# Patient Record
Sex: Female | Born: 1940
Health system: Southern US, Community
[De-identification: ages and names within clinical notes are randomized; demographics above are authoritative.]

## PROBLEM LIST (undated history)

## (undated) DIAGNOSIS — E079 Disorder of thyroid, unspecified: Secondary | ICD-10-CM

## (undated) DIAGNOSIS — E785 Hyperlipidemia, unspecified: Secondary | ICD-10-CM

## (undated) DIAGNOSIS — J439 Emphysema, unspecified: Secondary | ICD-10-CM

## (undated) DIAGNOSIS — M1990 Unspecified osteoarthritis, unspecified site: Secondary | ICD-10-CM

## (undated) DIAGNOSIS — J45909 Unspecified asthma, uncomplicated: Secondary | ICD-10-CM

## (undated) DIAGNOSIS — F419 Anxiety disorder, unspecified: Secondary | ICD-10-CM

## (undated) DIAGNOSIS — J449 Chronic obstructive pulmonary disease, unspecified: Secondary | ICD-10-CM

## (undated) DIAGNOSIS — T7840XA Allergy, unspecified, initial encounter: Secondary | ICD-10-CM

## (undated) HISTORY — DX: Disorder of thyroid, unspecified: E07.9

## (undated) HISTORY — DX: Unspecified asthma, uncomplicated: J45.909

## (undated) HISTORY — DX: Chronic obstructive pulmonary disease, unspecified: J44.9

## (undated) HISTORY — DX: Emphysema, unspecified: J43.9

## (undated) HISTORY — DX: Allergy, unspecified, initial encounter: T78.40XA

## (undated) HISTORY — DX: Hyperlipidemia, unspecified: E78.5

## (undated) HISTORY — PX: EYE SURGERY: SHX253

## (undated) HISTORY — DX: Anxiety disorder, unspecified: F41.9

## (undated) HISTORY — DX: Unspecified osteoarthritis, unspecified site: M19.90

---

## 2013-09-16 DIAGNOSIS — H02839 Dermatochalasis of unspecified eye, unspecified eyelid: Secondary | ICD-10-CM | POA: Insufficient documentation

## 2013-09-16 DIAGNOSIS — H0230 Blepharochalasis unspecified eye, unspecified eyelid: Secondary | ICD-10-CM | POA: Insufficient documentation

## 2019-07-23 DIAGNOSIS — J449 Chronic obstructive pulmonary disease, unspecified: Secondary | ICD-10-CM | POA: Insufficient documentation

## 2019-07-23 DIAGNOSIS — J432 Centrilobular emphysema: Secondary | ICD-10-CM | POA: Insufficient documentation

## 2019-07-23 DIAGNOSIS — K5790 Diverticulosis of intestine, part unspecified, without perforation or abscess without bleeding: Secondary | ICD-10-CM | POA: Insufficient documentation

## 2019-07-23 DIAGNOSIS — E7800 Pure hypercholesterolemia, unspecified: Secondary | ICD-10-CM | POA: Insufficient documentation

## 2019-07-23 DIAGNOSIS — H903 Sensorineural hearing loss, bilateral: Secondary | ICD-10-CM | POA: Insufficient documentation

## 2019-07-23 DIAGNOSIS — K635 Polyp of colon: Secondary | ICD-10-CM | POA: Insufficient documentation

## 2019-07-23 DIAGNOSIS — E559 Vitamin D deficiency, unspecified: Secondary | ICD-10-CM | POA: Insufficient documentation

## 2019-07-23 DIAGNOSIS — F419 Anxiety disorder, unspecified: Secondary | ICD-10-CM | POA: Insufficient documentation

## 2019-07-23 DIAGNOSIS — M1990 Unspecified osteoarthritis, unspecified site: Secondary | ICD-10-CM | POA: Insufficient documentation

## 2019-07-23 DIAGNOSIS — L853 Xerosis cutis: Secondary | ICD-10-CM | POA: Insufficient documentation

## 2019-07-23 DIAGNOSIS — Q438 Other specified congenital malformations of intestine: Secondary | ICD-10-CM | POA: Insufficient documentation

## 2019-07-23 DIAGNOSIS — Z87891 Personal history of nicotine dependence: Secondary | ICD-10-CM | POA: Insufficient documentation

## 2019-07-23 DIAGNOSIS — R7309 Other abnormal glucose: Secondary | ICD-10-CM | POA: Insufficient documentation

## 2019-07-23 DIAGNOSIS — J302 Other seasonal allergic rhinitis: Secondary | ICD-10-CM | POA: Insufficient documentation

## 2020-08-23 DIAGNOSIS — M25551 Pain in right hip: Secondary | ICD-10-CM | POA: Insufficient documentation

## 2021-03-08 ENCOUNTER — Other Ambulatory Visit: Payer: Self-pay

## 2021-03-08 ENCOUNTER — Ambulatory Visit (INDEPENDENT_AMBULATORY_CARE_PROVIDER_SITE_OTHER): Payer: PPO | Admitting: Medical-Surgical

## 2021-03-08 ENCOUNTER — Encounter: Payer: Self-pay | Admitting: Medical-Surgical

## 2021-03-08 VITALS — BP 146/73 | HR 76 | Temp 98.0°F | Resp 22 | Ht 66.0 in | Wt 154.0 lb

## 2021-03-08 DIAGNOSIS — J432 Centrilobular emphysema: Secondary | ICD-10-CM | POA: Diagnosis not present

## 2021-03-08 DIAGNOSIS — J4540 Moderate persistent asthma, uncomplicated: Secondary | ICD-10-CM | POA: Diagnosis not present

## 2021-03-08 DIAGNOSIS — F419 Anxiety disorder, unspecified: Secondary | ICD-10-CM

## 2021-03-08 DIAGNOSIS — Z7689 Persons encountering health services in other specified circumstances: Secondary | ICD-10-CM | POA: Diagnosis not present

## 2021-03-08 DIAGNOSIS — E890 Postprocedural hypothyroidism: Secondary | ICD-10-CM | POA: Diagnosis not present

## 2021-03-08 DIAGNOSIS — Z13820 Encounter for screening for osteoporosis: Secondary | ICD-10-CM | POA: Insufficient documentation

## 2021-03-08 MED ORDER — BUDESONIDE-FORMOTEROL FUMARATE 160-4.5 MCG/ACT IN AERO: 2.0000 | INHALATION_SPRAY | Freq: Two times a day (BID) | RESPIRATORY_TRACT | 12 refills | Status: DC

## 2021-03-08 NOTE — Progress Notes (Signed)
New Patient Office Visit  Subjective:  Patient ID: Christine Barry, female    DOB: 05-16-1941  Age: 80 y.o. MRN: [id number]  CC:  Chief Complaint  Patient presents with  . Establish Care    HPI Christine Barry presents to establish care.   History of partial thyroidectomy in her teens due to goiter. Was started on levothyroxine in 1987 and has been on it since. Currently taking 200mg  daily except for the 2nd and 4th Sunday of each month. Last check of her TSH in March of this year.   Emphysema/asthma- well controlled on Dulera and as needed Albuterol. Was followed by Pulmonology before her relocation and would like a new referral now that she lives here. Notes that her insurance here will not cover Dulera and would like to be switched back to Symbicort. She has used this before and it was effective.   Anxiety- takes 0.125mg  of Xanax sparingly for severe anxiety. Has some family issues that cause significant stress. Reports her son has mental health struggles and has attempted suicide several times. He is here with her and she is helping him with his affairs.   History reviewed. No pertinent past medical history.  History reviewed. No pertinent surgical history.  History reviewed. No pertinent family history.  Social History   Socioeconomic History  . Marital status: Widowed    Spouse name: Not on file  . Number of children: Not on file  . Years of education: Not on file  . Highest education level: Not on file  Occupational History  . Not on file  Tobacco Use  . Smoking status: Former Smoker    Types: Cigarettes  . Smokeless tobacco: Never Used  Substance and Sexual Activity  . Alcohol use: Not on file  . Drug use: Not on file  . Sexual activity: Not on file  Other Topics Concern  . Not on file  Social History Narrative  . Not on file   Social Determinants of Health   Financial Resource Strain: Not on file  Food Insecurity: Not on file  Transportation  Needs: Not on file  Physical Activity: Not on file  Stress: Not on file  Social Connections: Not on file  Intimate Partner Violence: Not on file    ROS Review of Systems  Constitutional: Negative for chills, fatigue, fever and unexpected weight change.  Respiratory: Positive for cough, chest tightness and shortness of breath. Negative for wheezing.   Cardiovascular: Negative for chest pain, palpitations and leg swelling.  Neurological: Negative for dizziness, light-headedness and headaches.  Psychiatric/Behavioral: Negative for dysphoric mood, self-injury, sleep disturbance and suicidal ideas. The patient is nervous/anxious.     Objective:   Today's Vitals: BP (!) 146/73 (BP Location: Left Arm, Patient Position: Sitting, Cuff Size: Normal)   Pulse 76   Temp 98 F (36.7 C) (Oral)   Resp (!) 22   Ht 5\' 6"  (1.676 m)   Wt 154 lb (69.9 kg)   SpO2 90%   BMI 24.86 kg/m   Physical Exam Vitals reviewed.  Constitutional:      General: She is not in acute distress.    Appearance: Normal appearance.  HENT:     Head: Normocephalic and atraumatic.  Cardiovascular:     Rate and Rhythm: Normal rate and regular rhythm.     Pulses: Normal pulses.     Heart sounds: Normal heart sounds. No murmur heard. No friction rub. No gallop.   Pulmonary:     Effort: Pulmonary effort  is normal. No respiratory distress.     Breath sounds: Normal breath sounds. No wheezing.  Skin:    General: Skin is warm and dry.  Neurological:     Mental Status: She is alert and oriented to person, place, and time.  Psychiatric:        Mood and Affect: Mood normal.        Behavior: Behavior normal.        Thought Content: Thought content normal.        Judgment: Judgment normal.     Assessment & Plan:   1. Encounter to establish care Reviewed available information and discussed care concerns with patient. She is up to date on preventative care and will be due for follow up in about 3 months.  2.  Centrilobular emphysema (HCC) 3. Moderate persistent asthma without complication Referring to pulmonology. New prescription sent in for Symbicort to replace [facility]. - Ambulatory referral to Pulmonology  4. Postoperative hypothyroidism Continue current levothyroxine dosing. Will be due for recheck in September.   5. Anxiety Continue sparing use of Xanax for severe anxiety. Discussed possibly referring to a counselor. She doesn't feel like she needs to do that at this point but will let me know once things settle down a bit.   Outpatient Encounter Medications as of 03/08/2021  Medication Sig  . albuterol (VENTOLIN HFA) 108 (90 Base) MCG/ACT inhaler Inhale 180 each into the lungs 4 (four) times daily as needed.  . ALPRAZolam (XANAX) 0.25 MG tablet Take 0.25 mg by mouth daily as needed.  . budesonide-formoterol (SYMBICORT) 160-4.5 MCG/ACT inhaler Inhale 2 puffs into the lungs in the morning and at bedtime.  . cetirizine (ZYRTEC) 10 MG tablet Take 10 mg by mouth daily as needed.  . Cholecalciferol 50 MCG (2000 UT) TABS Take 2,000 Units by mouth daily.  . cyanocobalamin 1000 MCG tablet Take 1,000 mcg by mouth 3 (three) times a week.  . EUTHYROX 200 MCG tablet Take 200 mcg by mouth in the morning and at bedtime. Take one tablet by mouth once daily in the morning before breakfast except for 2nd and 4th Sundays of the month.  . Ibuprofen 200 MG CAPS Take 200 mg by mouth daily as needed.  . simvastatin (ZOCOR) 20 MG tablet Take 20 mg by mouth at bedtime.  . [DISCONTINUED] DULERA 200-5 MCG/ACT AERO Inhale 2 puffs into the lungs in the morning and at bedtime.   No facility-administered encounter medications on file as of 03/08/2021.    Follow-up: Return in about 3 months (around 06/08/2021) for chronic disease follow up.   Thayer Ohm, DNP, APRN, FNP-BC [facility]

## 2021-04-11 ENCOUNTER — Ambulatory Visit (INDEPENDENT_AMBULATORY_CARE_PROVIDER_SITE_OTHER): Payer: PPO | Admitting: Medical-Surgical

## 2021-04-11 ENCOUNTER — Other Ambulatory Visit: Payer: Self-pay

## 2021-04-11 ENCOUNTER — Telehealth: Payer: Self-pay

## 2021-04-11 ENCOUNTER — Encounter: Payer: Self-pay | Admitting: Medical-Surgical

## 2021-04-11 VITALS — BP 134/64 | HR 89 | Temp 98.8°F | Ht 66.0 in | Wt 154.3 lb

## 2021-04-11 DIAGNOSIS — R0989 Other specified symptoms and signs involving the circulatory and respiratory systems: Secondary | ICD-10-CM | POA: Diagnosis not present

## 2021-04-11 NOTE — Progress Notes (Signed)
Subjective:    CC: Respiratory symptoms  HPI: Pleasant 80 year old female presenting today with complaints of upper respiratory symptoms that started about 5-6 days ago.  Notes that on Thursday, she woke up with a sore throat that persisted for 2 days.  After that she developed sinus congestion, cough, mild chest congestion, wheezing, achiness and fatigue.  She has had a low-grade temperature but no chills.  Also has had a runny nose.  She did test twice with a home COVID test with negative results.  She has a prescription for prednisone as well as azithromycin that she filled that was previously prescribed by her pulmonologist.  She did not want to start them until she had seen a provider.  Has a pulse ox at home and has been monitoring her oxygen levels with most of her readings greater than 90%.  Unfortunately, this morning she awoke with reading of 87%.  It did rebound into the 90s quickly and has been greater than 90 all day.  She has chronic mild shortness of breath and has not noticed any worsening.  Denies chest pain and GI symptoms.  I reviewed the past medical history, family history, social history, surgical history, and allergies today and no changes were needed.  Please see the problem list section below in epic for further details.  Past Medical History: Past Medical History:  Diagnosis Date   Asthma    COPD (chronic obstructive pulmonary disease) (HCC)    Hyperlipidemia    Thyroid disease    Past Surgical History: Past Surgical History:  Procedure Laterality Date   CESAREAN SECTION     Social History: Social History   Socioeconomic History   Marital status: Widowed    Spouse name: Not on file   Number of children: Not on file   Years of education: Not on file   Highest education level: Not on file  Occupational History   Occupation: Retired  Tobacco Use   Smoking status: Former    Packs/day: 2.00    Years: 38.00    Pack years: 76.00    Types: Cigarettes    Quit  date: 07/07/2000    Years since quitting: 20.7   Smokeless tobacco: Never  Vaping Use   Vaping Use: Never used  Substance and Sexual Activity   Alcohol use: Not on file   Drug use: Never   Sexual activity: Not Currently    Birth control/protection: Abstinence  Other Topics Concern   Not on file  Social History Narrative   Not on file   Social Determinants of Health   Financial Resource Strain: Not on file  Food Insecurity: Not on file  Transportation Needs: Not on file  Physical Activity: Not on file  Stress: Not on file  Social Connections: Not on file   Family History: History reviewed. No pertinent family history. Allergies: Allergies  Allergen Reactions   Influenza Vaccines Diarrhea and Other (See Comments)    Chills, redness at site, vomiting, diarrhea (occurred with 2013 flu shot)   Penicillins Other (See Comments)    Not sure (occurred in the 1970's), but thinks she "passed out"   Sulfa Antibiotics    Medications: See med rec.  Review of Systems: See HPI for pertinent positives and negatives.   Objective:    General: Well Developed, well nourished, and in no acute distress.  Neuro: Alert and oriented x3.  HEENT: Normocephalic, atraumatic.  Skin: Warm and dry. Cardiac: Regular rate and rhythm, no murmurs rubs or gallops, no lower  extremity edema.  Respiratory: Diffuse expiratory wheezes throughout all posterior lobes. Not using accessory muscles, speaking in full sentences.  Impression and Recommendations:    1. Respiratory symptoms Despite 2 home test, we will do a PCR COVID test.  Sending that off to the lab but results will not be available for a couple of days.  She is outside the 5-day symptom window so we will hold off on oral antivirals at this point.  She has the prescription for azithromycin as well as prednisone so encouraged her to go ahead and get that started.  Incentive spirometer provided and advised her to do deep breathing exercises regularly  throughout the day to help open her lungs.  Okay to use Tylenol/ibuprofen and over-the-counter medications to help with symptom management. - Novel Coronavirus, NAA ([facility])  Return in about 1 week (around 04/18/2021) for respiratory check in.  Okay to follow-up with pulmonology as scheduled but if unable to make that appointment, please come see me. ___________________________________________ Thayer Ohm, DNP, APRN, FNP-BC Primary Care and Sports Medicine [facility] [place]

## 2021-04-11 NOTE — Telephone Encounter (Signed)
Pt called and states she has a runny nose, lots of congestion, a cough, and has a hx of asthma. She is requesting an appt for evaluation and treatment. I called and LVM advising that she just needs to give our office a call back to schedule an appt with someone. Requested she call me back with any further questions or concerns.

## 2021-04-12 ENCOUNTER — Encounter: Payer: Self-pay | Admitting: Medical-Surgical

## 2021-04-12 LAB — NOVEL CORONAVIRUS, NAA: SARS-CoV-2, NAA: NOT DETECTED

## 2021-04-12 LAB — SARS-COV-2, NAA 2 DAY TAT

## 2021-04-12 LAB — SPECIMEN STATUS REPORT

## 2021-04-15 ENCOUNTER — Encounter: Payer: Self-pay | Admitting: Medical-Surgical

## 2021-04-17 ENCOUNTER — Other Ambulatory Visit: Payer: Self-pay

## 2021-04-17 ENCOUNTER — Encounter: Payer: Self-pay | Admitting: Medical-Surgical

## 2021-04-17 ENCOUNTER — Other Ambulatory Visit: Payer: Self-pay | Admitting: Medical-Surgical

## 2021-04-17 DIAGNOSIS — E890 Postprocedural hypothyroidism: Secondary | ICD-10-CM

## 2021-04-17 DIAGNOSIS — R0989 Other specified symptoms and signs involving the circulatory and respiratory systems: Secondary | ICD-10-CM

## 2021-04-17 MED ORDER — EUTHYROX 200 MCG PO TABS: ORAL_TABLET | ORAL | 0 refills | Status: DC

## 2021-04-17 MED ORDER — EUTHYROX 200 MCG PO TABS: 200.0000 ug | ORAL_TABLET | Freq: Two times a day (BID) | ORAL | 0 refills | Status: DC

## 2021-04-18 ENCOUNTER — Other Ambulatory Visit: Payer: Self-pay

## 2021-04-18 ENCOUNTER — Ambulatory Visit (INDEPENDENT_AMBULATORY_CARE_PROVIDER_SITE_OTHER): Payer: PPO

## 2021-04-18 DIAGNOSIS — R0989 Other specified symptoms and signs involving the circulatory and respiratory systems: Secondary | ICD-10-CM

## 2021-04-18 DIAGNOSIS — R059 Cough, unspecified: Secondary | ICD-10-CM

## 2021-04-19 ENCOUNTER — Ambulatory Visit: Payer: PPO | Admitting: Pulmonary Disease

## 2021-04-19 ENCOUNTER — Encounter: Payer: Self-pay | Admitting: Pulmonary Disease

## 2021-04-19 VITALS — BP 150/70 | HR 84 | Temp 97.7°F | Ht 66.0 in | Wt 154.0 lb

## 2021-04-19 DIAGNOSIS — J441 Chronic obstructive pulmonary disease with (acute) exacerbation: Secondary | ICD-10-CM | POA: Diagnosis not present

## 2021-04-19 MED ORDER — PREDNISONE 10 MG PO TABS: ORAL_TABLET | ORAL | 0 refills | Status: AC

## 2021-04-19 MED ORDER — IPRATROPIUM-ALBUTEROL 0.5-2.5 (3) MG/3ML IN SOLN: 3.0000 mL | RESPIRATORY_TRACT | 6 refills | Status: DC | PRN

## 2021-04-19 MED ORDER — DOXYCYCLINE HYCLATE 100 MG PO TABS: 100.0000 mg | ORAL_TABLET | Freq: Two times a day (BID) | ORAL | 0 refills | Status: AC

## 2021-04-19 NOTE — Patient Instructions (Addendum)
Severe COPD/asthma overlap --INCREASE Symbicort TWO puffs THREE times a day until we can obtain your nebulizer --When nebulizer arrives, resume Symbicort TWO puffs TWICE a day --START DUONEBs nebulizer 2-4 times day for shortness of breath or wheezing  --START second of prednisone --START doxycycline x 5 days --Continue Mucinex twice a day x 5 days. Samples provided  Please go to the ED if your oxygen level <88% for >5 minutes despite treatment.  Follow-up with me next week

## 2021-04-19 NOTE — Progress Notes (Signed)
Subjective:   PATIENT ID: Christine Barry GENDER: female DOB: July 05, 1941, MRN: [id number]   HPI  Chief Complaint  Patient presents with   Consult    Pt is being referred by PCP. Pt states that she currently has complaints of either bronchitis/asthma coughing up clear phlegm with some yellow tinge to it, wheezing, and SOB with exertion.    Reason for Visit: New consult for COPD  Christine Barry is a 80 year old female former smoker  She was diagnosed with COPD in 2016 and followed by a Pulmonogist, Dr. Wilma Flavin in [place]. She moved to be near her daughter in [place], [place]. At baseline, she is active and enjoys going to the farmer's market weekly. Denies coughing shortness of breath and wheezing. She is compliant with her bronchodilators with Dulera and rarely used her albuterol. In 2021 she had a prolonged exacerbation requiring 2-3 months of steroids.  Two weeks she developed sore throat followed by nasal congestion and chest congestion. She was seen by her PCP one week after her symptoms. She has tested negative for COVID-19 x 3 including PCR test in-clinic. She was treated with prednisone and azithromycin which was started on the 7/5 and despite treatment she continues to have cough, wheezing and shortness of breath. Cough is occasionally productive with yellowish brown sputum. Difficulty sleeping. Currently on Symbicort twice a day. Uses albuterol 3-4 times a day. Denies fevers, hemoptysis. Has been taking mucinex as needed.  Social History: Former smoker. Quit in 2001. 76 pack-years  I have personally reviewed patient's past medical/family/social history, allergies, current medications.  Past Medical History:  Diagnosis Date   Asthma    COPD (chronic obstructive pulmonary disease) (HCC)    Hyperlipidemia    Thyroid disease      Family History  Problem Relation Age of Onset   Lung disease Father      Social History   Occupational History   Occupation: Retired   Tobacco Use   Smoking status: Former    Packs/day: 2.00    Years: 38.00    Pack years: 76.00    Types: Cigarettes    Quit date: 07/07/2000    Years since quitting: 20.7   Smokeless tobacco: Never  Vaping Use   Vaping Use: Never used  Substance and Sexual Activity   Alcohol use: Not on file   Drug use: Never   Sexual activity: Not Currently    Birth control/protection: Abstinence    Allergies  Allergen Reactions   Influenza Vaccines Diarrhea and Other (See Comments)    Chills, redness at site, vomiting, diarrhea (occurred with 2013 flu shot)   Penicillins Other (See Comments)    Not sure (occurred in the 1970's), but thinks she "passed out"   Sulfa Antibiotics      Outpatient Medications Prior to Visit  Medication Sig Dispense Refill   albuterol (VENTOLIN HFA) 108 (90 Base) MCG/ACT inhaler Inhale 180 each into the lungs 4 (four) times daily as needed.     budesonide-formoterol (SYMBICORT) 160-4.5 MCG/ACT inhaler Inhale 2 puffs into the lungs in the morning and at bedtime. 1 each 12   cetirizine (ZYRTEC) 10 MG tablet Take 10 mg by mouth daily as needed.     Cholecalciferol 50 MCG (2000 UT) TABS Take 2,000 Units by mouth daily.     cyanocobalamin 1000 MCG tablet Take 1,000 mcg by mouth 3 (three) times a week.     EUTHYROX 200 MCG tablet Take one tablet by mouth once daily in  the morning before breakfast except for 2nd and 4th Sundays of the month. 180 tablet 0   Ibuprofen 200 MG CAPS Take 200 mg by mouth daily as needed.     simvastatin (ZOCOR) 20 MG tablet Take 20 mg by mouth at bedtime.     No facility-administered medications prior to visit.    Review of Systems  Constitutional:  Negative for chills, diaphoresis, fever, malaise/fatigue and weight loss.  HENT:  Positive for congestion. Negative for ear pain and sore throat.   Respiratory:  Positive for cough, sputum production, shortness of breath and wheezing. Negative for hemoptysis.   Cardiovascular:  Negative for  chest pain, palpitations and leg swelling.  Gastrointestinal:  Negative for abdominal pain, heartburn and nausea.  Genitourinary:  Negative for frequency.  Musculoskeletal:  Negative for joint pain and myalgias.  Skin:  Negative for itching and rash.  Neurological:  Negative for dizziness, weakness and headaches.  Endo/Heme/Allergies:  Does not bruise/bleed easily.  Psychiatric/Behavioral:  Negative for depression. The patient is not nervous/anxious.     Objective:   Vitals:   04/19/21 1110  BP: (!) 150/70  Pulse: 84  Temp: 97.7 F (36.5 C)  TempSrc: Oral  SpO2: 93%  Weight: 154 lb (69.9 kg)  Height: 5\' 6"  (1.676 m)      Physical Exam: General: Well-appearing, no acute distress HENT: [place], A Eyes: EOMI, no scleral icterus Respiratory: Clear to auscultation bilaterally.  No crackles, wheezing or rales Cardiovascular: RRR, -M/R/G, no JVD GI: BS+, soft, nontender Extremities:-Edema,-tenderness Neuro: AAO x4, CNII-XII grossly intact Skin: Intact, no rashes or bruising Psych: Normal mood, normal affect  Data Reviewed:  Imaging: CXR 04/18/21 - No acute infiltrate, effusion or edema  PFT: OSH 2019 FEV1 0.8. TGV 174% DLCO 49%. Positive bronchodilator response. Interpretation: Severe obstructive defect with hyperinflation and reduced gas exchange consistent with emphysema.  03/18/2020 spirometry: FEV1 0.54, 24%, FVC 1.3, ratio 0.41.   12/26/2020: FEV1 0.6, 25%, FVC 1.3, ratio 0.44.  Interpretation: Very severe obstructive defect   Labs: OSH 2016 alpha-1 MS phenotype with a level of 135  OSH 01/06/20 Absolute eos 200  Assessment & Plan:   Discussion:  Severe COPD/asthma overlap --INCREASE Symbicort TWO puffs THREE times a day until we can obtain your nebulizer --When nebulizer arrives, resume Symbicort TWO puffs TWICE a day --START DUONEBs nebulizer 2-4 times day for shortness of breath or wheezing  --START second of prednisone --START doxycycline x 5 days --Continue  Mucinex twice a day x 5 days  Please go to the ED if your oxygen level <88% for >5 minutes despite treatment.  Health Maintenance Immunization History  Administered Date(s) Administered   Influenza Split 08/02/2010, 08/09/2016, 07/15/2017, 07/18/2018   Influenza, High Dose Seasonal PF 07/07/2015, 08/10/2016, 07/23/2019   Influenza, Quadrivalent, Recombinant, Inj, Pf 07/18/2018   Influenza-Unspecified 06/26/2011, 07/21/2020   PFIZER(Purple Top)SARS-COV-2 Vaccination 11/02/2019, 11/23/2019, 07/05/2020   Pneumococcal Conjugate-13 10/08/2005, 06/21/2014, 07/18/2018, 07/22/2018   Pneumococcal Polysaccharide-23 06/04/2019   Pneumococcal-Unspecified 10/08/2005   Tdap 03/01/2018   Zoster Recombinat (Shingrix) 12/16/2017, 05/05/2018   Zoster, Live 08/02/2011   CT Lung Screen - discuss at next visit  Orders Placed This Encounter  Procedures   AMB REFERRAL FOR DME    Referral Priority:   Routine    Referral Type:   Durable Medical Equipment Purchase    Number of Visits Requested:   1   Meds ordered this encounter  Medications   predniSONE (DELTASONE) 10 MG tablet    Sig: Take 4 tablets (  40 mg total) by mouth daily with breakfast for 2 days, THEN 3 tablets (30 mg total) daily with breakfast for 2 days, THEN 2 tablets (20 mg total) daily with breakfast for 2 days, THEN 1 tablet (10 mg total) daily with breakfast for 2 days.    Dispense:  20 tablet    Refill:  0   doxycycline (VIBRA-TABS) 100 MG tablet    Sig: Take 1 tablet (100 mg total) by mouth 2 (two) times daily for 5 days.    Dispense:  10 tablet    Refill:  0   ipratropium-albuterol (DUONEB) 0.5-2.5 (3) MG/3ML SOLN    Sig: Inhale 3 mLs into the lungs every 4 (four) hours as needed.    Dispense:  360 mL    Refill:  6    J44.1    Return in about 3 months (around 07/20/2021).  I have spent a total time of 45-minutes on the day of the appointment reviewing prior documentation, coordinating care and discussing medical diagnosis and  plan with the patient/family. Imaging, labs and tests included in this note have been reviewed and interpreted independently by me.  Sallie Staron Mechele Collin, MD [facility] 04/19/2021 10:37 AM  Office Number [phone]

## 2021-04-20 ENCOUNTER — Telehealth: Payer: Self-pay | Admitting: *Deleted

## 2021-04-20 DIAGNOSIS — J441 Chronic obstructive pulmonary disease with (acute) exacerbation: Secondary | ICD-10-CM | POA: Diagnosis not present

## 2021-04-20 NOTE — Telephone Encounter (Addendum)
04/21/2021: Unable to locate PA started yesterday with Key [id number]  Attempted to start a new PA and received this message:  Brookelynne Howeth Key: [id number] - PA Case ID: [id number]  Additional Information Required There is an existing case within the [facility] environment that has the same patient, prescriber, and drug. This case must be finalized before proceeding with similar requests. Drug Ipratropium-Albuterol 0.5-2.5 (3)MG/3ML solution Form [employer] 4-Part Electronic PA Form [phone] NCPDP)  Unsure what is going on with CMM right now.  04/20/2021: PA request was received from (pharmacy): [facility] [phone] Fax: [phone] Medication name and strength: Ipratropium-Albuterol 0.5-2.5 (3)mg/28ml solution Ordering Provider: Everardo All  Was PA started with [facility]?: yes If yes, please enter KEY: [id number] Medication tried and failed: none Covered Alternatives: unknown  PA sent to plan, time frame for approval / denial: unknown Routing to Triage for follow-up

## 2021-04-21 NOTE — Telephone Encounter (Signed)
ATC, left VM for callback. 

## 2021-04-21 NOTE — Telephone Encounter (Signed)
Unable to check status of PA via CMM. No matching record with below key.  Heather, do you have the key?

## 2021-04-24 NOTE — Telephone Encounter (Signed)
Called insurance company to inquire why PA was denied.  I was transferred 3 times and never spoke with anyone to get an answer.  Will attempt another time.

## 2021-04-24 NOTE — Telephone Encounter (Signed)
Chatted with covermymeds and got it figured out, the key is [id number]. The PA was denied. Deniedon July 15 PA Case: [id number], Status: Denied. Notification: Completed.  No reason for denial, will have to contact insurance company for reason for denial.

## 2021-04-25 ENCOUNTER — Ambulatory Visit (INDEPENDENT_AMBULATORY_CARE_PROVIDER_SITE_OTHER): Payer: PPO | Admitting: Pulmonary Disease

## 2021-04-25 ENCOUNTER — Encounter: Payer: Self-pay | Admitting: Pulmonary Disease

## 2021-04-25 ENCOUNTER — Other Ambulatory Visit: Payer: Self-pay

## 2021-04-25 VITALS — BP 138/68 | HR 86 | Ht 66.0 in | Wt 153.4 lb

## 2021-04-25 DIAGNOSIS — J441 Chronic obstructive pulmonary disease with (acute) exacerbation: Secondary | ICD-10-CM | POA: Diagnosis not present

## 2021-04-25 MED ORDER — PREDNISONE 10 MG PO TABS: ORAL_TABLET | ORAL | 0 refills | Status: AC

## 2021-04-25 NOTE — Progress Notes (Signed)
Subjective:   PATIENT ID: Christine Barry GENDER: female DOB: 01-30-1941, MRN: [id number]   HPI  Chief Complaint  Patient presents with   COPD    Still having cough doesn't seem to think medication working good feel like she may need another dose of prednisone. Shortness of breath.    Reason for Visit: Follow-up COPD  [name] is a 80 year old female former smoker  Synopsis: She was diagnosed with COPD in 2016 and followed by a Pulmonogist, Dr. Wilma Barry in [place]. She moved to be near her daughter in [place], [place]. At baseline, she is active and enjoys going to the farmer's market weekly. Denies coughing shortness of breath and wheezing. She is compliant with her bronchodilators with Dulera and rarely used her albuterol. In 2021 she had a prolonged exacerbation requiring 2-3 months of steroids.  04/25/21 She established care at [facility] in 04/2021 and started treatment for COPD exacerbation. COVID-19 neg x 3 prior to her appointment. On one week follow-up today she reports one to two good days but has persistent shortness of breath, wheezing cough. During the prednisone taper her symptom seem to have recurred. Symptoms are worse in the mornings. After nebulizer treatments she will have a productive cough. Her oxygen levels have dropped to 89% at home with activity. Does not have supplemental O2. She has been compliant with Duonebs QID and symbicort twice daily.   Social History: Former smoker. Quit in 2001. 76 pack-years  Past Medical History:  Diagnosis Date   Asthma    COPD (chronic obstructive pulmonary disease) (HCC)    Hyperlipidemia    Thyroid disease      Social History   Occupational History   Occupation: Retired  Tobacco Use   Smoking status: Former    Packs/day: 2.00    Years: 38.00    Pack years: 76.00    Types: Cigarettes    Start date: 10/08/1961    Quit date: 07/07/2000    Years since quitting: 20.8   Smokeless tobacco: Never   Vaping Use   Vaping Use: Never used  Substance and Sexual Activity   Alcohol use: Not on file   Drug use: Never   Sexual activity: Not Currently    Birth control/protection: Abstinence    Allergies  Allergen Reactions   Influenza Vaccines Diarrhea and Other (See Comments)    Chills, redness at site, vomiting, diarrhea (occurred with 2013 flu shot)   Penicillins Other (See Comments)    Not sure (occurred in the 1970's), but thinks she "passed out"   Sulfa Antibiotics      Outpatient Medications Prior to Visit  Medication Sig Dispense Refill   albuterol (VENTOLIN HFA) 108 (90 Base) MCG/ACT inhaler Inhale 180 each into the lungs 4 (four) times daily as needed.     ALPRAZolam (XANAX) 0.25 MG tablet Take 0.25 mg by mouth at bedtime as needed for anxiety.     budesonide-formoterol (SYMBICORT) 160-4.5 MCG/ACT inhaler Inhale 2 puffs into the lungs in the morning and at bedtime. 1 each 12   cetirizine (ZYRTEC) 10 MG tablet Take 10 mg by mouth daily as needed.     Cholecalciferol 50 MCG (2000 UT) TABS Take 2,000 Units by mouth daily.     cyanocobalamin 1000 MCG tablet Take 1,000 mcg by mouth 3 (three) times a week.     EUTHYROX 200 MCG tablet Take one tablet by mouth once daily in the morning before breakfast except for 2nd and 4th Sundays of  the month. 180 tablet 0   Ibuprofen 200 MG CAPS Take 200 mg by mouth daily as needed.     ipratropium-albuterol (DUONEB) 0.5-2.5 (3) MG/3ML SOLN Inhale 3 mLs into the lungs every 4 (four) hours as needed. 360 mL 6   predniSONE (DELTASONE) 10 MG tablet Take 4 tablets (40 mg total) by mouth daily with breakfast for 2 days, THEN 3 tablets (30 mg total) daily with breakfast for 2 days, THEN 2 tablets (20 mg total) daily with breakfast for 2 days, THEN 1 tablet (10 mg total) daily with breakfast for 2 days. 20 tablet 0   predniSONE (STERAPRED UNI-PAK 21 TAB) 10 MG (21) TBPK tablet Take by mouth daily. Take 4tabs once daily x5 days, then 3tabs x5 days, then  2tabs x5 days, then 1tab x5 days, then stop     simvastatin (ZOCOR) 20 MG tablet Take 20 mg by mouth at bedtime.     No facility-administered medications prior to visit.    Review of Systems  Constitutional:  Negative for chills, diaphoresis, fever, malaise/fatigue and weight loss.  HENT:  Negative for congestion.   Respiratory:  Positive for cough, shortness of breath and wheezing. Negative for hemoptysis and sputum production.   Cardiovascular:  Negative for chest pain, palpitations and leg swelling.    Objective:   Vitals:   04/25/21 1219  BP: 138/68  Pulse: 86  SpO2: 91%  Weight: 153 lb 6.4 oz (69.6 kg)  Height: 5\' 6"  (1.676 m)   SpO2: 91 %  Physical Exam: General: Frail-appearing, no acute distress HENT: [place], AT Eyes: EOMI, no scleral icterus Respiratory: Diminished breath sounds bilaterally.  No crackles, wheezing or rales. Pursed lip breathing Cardiovascular: RRR, -M/R/G, no JVD Extremities:-Edema,-tenderness Neuro: AAO x4, CNII-XII grossly intact Psych: Normal mood, normal affect  Data Reviewed:  Imaging: CXR 04/18/21 - No acute infiltrate, effusion or edema  PFT: OSH 2019 FEV1 0.8. TGV 174% DLCO 49%. Positive bronchodilator response. Interpretation: Severe obstructive defect with hyperinflation and reduced gas exchange consistent with emphysema.  03/18/2020 spirometry: FEV1 0.54, 24%, FVC 1.3, ratio 0.41.   12/26/2020: FEV1 0.6, 25%, FVC 1.3, ratio 0.44.  Interpretation: Very severe obstructive defect   Labs: OSH 2016 alpha-1 MS phenotype with a level of 135  OSH 01/06/20 Absolute eos 200  Assessment & Plan:   Discussion: 79 year old female with very severe COPD (FEV1 25%) who presents with COPD exacerbation. Initially improved with bronchodilator changes and prednisone but now worsening. In-clinic ambulatory O2 with desaturations to 87% on room air. Discussed with patient ED visit however she is concerned about leaving her adult son (recent  anxiety/nervous breakdown). Will arrange for urgent oxygen delivery however I discussed the risks of delaying care. She agrees to present to ED for evaluation for worsening respiratory symptoms or oxygenation. Her son is driving her home today  Severe COPD/asthma overlap with exacerbation ---START Duonebs every 4 hours while awake  (Ex. 7:30 AM, 11:30AM, 3:30 PM, 7:30 PM, before bed) --START Flutter valve after nebulizer treatments --RESTART 3rd course of prednisone --CONTINUE Symbicort TWO puffs twice a day --CONTINUE mucinex  Go to the ED for evaluation for worsening respiratory symptoms or oxygenation.  Health Maintenance Immunization History  Administered Date(s) Administered   Influenza Split 08/02/2010, 08/09/2016, 07/15/2017, 07/18/2018   Influenza, High Dose Seasonal PF 07/07/2015, 08/10/2016, 07/23/2019   Influenza, Quadrivalent, Recombinant, Inj, Pf 07/18/2018   Influenza-Unspecified 06/26/2011, 07/21/2020   PFIZER(Purple Top)SARS-COV-2 Vaccination 11/02/2019, 11/23/2019, 07/05/2020   Pneumococcal Conjugate-13 10/08/2005, 06/21/2014, 07/18/2018, 07/22/2018  Pneumococcal Polysaccharide-23 06/04/2019   Pneumococcal-Unspecified 10/08/2005   Tdap 03/01/2018   Zoster Recombinat (Shingrix) 12/16/2017, 05/05/2018   Zoster, Live 08/02/2011   CT Lung Screen - discuss at next visit  Orders Placed This Encounter  Procedures   AMB REFERRAL FOR DME    Referral Priority:   Urgent    Referral Type:   Durable Medical Equipment Purchase    Number of Visits Requested:   1   Flutter valve    Standing Status:   Future    Standing Expiration Date:   04/25/2022   Meds ordered this encounter  Medications   predniSONE (DELTASONE) 10 MG tablet    Sig: Take 4 tablets (40 mg total) by mouth daily with breakfast for 2 days, THEN 3 tablets (30 mg total) daily with breakfast for 2 days, THEN 2 tablets (20 mg total) daily with breakfast for 2 days, THEN 1 tablet (10 mg total) daily with  breakfast for 2 days.    Dispense:  20 tablet    Refill:  0    Return in about 1 week (around 05/02/2021).  Nichola Cieslinski Mechele Collin, MD [facility] 04/25/2021 1:15 PM  Office Number [phone]

## 2021-04-25 NOTE — Telephone Encounter (Signed)
Per denial letter Iprat-albut 0.5-3(2.5) MG/3ML was denied under Medicare Part D.  However, coverage has been approved under Medicare Part A/B.  Pharmacy informed and asked to process.

## 2021-04-25 NOTE — Telephone Encounter (Signed)
Called [facility] to ask about Duoned RX and spoke with [place]. Told her we did PA and it was Denied under Medicare part D but approved under Medicare part A/B and asked if they can run it under A/B. Was informed that prescription was picked up already and patient only had to pay $5. Expressed understanding. Nothing further needed at this time.

## 2021-04-25 NOTE — Patient Instructions (Addendum)
--  START Duonebs every 4 hours while awake  (Ex. 7:30 AM, 11:30AM, 3:30 PM, 7:30 PM, before bed) --START Flutter valve after nebulizer treatments --RESTART 3rd course of prednisone --CONTINUE Symbicort TWO puffs twice a day --CONTINUE mucinex  Go to the ED for evaluation for worsening respiratory symptoms or oxygenation.  Follow-up with NP in one week

## 2021-05-02 ENCOUNTER — Telehealth: Payer: Self-pay

## 2021-05-02 NOTE — Telephone Encounter (Signed)
Dr. Maple Hudson is willing to see pt for 1 week f/u r/t last appointment with Dr. Everardo All. We did not have any NP opening in office for the rest of the week. Pt agreed to see Dr. Maple Hudson at 10am on 05/03/21.  Routing to Dr. Everardo All as Lorain Childes. Nothing further needed at this time

## 2021-05-02 NOTE — Progress Notes (Signed)
05/03/21- 80yoF former smoker (76 pk yrs)followed by Dr Everardo All for COPD last seen 04/25/21 and now seen for exacerbation in Dr George Hugh absence. Medical problem list includes Allergic Rhinitis, Moderate Persistent Asthma, COPD, Colon Polyp, Postoperative Hypothyroid,  Meds include- Ventolin hfa, Symbicort 160, Neb Duoneb, Prednisone taper from 40 mg started 7/19 O2 2L sleep/ [employer]                   Flutter valve 5x/ day Covid vax- 3 Phizer Onset URI/ Bronchitis symptoms early July, repeated neg Covid test. Rxd then Zith/ pred. ----pt.-sob better, no cough. Feeling 90% better since last wk. Her previous pulmonologist in ?[place] had suggested Trilogy. Feeling much better from recent acute exacerbation. Continues O2 2L for sleep- is comfortable and seems to help sleep better.  CXR 04/18/21-  IMPRESSION: Mild scarring or atelectasis at the lung bases  ROS-see HPI   + = positive Constitutional:    weight loss, night sweats, fevers, chills, fatigue, lassitude. HEENT:    headaches, difficulty swallowing, tooth/dental problems, sore throat,       sneezing, itching, ear ache, nasal congestion, post nasal drip, snoring CV:    chest pain, orthopnea, PND, swelling in lower extremities, anasarca,               dizziness, palpitations Resp:   +shortness of breath with exertion or at rest.                productive cough,   non-productive cough, coughing up of blood.              change in color of mucus.  +wheezing.   Skin:    rash or lesions. GI:  No-   heartburn, indigestion, abdominal pain, nausea, vomiting, diarrhea,                 change in bowel habits, loss of appetite GU: dysuria, change in color of urine, no urgency or frequency.   flank pain. MS:   joint pain, stiffness, decreased range of motion, back pain. Neuro-     nothing unusual Psych:  change in mood or affect.  depression or anxiety.   memory loss.  OBJ- Physical Exam General- Alert, Oriented, Affect-appropriate, Distress- none  acute Skin- rash-none, lesions- none, excoriation- none Lymphadenopathy- none Head- atraumatic            Eyes- Gross vision intact, PERRLA, conjunctivae and secretions clear            Ears- Hearing, canals-normal            Nose- Clear, no-Septal dev, mucus, polyps, erosion, perforation             Throat- Mallampati II , mucosa clear , drainage- none, tonsils- atrophic Neck- flexible , trachea midline, no stridor , +thyroid scar, carotid no bruit Chest - symmetrical excursion , unlabored           Heart/CV- RRR , no murmur , no gallop  , no rub, nl s1 s2                           - JVD- none , edema- none, stasis changes- none, varices- none           Lung- wheeze+expiratory, cough- none , dullness-none, rub- none                            +mildly breathless  while talking. Room air sat here 94%.           Chest wall-  Abd-  Br/ Gen/ Rectal- Not done, not indicated Extrem- cyanosis- none, clubbing, none, atrophy- none, strength- nl Neuro- grossly intact to observation

## 2021-05-02 NOTE — Telephone Encounter (Signed)
Notified pt that she was not scheduled for appointment today and instructed pt to call office to schedule appointment for next week. Nothing further needed at this time.

## 2021-05-03 ENCOUNTER — Other Ambulatory Visit: Payer: Self-pay

## 2021-05-03 ENCOUNTER — Encounter: Payer: Self-pay | Admitting: Internal Medicine

## 2021-05-03 ENCOUNTER — Ambulatory Visit: Payer: PPO | Admitting: Internal Medicine

## 2021-05-03 DIAGNOSIS — Z87891 Personal history of nicotine dependence: Secondary | ICD-10-CM

## 2021-05-03 DIAGNOSIS — J441 Chronic obstructive pulmonary disease with (acute) exacerbation: Secondary | ICD-10-CM | POA: Diagnosis not present

## 2021-05-03 MED ORDER — TRELEGY ELLIPTA 100-62.5-25 MCG/INH IN AEPB: 1.0000 | INHALATION_SPRAY | Freq: Every day | RESPIRATORY_TRACT | 0 refills | Status: DC

## 2021-05-03 NOTE — Patient Instructions (Addendum)
Order- sample x 2 Trelegy 100 inhaler   inhale 1 puff, then rinse mouth, once daily Try this instead of the Symbicort. When you run out, go back to Symbicort or call us for prescription for Trelegy if you like it better.   You will follow up with Dr Everardo All as planned in a couple of months  Please feel free to contact us if we can help

## 2021-05-10 ENCOUNTER — Encounter: Payer: Self-pay | Admitting: Internal Medicine

## 2021-05-10 NOTE — Assessment & Plan Note (Signed)
Recovering well from recent exacerbation Plan- finish prednisone taper, sample Trelegy 100 to compare with Symbicort

## 2021-05-10 NOTE — Assessment & Plan Note (Signed)
Remains abstinent 

## 2021-05-10 NOTE — Assessment & Plan Note (Signed)
>>  ASSESSMENT AND PLAN FOR CHRONIC OBSTRUCTIVE PULMONARY DISEASE (HCC) WRITTEN ON 05/10/2021  9:43 AM BY YOUNG, CLINTON D, MD  Recovering well from recent exacerbation Plan- finish prednisone  taper, sample Trelegy 100 to compare with Symbicort

## 2021-05-26 ENCOUNTER — Telehealth: Payer: Self-pay | Admitting: Pulmonary Disease

## 2021-05-26 DIAGNOSIS — J441 Chronic obstructive pulmonary disease with (acute) exacerbation: Secondary | ICD-10-CM | POA: Diagnosis not present

## 2021-05-26 MED ORDER — TRELEGY ELLIPTA 100-62.5-25 MCG/INH IN AEPB: 1.0000 | INHALATION_SPRAY | Freq: Every day | RESPIRATORY_TRACT | 6 refills | Status: DC

## 2021-05-26 NOTE — Telephone Encounter (Signed)
Called and spoke with patient to let her know that RX has been sent to preferred pharmacy. She expressed understanding. Nothing further needed at this time.  

## 2021-05-26 NOTE — Telephone Encounter (Signed)
ATC patient, LVM to return call.  Trelegy 100 script sent to [facility] pharmacy on [address] as requested.

## 2021-06-08 ENCOUNTER — Other Ambulatory Visit: Payer: Self-pay

## 2021-06-08 ENCOUNTER — Encounter: Payer: Self-pay | Admitting: Medical-Surgical

## 2021-06-08 ENCOUNTER — Ambulatory Visit (INDEPENDENT_AMBULATORY_CARE_PROVIDER_SITE_OTHER): Payer: PPO | Admitting: Medical-Surgical

## 2021-06-08 VITALS — BP 136/75 | HR 74 | Resp 20 | Ht 66.0 in | Wt 158.0 lb

## 2021-06-08 DIAGNOSIS — E890 Postprocedural hypothyroidism: Secondary | ICD-10-CM

## 2021-06-08 DIAGNOSIS — J4540 Moderate persistent asthma, uncomplicated: Secondary | ICD-10-CM | POA: Diagnosis not present

## 2021-06-08 DIAGNOSIS — F419 Anxiety disorder, unspecified: Secondary | ICD-10-CM | POA: Diagnosis not present

## 2021-06-08 DIAGNOSIS — E559 Vitamin D deficiency, unspecified: Secondary | ICD-10-CM

## 2021-06-08 DIAGNOSIS — Z0000 Encounter for general adult medical examination without abnormal findings: Secondary | ICD-10-CM | POA: Diagnosis not present

## 2021-06-08 DIAGNOSIS — J432 Centrilobular emphysema: Secondary | ICD-10-CM | POA: Diagnosis not present

## 2021-06-08 DIAGNOSIS — E7800 Pure hypercholesterolemia, unspecified: Secondary | ICD-10-CM | POA: Diagnosis not present

## 2021-06-08 MED ORDER — SIMVASTATIN 20 MG PO TABS: 20.0000 mg | ORAL_TABLET | Freq: Every day | ORAL | 1 refills | Status: DC

## 2021-06-08 NOTE — Progress Notes (Signed)
  HPI with pertinent ROS:   CC: Chronic disease follow-up  HPI: Pleasant 80 year old female presenting today for the following:  Emphysema/COPD/asthma-follow-up pulmonology. She is doing fairly well after her month-long respiratory illness and has gotten back to her regular activities.  She is using Trelegy now per pulmonary recommendations and feels that it is doing as much as the Symbicort and albuterol did.  She notes that she does not feel greatly improved however it is as effective as the others and she will go ahead and continue because of its convenience.  Postsurgical hypothyroidism- taking Euthyrox 200 mcg daily as prescribed, tolerating well without side effects.  Due for recheck of TSH today.  Anxiety-notes that her son is gotten a lot better and that she is doing fairly well on this.  She is managing her anxiety well overall although there are times that she has difficulty sleeping at night due to racing thoughts and worry.  She uses Xanax 0.25 mg tablets, half tablet at bedtime as needed approximately twice monthly.  HLD-taking simvastatin 20 mg daily, tolerating well without side effects.  I reviewed the past medical history, family history, social history, surgical history, and allergies today and no changes were needed.  Please see the problem list section below in epic for further details.   Physical exam:   General: Well Developed, well nourished, and in no acute distress.  Neuro: Alert and oriented x3, extra-ocular muscles intact, sensation grossly intact.  HEENT: Normocephalic, atraumatic, pupils equal round reactive to light, neck supple, no masses, no lymphadenopathy, thyroid nonpalpable.  Skin: Warm and dry, no rashes. Cardiac: Regular rate and rhythm, no murmurs rubs or gallops, no lower extremity edema.  Respiratory: Clear to auscultation bilaterally. Not using accessory muscles, speaking in full sentences.  Impression and Recommendations:    1. Moderate  persistent asthma without complication 2. Centrilobular emphysema (HCC) Followed by pulmonology.  She is doing fairly well after her month-long respiratory illness and has gotten back to her regular activities.  She is using Trelegy now per pulmonary recommendations and feels that it is doing as much as the Symbicort and albuterol did.  She notes that she does not feel greatly improved however it is as effective as the others and she will go ahead and continue because of its convenience.  3. Postoperative hypothyroidism Checking TSH today.  Continue Euthyrox 200 mcg daily but may change depending on TSH results. - TSH  4. Anxiety Continue Xanax 0.25 mg nightly as needed.  Reviewed recommendation for very sparing use due to risk of physical and psychological dependence.  5. Elevated cholesterol Checking lipid panel today.  Continue simvastatin 20 mg daily.  Refill sent to pharmacy. - Lipid panel  6. Preventative health care Checking CBC with differential and CMP today. - CBC with Differential/Platelet - COMPLETE METABOLIC PANEL WITH GFR  Return in about 6 months (around 12/06/2021) for chronic disease follow up. ___________________________________________ Thayer Ohm, DNP, APRN, FNP-BC Primary Care and Sports Medicine [facility]

## 2021-06-09 ENCOUNTER — Other Ambulatory Visit: Payer: Self-pay | Admitting: Medical-Surgical

## 2021-06-09 LAB — COMPLETE METABOLIC PANEL WITH GFR
AG Ratio: 1.9 (calc) (ref 1.0–2.5)
ALT: 14 U/L (ref 6–29)
AST: 18 U/L (ref 10–35)
Albumin: 4.1 g/dL (ref 3.6–5.1)
Alkaline phosphatase (APISO): 51 U/L (ref 37–153)
BUN: 23 mg/dL (ref 7–25)
CO2: 31 mmol/L (ref 20–32)
Calcium: 9.2 mg/dL (ref 8.6–10.4)
Chloride: 102 mmol/L (ref 98–110)
Creat: 0.67 mg/dL (ref 0.60–1.00)
Globulin: 2.2 g/dL (calc) (ref 1.9–3.7)
Glucose, Bld: 82 mg/dL (ref 65–99)
Potassium: 4.8 mmol/L (ref 3.5–5.3)
Sodium: 140 mmol/L (ref 135–146)
Total Bilirubin: 0.4 mg/dL (ref 0.2–1.2)
Total Protein: 6.3 g/dL (ref 6.1–8.1)
eGFR: 89 mL/min/{1.73_m2} (ref >=60)

## 2021-06-09 LAB — CBC WITH DIFFERENTIAL/PLATELET
Absolute Monocytes: 735 cells/uL (ref 200–950)
Basophils Absolute: 63 cells/uL (ref 0–200)
Basophils Relative: 0.9 %
Eosinophils Absolute: 560 cells/uL — ABNORMAL HIGH (ref 15–500)
Eosinophils Relative: 8 %
HCT: 43 % (ref 35.0–45.0)
Hemoglobin: 13.5 g/dL (ref 11.7–15.5)
Lymphs Abs: 1106 cells/uL (ref 850–3900)
MCH: 29.1 pg (ref 27.0–33.0)
MCHC: 31.4 g/dL — ABNORMAL LOW (ref 32.0–36.0)
MCV: 92.7 fL (ref 80.0–100.0)
MPV: 14.5 fL — ABNORMAL HIGH (ref 7.5–12.5)
Monocytes Relative: 10.5 %
Neutro Abs: 4536 cells/uL (ref 1500–7800)
Neutrophils Relative %: 64.8 %
Platelets: 154 10*3/uL (ref 140–400)
RBC: 4.64 10*6/uL (ref 3.80–5.10)
RDW: 13.1 % (ref 11.0–15.0)
Total Lymphocyte: 15.8 %
WBC: 7 10*3/uL (ref 3.8–10.8)

## 2021-06-09 LAB — LIPID PANEL
Cholesterol: 206 mg/dL — ABNORMAL HIGH (ref <200)
HDL: 72 mg/dL (ref >=50)
LDL Cholesterol (Calc): 106 mg/dL (calc) — ABNORMAL HIGH
Non-HDL Cholesterol (Calc): 134 mg/dL (calc) — ABNORMAL HIGH (ref <130)
Total CHOL/HDL Ratio: 2.9 (calc) (ref <5.0)
Triglycerides: 168 mg/dL — ABNORMAL HIGH (ref <150)

## 2021-06-09 LAB — TSH: TSH: 0.9 mIU/L (ref 0.40–4.50)

## 2021-06-19 ENCOUNTER — Encounter: Payer: Self-pay | Admitting: Medical-Surgical

## 2021-06-19 NOTE — Telephone Encounter (Signed)
Dr. Everardo All please advise on patient mychart message. Thank you    I've had my covid shot and two boosters, last one on Sept 28, 2021. Should I get the regular booster now or wait for the omicron variant booster?

## 2021-06-26 DIAGNOSIS — J441 Chronic obstructive pulmonary disease with (acute) exacerbation: Secondary | ICD-10-CM | POA: Diagnosis not present

## 2021-07-05 ENCOUNTER — Ambulatory Visit (INDEPENDENT_AMBULATORY_CARE_PROVIDER_SITE_OTHER): Payer: PPO

## 2021-07-05 ENCOUNTER — Other Ambulatory Visit: Payer: Self-pay

## 2021-07-05 DIAGNOSIS — Z1211 Encounter for screening for malignant neoplasm of colon: Secondary | ICD-10-CM

## 2021-07-05 DIAGNOSIS — Z1231 Encounter for screening mammogram for malignant neoplasm of breast: Secondary | ICD-10-CM | POA: Diagnosis not present

## 2021-07-05 DIAGNOSIS — Z0000 Encounter for general adult medical examination without abnormal findings: Secondary | ICD-10-CM

## 2021-07-05 NOTE — Patient Instructions (Signed)
Christine Barry , Thank you for taking time to come for your Medicare Wellness Visit. I appreciate your ongoing commitment to your health goals. Please review the following plan we discussed and let me know if I can assist you in the future.   Screening recommendations/referrals: Colonoscopy: pt stated completed in 2020 will need a follow up Mammogram: order placed 07/05/21 Bone Density: Done 12/09/18 repeat every 2 years  Recommended yearly ophthalmology/optometry visit for glaucoma screening and checkup Recommended yearly dental visit for hygiene and checkup  Vaccinations: Influenza vaccine: Due and discussed Pneumococcal vaccine: Completed  Tdap vaccine: Done 03/01/18 repeat every 10 years  Shingles vaccine: Complete 3/11 & 05/05/18 Covid-19:Completed 1/28, 2/15, 07/05/20 & 07/01/21  Advanced directives: Please bring a copy of your health care power of attorney and living will to the office at your convenience.  Conditions/risks identified: Notes Live and be mobile until the day I die  Next appointment: Follow up in one year for your annual wellness visit    Preventive Care 65 Years and Older, Female Preventive care refers to lifestyle choices and visits with your health care provider that can promote health and wellness. What does preventive care include? A yearly physical exam. This is also called an annual well check. Dental exams once or twice a year. Routine eye exams. Ask your health care provider how often you should have your eyes checked. Personal lifestyle choices, including: Daily care of your teeth and gums. Regular physical activity. Eating a healthy diet. Avoiding tobacco and drug use. Limiting alcohol use. Practicing safe sex. Taking low-dose aspirin every day. Taking vitamin and mineral supplements as recommended by your health care provider. What happens during an annual well check? The services and screenings done by your health care provider during your annual well  check will depend on your age, overall health, lifestyle risk factors, and family history of disease. Counseling  Your health care provider may ask you questions about your: Alcohol use. Tobacco use. Drug use. Emotional well-being. Home and relationship well-being. Sexual activity. Eating habits. History of falls. Memory and ability to understand (cognition). Work and work Astronomer. Reproductive health. Screening  You may have the following tests or measurements: Height, weight, and BMI. Blood pressure. Lipid and cholesterol levels. These may be checked every 5 years, or more frequently if you are over 27 years old. Skin check. Lung cancer screening. You may have this screening every year starting at age 36 if you have a 30-pack-year history of smoking and currently smoke or have quit within the past 15 years. Fecal occult blood test (FOBT) of the stool. You may have this test every year starting at age 31. Flexible sigmoidoscopy or colonoscopy. You may have a sigmoidoscopy every 5 years or a colonoscopy every 10 years starting at age 57. Hepatitis C blood test. Hepatitis B blood test. Sexually transmitted disease (STD) testing. Diabetes screening. This is done by checking your blood sugar (glucose) after you have not eaten for a while (fasting). You may have this done every 1-3 years. Bone density scan. This is done to screen for osteoporosis. You may have this done starting at age 61. Mammogram. This may be done every 1-2 years. Talk to your health care provider about how often you should have regular mammograms. Talk with your health care provider about your test results, treatment options, and if necessary, the need for more tests. Vaccines  Your health care provider may recommend certain vaccines, such as: Influenza vaccine. This is recommended every year. Tetanus,  diphtheria, and acellular pertussis (Tdap, Td) vaccine. You may need a Td booster every 10 years. Zoster  vaccine. You may need this after age 62. Pneumococcal 13-valent conjugate (PCV13) vaccine. One dose is recommended after age 79. Pneumococcal polysaccharide (PPSV23) vaccine. One dose is recommended after age 78. Talk to your health care provider about which screenings and vaccines you need and how often you need them. This information is not intended to replace advice given to you by your health care provider. Make sure you discuss any questions you have with your health care provider. Document Released: 10/21/2015 Document Revised: 06/13/2016 Document Reviewed: 07/26/2015 Elsevier Interactive Patient Education  2017 [place] Prevention in the Home Falls can cause injuries. They can happen to people of all ages. There are many things you can do to make your home safe and to help prevent falls. What can I do on the outside of my home? Regularly fix the edges of walkways and driveways and fix any cracks. Remove anything that might make you trip as you walk through a door, such as a raised step or threshold. Trim any bushes or trees on the path to your home. Use bright outdoor lighting. Clear any walking paths of anything that might make someone trip, such as rocks or tools. Regularly check to see if handrails are loose or broken. Make sure that both sides of any steps have handrails. Any raised decks and porches should have guardrails on the edges. Have any leaves, snow, or ice cleared regularly. Use sand or salt on walking paths during winter. Clean up any spills in your garage right away. This includes oil or grease spills. What can I do in the bathroom? Use night lights. Install grab bars by the toilet and in the tub and shower. Do not use towel bars as grab bars. Use non-skid mats or decals in the tub or shower. If you need to sit down in the shower, use a plastic, non-slip stool. Keep the floor dry. Clean up any water that spills on the floor as soon as it happens. Remove  soap buildup in the tub or shower regularly. Attach bath mats securely with double-sided non-slip rug tape. Do not have throw rugs and other things on the floor that can make you trip. What can I do in the bedroom? Use night lights. Make sure that you have a light by your bed that is easy to reach. Do not use any sheets or blankets that are too big for your bed. They should not hang down onto the floor. Have a firm chair that has side arms. You can use this for support while you get dressed. Do not have throw rugs and other things on the floor that can make you trip. What can I do in the kitchen? Clean up any spills right away. Avoid walking on wet floors. Keep items that you use a lot in easy-to-reach places. If you need to reach something above you, use a strong step stool that has a grab bar. Keep electrical cords out of the way. Do not use floor polish or wax that makes floors slippery. If you must use wax, use non-skid floor wax. Do not have throw rugs and other things on the floor that can make you trip. What can I do with my stairs? Do not leave any items on the stairs. Make sure that there are handrails on both sides of the stairs and use them. Fix handrails that are broken or loose. Make  sure that handrails are as long as the stairways. Check any carpeting to make sure that it is firmly attached to the stairs. Fix any carpet that is loose or worn. Avoid having throw rugs at the top or bottom of the stairs. If you do have throw rugs, attach them to the floor with carpet tape. Make sure that you have a light switch at the top of the stairs and the bottom of the stairs. If you do not have them, ask someone to add them for you. What else can I do to help prevent falls? Wear shoes that: Do not have high heels. Have rubber bottoms. Are comfortable and fit you well. Are closed at the toe. Do not wear sandals. If you use a stepladder: Make sure that it is fully opened. Do not climb a  closed stepladder. Make sure that both sides of the stepladder are locked into place. Ask someone to hold it for you, if possible. Clearly mark and make sure that you can see: Any grab bars or handrails. First and last steps. Where the edge of each step is. Use tools that help you move around (mobility aids) if they are needed. These include: Canes. Walkers. Scooters. Crutches. Turn on the lights when you go into a dark area. Replace any light bulbs as soon as they burn out. Set up your furniture so you have a clear path. Avoid moving your furniture around. If any of your floors are uneven, fix them. If there are any pets around you, be aware of where they are. Review your medicines with your doctor. Some medicines can make you feel dizzy. This can increase your chance of falling. Ask your doctor what other things that you can do to help prevent falls. This information is not intended to replace advice given to you by your health care provider. Make sure you discuss any questions you have with your health care provider. Document Released: 07/21/2009 Document Revised: 03/01/2016 Document Reviewed: 10/29/2014 Elsevier Interactive Patient Education  2017 [employer].

## 2021-07-05 NOTE — Progress Notes (Addendum)
Virtual Visit via Telephone Note  I connected with  Christine Barry on 07/05/21 at  5:00 PM EDT by telephone and verified that I am speaking with the correct person using two identifiers.  Medicare Annual Wellness visit completed telephonically due to Covid-19 pandemic.   Persons participating in this call: This Health Coach and this patient.   Location: Patient: Home Provider: Office    I discussed the limitations, risks, security and privacy concerns of performing an evaluation and management service by telephone and the availability of in person appointments. The patient expressed understanding and agreed to proceed.  Unable to perform video visit due to video visit attempted and failed and/or patient does not have video capability.   Some vital signs may be absent or patient reported.   Marzella Schlein, LPN   Subjective:   Christine Barry is a 80 y.o. female who presents for an Initial Medicare Annual Wellness Visit.  Review of Systems     Cardiac Risk Factors include: advanced age (>64men, >70 women)     Objective:    Today's Vitals   07/05/21 1655  PainSc: 5    There is no height or weight on file to calculate BMI.  Advanced Directives 07/05/2021 03/08/2021  Does Patient Have a Medical Advance Directive? Yes Yes  Type of Advance Directive Healthcare Power of Attorney Living will  Does patient want to make changes to medical advance directive? - No - Patient declined  Copy of Healthcare Power of Attorney in Chart? No - copy requested -    Current Medications (verified) Outpatient Encounter Medications as of 07/05/2021  Medication Sig   albuterol (VENTOLIN HFA) 108 (90 Base) MCG/ACT inhaler Inhale 180 each into the lungs 4 (four) times daily as needed.   ALPRAZolam (XANAX) 0.25 MG tablet Take 0.25 mg by mouth at bedtime as needed for anxiety.   cetirizine (ZYRTEC) 10 MG tablet Take 10 mg by mouth daily as needed.   Cholecalciferol 50 MCG (2000 UT) TABS  Take 2,000 Units by mouth daily.   cyanocobalamin 1000 MCG tablet Take 1,000 mcg by mouth 3 (three) times a week.   EUTHYROX 200 MCG tablet Take one tablet by mouth once daily in the morning before breakfast except for 2nd and 4th Sundays of the month.   Ferrous Sulfate (IRON PO) Take by mouth. Nature made   Fluticasone-Umeclidin-Vilant (TRELEGY ELLIPTA) 100-62.5-25 MCG/INH AEPB Inhale 1 puff into the lungs daily.   Ibuprofen 200 MG CAPS Take 200 mg by mouth daily as needed.   simvastatin (ZOCOR) 20 MG tablet Take 1 tablet (20 mg total) by mouth at bedtime.   [DISCONTINUED] ipratropium-albuterol (DUONEB) 0.5-2.5 (3) MG/3ML SOLN Inhale 3 mLs into the lungs every 4 (four) hours as needed. (Patient not taking: Reported on 07/05/2021)   No facility-administered encounter medications on file as of 07/05/2021.    Allergies (verified) Penicillins and Sulfa antibiotics   History: Past Medical History:  Diagnosis Date   Asthma    COPD (chronic obstructive pulmonary disease) (HCC)    Hyperlipidemia    Thyroid disease    Past Surgical History:  Procedure Laterality Date   CESAREAN SECTION     Family History  Problem Relation Age of Onset   Lung disease Father    Social History   Socioeconomic History   Marital status: Widowed    Spouse name: Not on file   Number of children: Not on file   Years of education: Not on file   Highest education  level: Not on file  Occupational History   Occupation: Retired  Tobacco Use   Smoking status: Former    Packs/day: 2.00    Years: 38.00    Pack years: 76.00    Types: Cigarettes    Start date: 10/08/1961    Quit date: 07/07/2000    Years since quitting: 21.0   Smokeless tobacco: Never  Vaping Use   Vaping Use: Never used  Substance and Sexual Activity   Alcohol use: Not on file   Drug use: Never   Sexual activity: Not Currently    Birth control/protection: Abstinence  Other Topics Concern   Not on file  Social History Narrative   Not on  file   Social Determinants of Health   Financial Resource Strain: Low Risk    Difficulty of Paying Living Expenses: Not hard at all  Food Insecurity: No Food Insecurity   Worried About Programme researcher, broadcasting/film/video in the Last Year: Never true   Ran Out of Food in the Last Year: Never true  Transportation Needs: No Transportation Needs   Lack of Transportation (Medical): No   Lack of Transportation (Non-Medical): No  Physical Activity: Sufficiently Active   Days of Exercise per Week: 3 days   Minutes of Exercise per Session: 60 min  Stress: Stress Concern Present   Feeling of Stress : To some extent  Social Connections: Moderately Isolated   Frequency of Communication with Friends and Family: Three times a week   Frequency of Social Gatherings with Friends and Family: More than three times a week   Attends Religious Services: More than 4 times per year   Active Member of Clubs or Organizations: No   Attends Banker Meetings: Never   Marital Status: Widowed    Tobacco Counseling Counseling given: Not Answered   Clinical Intake:  Pre-visit preparation completed: Yes  Pain : 0-10 Pain Score: 5  Pain Type: Chronic pain Pain Location: Hip Pain Orientation: Right Pain Descriptors / Indicators: Aching Pain Onset: More than a month ago Pain Frequency: Intermittent     Nutritional Risks: None Diabetes: No  How often do you need to have someone help you when you read instructions, pamphlets, or other written materials from your doctor or pharmacy?: 1 - Never  Diabetic?No  Interpreter Needed?: No  Information entered by :: Lanier Ensign, LPN   Activities of Daily Living In your present state of health, do you have any difficulty performing the following activities: 07/05/2021 03/08/2021  Hearing? Y N  Comment slight loss -  Vision? N N  Difficulty concentrating or making decisions? N N  Walking or climbing stairs? Y N  Comment a little hitch in my gid dee up -   Dressing or bathing? N N  Doing errands, shopping? N N  Preparing Food and eating ? N -  Using the Toilet? N -  In the past six months, have you accidently leaked urine? N -  Do you have problems with loss of bowel control? N -  Managing your Medications? N -  Managing your Finances? N -  Housekeeping or managing your Housekeeping? N -    Patient Care Team: Christen Butter, NP as PCP - General (Nurse Practitioner)  Indicate any recent Medical Services you may have received from other than [facility] providers in the past year (date may be approximate).     Assessment:   This is a routine wellness examination for Christine Barry.  Hearing/Vision screen Hearing Screening - Comments:: Pt states  a slight hearing loss Vision Screening - Comments:: Pt will follow up with a provider for annual eye exams in this area   Dietary issues and exercise activities discussed: Current Exercise Habits: Home exercise routine, Type of exercise: Other - see comments, Time (Minutes): 30, Frequency (Times/Week): 3, Weekly Exercise (Minutes/Week): 90   Goals Addressed             This Visit's Progress    Patient Stated       Live and be mobile until the day I die       Depression Screen PHQ 2/9 Scores 07/05/2021 03/08/2021  PHQ - 2 Score 0 0    Fall Risk Fall Risk  07/05/2021 03/08/2021  Falls in the past year? 0 0  Number falls in past yr: 0 0  Injury with Fall? 0 0  Risk for fall due to : Impaired vision -  Follow up Falls prevention discussed Falls evaluation completed    FALL RISK PREVENTION PERTAINING TO THE HOME:  Any stairs in or around the home? No  If so, are there any without handrails? No  Home free of loose throw rugs in walkways, pet beds, electrical cords, etc? Yes  Adequate lighting in your home to reduce risk of falls? Yes   ASSISTIVE DEVICES UTILIZED TO PREVENT FALLS:  Life alert? No  Use of a cane, walker or w/c? No  Grab bars in the bathroom? Yes  Shower chair or bench in shower?  Yes  Elevated toilet seat or a handicapped toilet? No   TIMED UP AND GO:  Was the test performed? No .   Cognitive Function:     6CIT Screen 07/05/2021  What Year? 0 points  What month? 0 points  What time? 0 points  Count back from 20 0 points  Months in reverse 0 points  Repeat phrase 0 points  Total Score 0    Immunizations Immunization History  Administered Date(s) Administered   Influenza Split 08/02/2010, 08/09/2016, 07/15/2017, 07/18/2018   Influenza, High Dose Seasonal PF 07/07/2015, 08/10/2016, 07/23/2019   Influenza, Quadrivalent, Recombinant, Inj, Pf 07/18/2018   Influenza-Unspecified 06/26/2011, 07/21/2020   PFIZER(Purple Top)SARS-COV-2 Vaccination 11/02/2019, 11/23/2019, 07/05/2020, 07/01/2021   Pneumococcal Conjugate-13 10/08/2005, 06/21/2014, 07/18/2018, 07/22/2018   Pneumococcal Polysaccharide-23 06/04/2019   Pneumococcal-Unspecified 10/08/2005   Tdap 03/01/2018   Zoster Recombinat (Shingrix) 12/16/2017, 05/05/2018   Zoster, Live 08/02/2011    TDAP status: Up to date  Flu Vaccine status: Due, Education has been provided regarding the importance of this vaccine. Advised may receive this vaccine at local pharmacy or Health Dept. Aware to provide a copy of the vaccination record if obtained from local pharmacy or Health Dept. Verbalized acceptance and understanding.  Pneumococcal vaccine status: Up to date  Covid-19 vaccine status: Completed vaccines  Qualifies for Shingles Vaccine? Yes   Zostavax completed Yes   Shingrix Completed?: Yes  Screening Tests Health Maintenance  Topic Date Due   INFLUENZA VACCINE  01/05/2022 (Originally 05/08/2021)   Hepatitis C Screening  03/08/2022 (Originally 01/05/1959)   TETANUS/TDAP  03/01/2028   DEXA SCAN  Completed   COVID-19 Vaccine  Completed   Zoster Vaccines- Shingrix  Completed   HPV VACCINES  Aged Out    Health Maintenance  There are no preventive care reminders to display for this  patient.   Colonoscopy pt needs to address if needed related to history of polyps  Mammograms: order placed 07/05/21    Bone Density status: Completed 12/09/18. Results reflect: Bone density results: NORMAL.  Repeat every 2 years.   Additional Screening:  Hepatitis C Screening: does qualify;  Vision Screening: Recommended annual ophthalmology exams for early detection of glaucoma and other disorders of the eye. Is the patient up to date with their annual eye exam?  Yes  Who is the provider or what is the name of the office in which the patient attends annual eye exams? Pt follow up with a new provider  If pt is not established with a provider, would they like to be referred to a provider to establish care? No .   Dental Screening: Recommended annual dental exams for proper oral hygiene  Community Resource Referral / Chronic Care Management: CRR required this visit?  No   CCM required this visit?  No      Plan:     I have personally reviewed and noted the following in the patient's chart:   Medical and social history Use of alcohol, tobacco or illicit drugs  Current medications and supplements including opioid prescriptions. Patient is not currently taking opioid prescriptions. Functional ability and status Nutritional status Physical activity Advanced directives List of other physicians Hospitalizations, surgeries, and ER visits in previous 12 months Vitals Screenings to include cognitive, depression, and falls Referrals and appointments  In addition, I have reviewed and discussed with patient certain preventive protocols, quality metrics, and best practice recommendations. A written personalized care plan for preventive services as well as general preventive health recommendations were provided to patient.     Marzella Schlein, LPN   01/07/271   Nurse Notes: pt stated that she is in need of colonoscopy related to history of polyps. Can this be ordered for her if  needed.

## 2021-07-06 ENCOUNTER — Encounter: Payer: Self-pay | Admitting: Pulmonary Disease

## 2021-07-06 ENCOUNTER — Ambulatory Visit: Payer: PPO | Admitting: Pulmonary Disease

## 2021-07-06 VITALS — BP 130/70 | HR 68 | Temp 97.8°F | Ht 66.0 in | Wt 160.8 lb

## 2021-07-06 DIAGNOSIS — J9601 Acute respiratory failure with hypoxia: Secondary | ICD-10-CM | POA: Diagnosis not present

## 2021-07-06 DIAGNOSIS — J449 Chronic obstructive pulmonary disease, unspecified: Secondary | ICD-10-CM | POA: Diagnosis not present

## 2021-07-06 DIAGNOSIS — J3089 Other allergic rhinitis: Secondary | ICD-10-CM | POA: Diagnosis not present

## 2021-07-06 NOTE — Patient Instructions (Addendum)
Severe COPD/asthma overlaop --CONTINUE Trelegy ONE puff ONCE a day --CONTINUE Albuterol AS NEEDED for shortness of breath or wheezing --CONTINUE Duonebs AS NEEDED  for shortness of breath or wheezing --Use flutter valve AS NEEDED for mucous clearance  Acute hypoxemic respiratory failure secondary to prior to COPD exacerbation --Ambulatory O2 with no desaturations --No oxygen needed  Allergic Rhinitis --CONTINUE Flonase 1 spray twice a day as needed  Follow-up with me in 3 months

## 2021-07-06 NOTE — Addendum Note (Signed)
Addended by: Hyman Hopes B on: 07/06/2021 12:47 PM   Modules accepted: Orders

## 2021-07-06 NOTE — Progress Notes (Signed)
Subjective:   PATIENT ID: Christine Barry GENDER: female DOB: 1941/08/13, MRN: [id number]   HPI  Chief Complaint  Patient presents with   Follow-up    COPD   Reason for Visit: follow-up  [name] is a 80 year old female former smoker with COPD who presents for follow-up.  Synopsis: She was diagnosed with COPD in 2016 and followed by a Pulmonogist, Dr. Wilma Flavin in [place]. She moved to be near her daughter in [place], [place]. At baseline, she is active and enjoys going to the farmer's market weekly. Denies coughing shortness of breath and wheezing. She is compliant with her bronchodilators with Dulera and rarely used her albuterol. In 2021 she had a prolonged exacerbation requiring 2-3 months of steroids.  04/25/21 She established care at [facility] in 04/2021 and started treatment for COPD exacerbation. COVID-19 neg x 3 prior to her appointment. On one week follow-up today she reports one to two good days but has persistent shortness of breath, wheezing cough. During the prednisone taper her symptom seem to have recurred. Symptoms are worse in the mornings. After nebulizer treatments she will have a productive cough. Her oxygen levels have dropped to 89% at home with activity. Does not have supplemental O2. She has been compliant with Duonebs QID and symbicort twice daily.   07/06/21 In July she was treated for COPD exacerbation and then started on Trelegy 100 on follow-up visit with Dr. Maple Hudson. She rarely uses albuterol except before exercise. She recently broke her hip has limited her activity. She is doing yoga, light cardio and resistance exercises at [employer]. No longer wearing oxygen as she feels she is doing better.   Social History: Former smoker. Quit in 2001. 76 pack-years  Past Medical History:  Diagnosis Date   Asthma    COPD (chronic obstructive pulmonary disease) (HCC)    Hyperlipidemia    Thyroid disease     Allergies  Allergen Reactions    Penicillins Other (See Comments)    Not sure (occurred in the 1970's), but thinks she "passed out"   Sulfa Antibiotics      Outpatient Medications Prior to Visit  Medication Sig Dispense Refill   albuterol (VENTOLIN HFA) 108 (90 Base) MCG/ACT inhaler Inhale 180 each into the lungs 4 (four) times daily as needed.     ALPRAZolam (XANAX) 0.25 MG tablet Take 0.25 mg by mouth at bedtime as needed for anxiety.     cetirizine (ZYRTEC) 10 MG tablet Take 10 mg by mouth daily as needed.     Cholecalciferol 50 MCG (2000 UT) TABS Take 2,000 Units by mouth daily.     cyanocobalamin 1000 MCG tablet Take 1,000 mcg by mouth 3 (three) times a week.     EUTHYROX 200 MCG tablet Take one tablet by mouth once daily in the morning before breakfast except for 2nd and 4th Sundays of the month. 180 tablet 0   Ferrous Sulfate (IRON PO) Take by mouth. Nature made     Fluticasone-Umeclidin-Vilant (TRELEGY ELLIPTA) 100-62.5-25 MCG/INH AEPB Inhale 1 puff into the lungs daily. 1 each 6   Ibuprofen 200 MG CAPS Take 200 mg by mouth daily as needed.     simvastatin (ZOCOR) 20 MG tablet Take 1 tablet (20 mg total) by mouth at bedtime. 90 tablet 1   No facility-administered medications prior to visit.    Review of Systems  Constitutional:  Negative for chills, diaphoresis, fever, malaise/fatigue and weight loss.  HENT:  Negative for congestion.  Respiratory:  Positive for shortness of breath. Negative for cough, hemoptysis, sputum production and wheezing.   Cardiovascular:  Negative for chest pain, palpitations and leg swelling.    Objective:   Vitals:   07/06/21 1453  BP: 130/70  Pulse: 68  Temp: 97.8 F (36.6 C)  TempSrc: Oral  SpO2: 98%  Weight: 160 lb 12.8 oz (72.9 kg)  Height: 5\' 6"  (1.676 m)      Physical Exam: General: Well-appearing, no acute distress HENT: [place], AT Eyes: EOMI, no scleral icterus Respiratory: Clear to auscultation bilaterally.  No crackles, wheezing or rales Cardiovascular:  RRR, -M/R/G, no JVD Extremities:-Edema,-tenderness Neuro: AAO x4, CNII-XII grossly intact Psych: Normal mood, normal affect  Data Reviewed:  Imaging: CXR 04/18/21 - No acute infiltrate, effusion or edema  PFT: OSH 2019 FEV1 0.8. TGV 174% DLCO 49%. Positive bronchodilator response. Interpretation: Severe obstructive defect with hyperinflation and reduced gas exchange consistent with emphysema.  03/18/2020 spirometry: FEV1 0.54, 24%, FVC 1.3, ratio 0.41.   12/26/2020: FEV1 0.6, 25%, FVC 1.3, ratio 0.44.  Interpretation: Very severe obstructive defect   Labs: OSH 2016 alpha-1 MS phenotype with a level of 135  OSH 01/06/20 Absolute eos 200  Ambulatory O2 07/06/21 No desaturations on ambulation  Assessment & Plan:   Discussion: 80 year old female with very severe COPD (FEV1 25%) who presents for follow-up. Last COPD exacerbation 04/2021 which required prolonged steroid taper. Currently on triple therapy.   Ambulatory O2 demonstrated no desaturations.  Severe COPD/asthma overlaop --CONTINUE Trelegy ONE puff ONCE a day --CONTINUE Albuterol AS NEEDED for shortness of breath or wheezing --CONTINUE Duonebs AS NEEDED  for shortness of breath or wheezing --Use flutter valve AS NEEDED for mucous clearance  Acute hypoxemic respiratory failure secondary to prior to COPD exacerbation --Ambulatory O2 with no desaturations --No oxygen needed  Allergic Rhinitis --CONTINUE Flonase 1 spray twice a day as needed  Health Maintenance Immunization History  Administered Date(s) Administered   Influenza Split 08/02/2010, 08/09/2016, 07/15/2017, 07/18/2018   Influenza, High Dose Seasonal PF 07/07/2015, 08/10/2016, 07/23/2019   Influenza, Quadrivalent, Recombinant, Inj, Pf 07/18/2018   Influenza-Unspecified 06/26/2011, 07/21/2020   PFIZER(Purple Top)SARS-COV-2 Vaccination 11/02/2019, 11/23/2019, 07/05/2020, 07/01/2021   Pneumococcal Conjugate-13 10/08/2005, 06/21/2014, 07/18/2018, 07/22/2018    Pneumococcal Polysaccharide-23 06/04/2019   Pneumococcal-Unspecified 10/08/2005   Tdap 03/01/2018   Zoster Recombinat (Shingrix) 12/16/2017, 05/05/2018   Zoster, Live 08/02/2011   CT Lung Screen - discuss at next visit  No orders of the defined types were placed in this encounter.  No orders of the defined types were placed in this encounter.   Return in about 3 months (around 10/05/2021).  I have spent a total time of 33-minutes on the day of the appointment reviewing prior documentation, coordinating care and discussing medical diagnosis and plan with the patient/family. Past medical history, allergies, medications were reviewed. Pertinent imaging, labs and tests included in this note have been reviewed and interpreted independently by me.  Sriram Febles 10/07/2021, MD [facility] 07/06/2021 11:47 AM  Office Number [phone]

## 2021-07-07 ENCOUNTER — Other Ambulatory Visit: Payer: Self-pay | Admitting: Pulmonary Disease

## 2021-07-07 DIAGNOSIS — J4540 Moderate persistent asthma, uncomplicated: Secondary | ICD-10-CM

## 2021-07-07 DIAGNOSIS — J449 Chronic obstructive pulmonary disease, unspecified: Secondary | ICD-10-CM

## 2021-07-10 NOTE — Telephone Encounter (Signed)
PCC's   please advise if this order can be faxed to the listed number below.  Thanks  order is already in the referral for DME: [facility].  Thanks    Please fax a discontinuation order to the following fax number.   Fax# [phone] or [phone]   I received a letter from them today re: same. Thank you.

## 2021-07-12 ENCOUNTER — Encounter: Payer: Self-pay | Admitting: Medical-Surgical

## 2021-07-13 ENCOUNTER — Other Ambulatory Visit: Payer: Self-pay

## 2021-07-13 ENCOUNTER — Ambulatory Visit (INDEPENDENT_AMBULATORY_CARE_PROVIDER_SITE_OTHER): Payer: PPO

## 2021-07-13 DIAGNOSIS — Z1231 Encounter for screening mammogram for malignant neoplasm of breast: Secondary | ICD-10-CM | POA: Diagnosis not present

## 2021-07-13 NOTE — Progress Notes (Signed)
Pt stated that her last colonoscopy was 06/03/18 by Dr Burton Apley , please note in her records.   Thank You Lanier Ensign, LPN/NHA

## 2021-08-15 ENCOUNTER — Telehealth: Payer: Self-pay | Admitting: Pulmonary Disease

## 2021-08-15 NOTE — Telephone Encounter (Signed)
Fax received from Dr. Buel Ream to perform a extraction of tooth #29 with direct placement of implant on patient.  Patient needs surgery clearance. Patient was seen on 07/06/21. Office protocol is a risk assessment can be sent to surgeon if patient has been seen in 60 days or less.   Sending to Dr. Everardo All for risk assessment or recommendations if patient needs to be seen in office prior to surgical procedure.  If she is cleared please addend ov note or send message back to procedure pool.

## 2021-08-15 NOTE — Telephone Encounter (Signed)
I have reviewed patient's chart and recent note on 07/06/21. She is low risk on 2.73% for peri-op pulmonary complications Chales Abrahams).  Peri-operative Assessment of Pulmonary Risk for Non-Thoracic Surgery:  For Christine Barry, risk of perioperative pulmonary complications is increased by: Age greater than 65 years, COPD.    Respiratory complications generally occur in 1% of ASA Class I patients, 5% of ASA Class II and 10% of ASA Class III-IV patients These complications rarely result in mortality and include postoperative pneumonia, atelectasis, pulmonary embolism, ARDS and increased time requiring postoperative mechanical ventilation.  Overall, I recommend proceeding with the surgery if the risk for respiratory complications are outweighed by the potential benefits. This will need to be discussed between the patient and surgeon.  To reduce risks of respiratory complications, I recommend: --Pre- and post-operative incentive spirometry performed frequently while awake --Avoiding use of pancuronium during anesthesia.

## 2021-08-15 NOTE — Telephone Encounter (Signed)
I have printed this note and will fax everything back

## 2021-08-16 NOTE — Telephone Encounter (Signed)
Dr. Everardo All has done the risk assessment on patient. I have printed that off and faxed back form and clearance. Nothing further needed at this time.

## 2021-10-21 DIAGNOSIS — J441 Chronic obstructive pulmonary disease with (acute) exacerbation: Secondary | ICD-10-CM | POA: Diagnosis not present

## 2021-11-10 ENCOUNTER — Telehealth: Payer: Self-pay | Admitting: Medical-Surgical

## 2021-11-10 DIAGNOSIS — E890 Postprocedural hypothyroidism: Secondary | ICD-10-CM

## 2021-11-10 DIAGNOSIS — L659 Nonscarring hair loss, unspecified: Secondary | ICD-10-CM

## 2021-11-10 NOTE — Telephone Encounter (Signed)
Pt called and said she is experiencing severe hair loss. She wants to know if she can get a referral to [facility]. She has a visit scheduled for 3/1 with Joy. I was not able to find her an earlier appt then that unless we use a same day/acute visit.

## 2021-11-13 NOTE — Telephone Encounter (Signed)
Patient advised.

## 2021-11-13 NOTE — Telephone Encounter (Signed)
There are multiple reasons for hair loss outside of thyroid issues. We can complete a preliminary workup here but if she would prefer endocrinology weighs in, that is fine. A referral has been entered. Please advise patient that the endocrinology folks with reach out to her. Depending on findings, she may be more appropriately referred to dermatology but unable to determine this without seeing her.   ___________________________________________ Thayer Ohm, DNP, APRN, FNP-BC Primary Care and Sports Medicine [facility]

## 2021-11-21 DIAGNOSIS — J441 Chronic obstructive pulmonary disease with (acute) exacerbation: Secondary | ICD-10-CM | POA: Diagnosis not present

## 2021-12-04 ENCOUNTER — Encounter: Payer: Self-pay | Admitting: Medical-Surgical

## 2021-12-06 ENCOUNTER — Other Ambulatory Visit: Payer: Self-pay

## 2021-12-06 ENCOUNTER — Encounter: Payer: Self-pay | Admitting: Medical-Surgical

## 2021-12-06 ENCOUNTER — Ambulatory Visit (INDEPENDENT_AMBULATORY_CARE_PROVIDER_SITE_OTHER): Payer: PPO | Admitting: Medical-Surgical

## 2021-12-06 VITALS — BP 124/72 | HR 75 | Resp 20 | Ht 66.0 in | Wt 162.4 lb

## 2021-12-06 DIAGNOSIS — L659 Nonscarring hair loss, unspecified: Secondary | ICD-10-CM

## 2021-12-06 DIAGNOSIS — L84 Corns and callosities: Secondary | ICD-10-CM

## 2021-12-06 DIAGNOSIS — R21 Rash and other nonspecific skin eruption: Secondary | ICD-10-CM

## 2021-12-06 DIAGNOSIS — E890 Postprocedural hypothyroidism: Secondary | ICD-10-CM | POA: Diagnosis not present

## 2021-12-06 DIAGNOSIS — M25551 Pain in right hip: Secondary | ICD-10-CM | POA: Diagnosis not present

## 2021-12-06 MED ORDER — FLUTICASONE-UMECLIDIN-VILANT 100-62.5-25 MCG/ACT IN AEPB: 1.0000 | INHALATION_SPRAY | Freq: Every day | RESPIRATORY_TRACT | 6 refills | Status: DC

## 2021-12-06 MED ORDER — MELOXICAM 15 MG PO TABS: 15.0000 mg | ORAL_TABLET | Freq: Every day | ORAL | 0 refills | Status: DC

## 2021-12-06 MED ORDER — SIMVASTATIN 20 MG PO TABS: 20.0000 mg | ORAL_TABLET | Freq: Every day | ORAL | 1 refills | Status: DC

## 2021-12-06 NOTE — Progress Notes (Signed)
?HPI with pertinent ROS:  ? ?CC: Chronic disease follow-up ? ?HPI: ?Very pleasant 81 year old female presenting today for general follow-up.  She has the following complaints: ? ?Right hip pain-in 2021, she had an episode of similar right hip pain that affects the lateral right hip from the edge of the buttock down the thigh into the lateral knee and calf.  She did 12 sessions of physical therapy which was very helpful and continue to do her exercises at home.  Unfortunately she has fallen off the wagon on doing her home exercises and the pain has returned.  She is not currently doing physical therapy but is interested in restarting.  She is using ibuprofen twice a day but this does not seem to be helping very much. ? ?Right toe-has a lump that is come up on her right toe that is somewhat uncomfortable and would like to have it evaluated. ? ?Hair loss-has had difficulty with hair loss before.  She does have known thyroid issues and is currently taking Euthyrox as prescribed.  She did have her haircut recently and now notes that she has far more bald spots than she thought she did.  She has also had periods of feeling "low" which is not her typical personality.  She notes dry skin that is chronic for her and she uses Aquaphor to treat this.  Interested in getting her thyroid level checked. ? ?Face bumps-has small areas on the left of the nose and left cheek that are flesh toned, slightly raised.  She was seeing dermatology a few years ago and getting yearly checks however her relocation left her without a dermatologist.  She would like to have a referral to dermatology today. ? ?Mood-she has an adult son who has mental health concerns living with her.  This does affect her mood but she is working on developing self protection measures so that his mood issues do not become hers. ? ? ?I reviewed the past medical history, family history, social history, surgical history, and allergies today and no changes were needed.   Please see the problem list section below in epic for further details. ? ? ?Physical exam:  ? ?General: Well Developed, well nourished, and in no acute distress.  ?Neuro: Alert and oriented x3.  ?HEENT: Normocephalic, atraumatic.  ?Skin: Warm and dry. 49mm round yellow colored, raised, rough lesion to the medial aspect of the distal right second toe. Diffuse hair thinning.  ?Cardiac: Regular rate and rhythm, no murmurs rubs or gallops, no lower extremity edema.  ?Respiratory: Clear to auscultation bilaterally. Not using accessory muscles, speaking in full sentences. ? ?Impression and Recommendations:   ? ?1. Postoperative hypothyroidism ?Checking TSH. Continue Euthyrox , titrate per lab results.  ?- TSH ? ?2. Hair loss ?Checking TSH. Consider female pattern hair loss, nutritional deficiency, thyroid imbalance, or medication side effect.  ? ?3. Right hip pain ?Symptoms consistent with IT band syndrome. Printed information regarding IT band issues and recommended home exercises provided with AVS. Referring to PT.  ?- Ambulatory referral to Physical Therapy ? ?4. Corn of toe ?Referring to podiatry for general foot care and follow up. Cryotherapy performed to corn with 3 passes completed. Patient tolerated well. Reviewed expectations of healing and recommended monitoring.  ?- Ambulatory referral to Podiatry ? ?5. Facial rash ?Referring to dermatology per patient request.  ?- Ambulatory referral to Dermatology ? ?Return in about 6 months (around 06/08/2022) for chronic disease follow up. ?___________________________________________ ?Thayer Ohm, DNP, APRN, FNP-BC ?Primary Care and  Sports Medicine ?[facility] ?

## 2021-12-07 ENCOUNTER — Other Ambulatory Visit: Payer: Self-pay | Admitting: Medical-Surgical

## 2021-12-07 DIAGNOSIS — E890 Postprocedural hypothyroidism: Secondary | ICD-10-CM

## 2021-12-07 LAB — TSH: TSH: 0.3 mIU/L — ABNORMAL LOW (ref 0.40–4.50)

## 2021-12-07 MED ORDER — EUTHYROX 200 MCG PO TABS: ORAL_TABLET | ORAL | 0 refills | Status: DC

## 2021-12-13 DIAGNOSIS — L821 Other seborrheic keratosis: Secondary | ICD-10-CM | POA: Diagnosis not present

## 2021-12-13 DIAGNOSIS — L720 Epidermal cyst: Secondary | ICD-10-CM | POA: Diagnosis not present

## 2021-12-13 DIAGNOSIS — L649 Androgenic alopecia, unspecified: Secondary | ICD-10-CM | POA: Diagnosis not present

## 2021-12-14 ENCOUNTER — Ambulatory Visit: Payer: PPO | Admitting: Podiatry

## 2021-12-14 ENCOUNTER — Other Ambulatory Visit: Payer: Self-pay

## 2021-12-14 ENCOUNTER — Encounter: Payer: Self-pay | Admitting: Podiatry

## 2021-12-14 DIAGNOSIS — L84 Corns and callosities: Secondary | ICD-10-CM

## 2021-12-14 DIAGNOSIS — M21611 Bunion of right foot: Secondary | ICD-10-CM | POA: Diagnosis not present

## 2021-12-14 DIAGNOSIS — M2041 Other hammer toe(s) (acquired), right foot: Secondary | ICD-10-CM | POA: Diagnosis not present

## 2021-12-14 NOTE — Progress Notes (Signed)
?  Subjective:  ?Patient ID: Christine Barry, female    DOB: [birth year],   MRN: [id number] ? ?Chief Complaint  ?Patient presents with  ? Callouses  ?  Second toe corn , patient states corn is not painful , she would also like routine foot care states she can no longer reach her toenails  ? ? ?81 y.o. female presents for concern of right second toe corn that has been present for months. Relates as a child she had flat feet and has noticed recently some changes in her toes and this area popped up. States it is painful with pressure.  Denies any other pedal complaints. Denies n/v/f/c.  ? ?Past Medical History:  ?Diagnosis Date  ? Asthma   ? COPD (chronic obstructive pulmonary disease) (HCC)   ? Hyperlipidemia   ? Thyroid disease   ? ? ?Objective:  ?Physical Exam: ?Vascular: DP/PT pulses 2/4 bilateral. CFT <3 seconds. Normal hair growth on digits. No edema.  ?Skin. No lacerations or abrasions bilateral feet.  ?Musculoskeletal: MMT 5/5 bilateral lower extremities in DF, PF, Inversion and Eversion. Deceased ROM in DF of ankle joint. Right second digit hammertoe and right mild bunion deformity.  ?Neurological: Sensation intact to light touch.  ? ?Assessment:  ? ?1. Corns and callosities   ?2. Hammertoe of right foot   ?3. Bunion, right foot   ? ? ? ?Plan:  ?Patient was evaluated and treated and all questions answered. ?-Discussed corns and calluses hammertoes and bunions with patient and treatment options. Discussed etiology due to toe deformities and possible options short of surgery.  ?-Hyperkeratotic tissue was debrided with chisel without incident.  ?- Nails 1-5 b/l trimmed as courtesy for patient.  ?-Encouraged daily moisturizing ?-Discussed use of pumice stone ?-Discussed padding and toe caps.  ?-Advised good supportive shoes and inserts. Gave recommendations.  ?-Patient to return to office as needed or sooner if condition worsens. ? ? ?Louann Sjogren, DPM  ? ? ?

## 2021-12-19 DIAGNOSIS — J441 Chronic obstructive pulmonary disease with (acute) exacerbation: Secondary | ICD-10-CM | POA: Diagnosis not present

## 2021-12-20 ENCOUNTER — Ambulatory Visit: Payer: PPO | Attending: Medical-Surgical | Admitting: Physical Therapy

## 2021-12-20 ENCOUNTER — Other Ambulatory Visit: Payer: Self-pay

## 2021-12-20 ENCOUNTER — Encounter: Payer: Self-pay | Admitting: Physical Therapy

## 2021-12-20 DIAGNOSIS — M6281 Muscle weakness (generalized): Secondary | ICD-10-CM | POA: Diagnosis not present

## 2021-12-20 DIAGNOSIS — M25551 Pain in right hip: Secondary | ICD-10-CM | POA: Insufficient documentation

## 2021-12-20 DIAGNOSIS — R29898 Other symptoms and signs involving the musculoskeletal system: Secondary | ICD-10-CM | POA: Diagnosis not present

## 2021-12-20 NOTE — Patient Instructions (Signed)
Access Code: [email or web address] ?URL: [email or web address] ?Date: 12/20/2021 ?Prepared by: [facility] ? ?Exercises ?Seated Hamstring Stretch - 1 x daily - 7 x weekly - 1 sets - 3 reps - 20-30 seconds hold ?Seated Hip Flexor Stretch - 1 x daily - 7 x weekly - 1 sets - 3 reps - 20-30 sec hold ?Hip Abduction with Resistance Loop - 1 x daily - 7 x weekly - 2 sets - 10 reps ?Hip Extension with Resistance Loop - 1 x daily - 7 x weekly - 2 sets - 10 reps ?Roller Massage Elongated IT Band Release - 1 x daily - 7 x weekly - 1 sets - 3 reps - 20-30 sec hold ? ?

## 2021-12-20 NOTE — Therapy (Signed)
[place] [facility] ?[address] [place] [address] ?[place], [place], [address] ?Phone: [phone]   Fax:  [phone] ? ?Physical Therapy Evaluation ? ?Patient Details  ?Name: Christine Barry ?MRN: [id number] ?Date of Birth: 1940/12/13 ?Referring Provider (PT): Christen Butter ? ? ?Encounter Date: 12/20/2021 ? ? PT End of Session - 12/20/21 1146   ? ? Visit Number 1   ? Number of Visits 12   ? Date for PT Re-Evaluation 01/31/22   ? Authorization Type healthteam advantage   ? Authorization - Visit Number 1   ? PT Start Time 1110   ? PT Stop Time 1145   ? PT Time Calculation (min) 35 min   ? Activity Tolerance Patient tolerated treatment well   ? Behavior During Therapy [facility] for tasks assessed/performed   ? ?  ?  ? ?  ? ? ?Past Medical History:  ?Diagnosis Date  ? Asthma   ? COPD (chronic obstructive pulmonary disease) (HCC)   ? Hyperlipidemia   ? Thyroid disease   ? ? ?Past Surgical History:  ?Procedure Laterality Date  ? CESAREAN SECTION    ? ? ?There were no vitals filed for this visit. ? ? ? Subjective Assessment - 12/20/21 1109   ? ? Subjective Pt states she has pain on the Rt side of her hip which travels down to her ankle. Pt has had this issue before, she states it "comes and goes". New onset has been "about 2 months".  Pain increases with laying on Rt side, otherwise insiduous onset. Pain decreases with meds and yoga (silver sneakers).   ? Pertinent History COPD, hammer toe Rt foot   ? Patient Stated Goals decrease pain   ? Currently in Pain? Yes   ? Pain Score 2    ? Pain Location Hip   ? Pain Orientation Right   ? Pain Descriptors / Indicators Sore   ? Pain Type Chronic pain   ? Pain Onset More than a month ago   ? Pain Frequency Intermittent   ? Aggravating Factors  laying on Rt side   ? Pain Relieving Factors stretch, meds   ? ?  ?  ? ?  ? ? ? ? ? [facility] PT Assessment - 12/20/21 0001   ? ?  ? Assessment  ? Medical Diagnosis Rt hip pain   ? Referring Provider (PT) Christen Butter    ? Onset Date/Surgical Date 10/18/21   ? Next MD Visit PRN   ?  ? Precautions  ? Precautions None   ?  ? Restrictions  ? Weight Bearing Restrictions No   ?  ? Balance Screen  ? Has the patient fallen in the past 6 months No   ?  ? Prior Function  ? Level of Independence Independent   ? Vocation Retired   ?  ? Observation/Other Assessments  ? Focus on Therapeutic Outcomes (FOTO)  63   ?  ? ROM / Strength  ? AROM / PROM / Strength Strength;AROM   ?  ? AROM  ? Overall AROM Comments Rt hip IR/ER and flexion WFL   ?  ? Strength  ? Strength Assessment Site Hip   ? Right/Left Hip Right;Left   ? Right Hip Flexion 3+/5   ? Right Hip Extension 3/5   ? Right Hip External Rotation  3/5   ? Right Hip ABduction 3/5   ? Left Hip Flexion 4-/5   ? Left Hip Extension 3+/5   ?  ? Flexibility  ?  Soft Tissue Assessment /Muscle Length yes   ? Hamstrings decreased bilat   ? Quadriceps decreased Rt > Lt   ? ITB decreased Rt > Lt   ?  ? Palpation  ? Palpation comment TTP Rt ITB   ?  ? Special Tests  ? Other special tests Ober (-) bilat   ? ?  ?  ? ?  ? ? ? ? ? ? ? ? ? ? ? ? ? ?Objective measurements completed on examination: See above findings.  ? ? ? ? ? [facility] Adult PT Treatment/Exercise - 12/20/21 0001   ? ?  ? Self-Care  ? Self-Care Other Self-Care Comments   ? Other Self-Care Comments  self ITB massage with rolling pin   ?  ? Exercises  ? Exercises Knee/Hip   ?  ? Knee/Hip Exercises: Stretches  ? Passive Hamstring Stretch 2 reps;20 seconds   ? Passive Hamstring Stretch Limitations seated   ? Hip Flexor Stretch 2 reps;20 seconds   ? Hip Flexor Stretch Limitations seated   ?  ? Knee/Hip Exercises: Standing  ? Hip Abduction 10 reps   ? Abduction Limitations red TB   ? Hip Extension 10 reps   ? Extension Limitations red TB   ? ?  ?  ? ?  ? ? ? ? ? ? ? ? ? ? PT Education - 12/20/21 1138   ? ? Education Details PT POC and goals, HEP   ? Person(s) Educated Patient   ? Methods Explanation;Demonstration;Handout   ? Comprehension Returned  demonstration;Verbalized understanding   ? ?  ?  ? ?  ? ? ? ? ? ? PT Long Term Goals - 12/20/21 1149   ? ?  ? PT LONG TERM GOAL #1  ? Title Pt will be independent in HEP   ? Time 6   ? Period Weeks   ? Status New   ? Target Date 01/31/22   ?  ? PT LONG TERM GOAL #2  ? Title Pt will improve FOTO to >=69 to demo improved functional mobility   ? Time 6   ? Period Weeks   ? Status New   ? Target Date 01/31/22   ?  ? PT LONG TERM GOAL #3  ? Title Pt will improve bilat hip strength to 4/5 to improve activity tolerance   ? Time 6   ? Period Weeks   ? Status New   ? Target Date 01/31/22   ?  ? PT LONG TERM GOAL #4  ? Title Pt will tolerate sleeping on Rt side with pain <= 1/10   ? Time 6   ? Period Weeks   ? Status New   ? Target Date 01/31/22   ? ?  ?  ? ?  ? ? ? ? ? ? ? ? ? Plan - 12/20/21 1147   ? ? Clinical Impression Statement Pt is a 81 y/o female referred for Rt hip pain. Pt presents with decreased hamstring and ITB length, TTP Rt ITB, decreased hip strength. She will benefit from skilled PT to address deficits and improve functional strength and mobility.   ? Personal Factors and Comorbidities Comorbidity 2;Time since onset of injury/illness/exacerbation;Fitness   ? Examination-Activity Limitations Sleep   ? Examination-Participation Restrictions Psychologist, forensicCommunity Activity;Cleaning;Pincus [employer]adder[employer]ard Work   ? Stability/Clinical Decision Making Stable/Uncomplicated   ? Clinical Decision Making Low   ? Rehab Potential Good   ? PT Frequency 2x / week   ? PT Duration 6 weeks   ?  PT Treatment/Interventions Aquatic Therapy;Cryotherapy;Moist Heat;Iontophoresis 4mg /ml Dexamethasone;Electrical Stimulation;Balance training;Neuromuscular re-education;Therapeutic exercise;Therapeutic activities;Patient/family education;Manual techniques;Passive range of motion;Dry needling;Taping   ? PT Next Visit Plan assess HEP, progress hip strength and flexibility   ? PT Home Exercise Plan   ? Consulted and Agree with Plan of Care Patient   ? ?  ?  ? ?   ? ? ?Patient will benefit from skilled therapeutic intervention in order to improve the following deficits and impairments:  Pain, Impaired flexibility, Decreased strength, Decreased activity tolerance, Increased muscle spasms, Difficulty walking ? ?Visit Diagnosis: ?Right hip pain - Plan: PT plan of care cert/re-cert ? ?Muscle weakness (generalized) - Plan: PT plan of care cert/re-cert ? ?Other symptoms and signs involving the musculoskeletal system - Plan: PT plan of care cert/re-cert ? ? ? ? ?Problem List ?Patient Active Problem List  ? Diagnosis Date Noted  ? Postoperative hypothyroidism 03/08/2021  ? Centrilobular emphysema (HCC) 03/08/2021  ? Moderate persistent asthma without complication 03/08/2021  ? Osteoporosis screening 03/08/2021  ? Right hip pain 08/23/2020  ? Anxiety 07/23/2019  ? Diverticulosis 07/23/2019  ? Elevated cholesterol 07/23/2019  ? History of nicotine dependence 07/23/2019  ? Hyperplastic colon polyp 07/23/2019  ? Increased glucose level 07/23/2019  ? Osteoarthritis 07/23/2019  ? Redundant colon 07/23/2019  ? Seasonal allergic rhinitis 07/23/2019  ? Sensorineural hearing loss (SNHL) of both ears 07/23/2019  ? Vitamin D deficiency 07/23/2019  ? Xerosis of skin 07/23/2019  ? Chronic obstructive pulmonary disease (HCC) 07/23/2019  ? Pseudoptosis 09/16/2013  ? Dermatochalasis 09/16/2013  ? ? ?Christine Barry, PT ?12/20/2021, 11:52 AM ? ?[place] ?[facility] ?[address] ?[place], [place], [place] ?Phone: [phone]   Fax:  [phone] ? ?Name: Christine Barry ?MRN: [id number] Hummingbird ?Date of Birth: [birth year] ? ? ?

## 2021-12-25 ENCOUNTER — Ambulatory Visit: Payer: PPO | Admitting: Physical Therapy

## 2021-12-25 ENCOUNTER — Encounter: Payer: Self-pay | Admitting: Physical Therapy

## 2021-12-25 ENCOUNTER — Other Ambulatory Visit: Payer: Self-pay

## 2021-12-25 DIAGNOSIS — M6281 Muscle weakness (generalized): Secondary | ICD-10-CM

## 2021-12-25 DIAGNOSIS — M25551 Pain in right hip: Secondary | ICD-10-CM | POA: Diagnosis not present

## 2021-12-25 DIAGNOSIS — R29898 Other symptoms and signs involving the musculoskeletal system: Secondary | ICD-10-CM

## 2021-12-25 NOTE — Therapy (Addendum)
[place] ?[facility] ?[place] ?[place], [place], [address] ?Phone: [phone]   Fax:  [phone] ? ?Physical Therapy Treatment and Discharge ? ?Patient Details  ?Name: Christine Barry ?MRN: [id number] ?Date of Birth: 1941/05/19 ?Referring Provider (PT): Samuel Bouche ? ? ?Encounter Date: 12/25/2021 ? ? PT End of Session - 12/25/21 1105   ? ? Visit Number 2   ? Number of Visits 12   ? Date for PT Re-Evaluation 01/31/22   ? Authorization Type healthteam advantage   ? Authorization - Visit Number 2   ? PT Start Time 1101   ? PT Stop Time 1144   ? PT Time Calculation (min) 43 min   ? Activity Tolerance Patient tolerated treatment well   ? Behavior During Therapy [facility] for tasks assessed/performed   ? ?  ?  ? ?  ? ? ?Past Medical History:  ?Diagnosis Date  ? Asthma   ? COPD (chronic obstructive pulmonary disease) ([place])   ? Hyperlipidemia   ? Thyroid disease   ? ? ?Past Surgical History:  ?Procedure Laterality Date  ? CESAREAN SECTION    ? ? ?There were no vitals filed for this visit. ? ? Subjective Assessment - 12/25/21 1104   ? ? Subjective Patient stating that she stopped taking Mobic because it was giving her chills. She has pain with driving in the right hip. COPD flared up today.   ? Pertinent History COPD, hammer toe Rt foot   ? Patient Stated Goals decrease pain   ? Currently in Pain? Yes   ? Pain Score 4    ? Pain Location Hip   ? Pain Orientation Right   ? Pain Descriptors / Indicators Sore   ? Pain Type Chronic pain   ? ?  ?  ? ?  ? ? ? ? ? ? ? ? ? ? ? ? ? ? ? ? ? ? ? ? [facility] Adult PT Treatment/Exercise - 12/25/21 0001   ? ?  ? Knee/Hip Exercises: Stretches  ? Passive Hamstring Stretch 2 reps;30 seconds;Right   ? Hip Flexor Stretch 2 reps;30 seconds;Right   ? Hip Flexor Stretch Limitations seated   ? ITB Stretch Limitations attempted with strap but too difficult so went to standing   ? Other Knee/Hip Stretches QL/ITB stretch in the doorway leaning left 2x 30  sec   ?  ? Knee/Hip Exercises: Aerobic  ? Nustep L5 x 5 min   ?  ? Knee/Hip Exercises: Standing  ? Hip Abduction Both;20 reps;Knee straight   ? Abduction Limitations no band; painful with band and no control on right   ? Hip Extension Both;20 reps;Knee straight   ? Extension Limitations RTB on second set   ?  ? Manual Therapy  ? Manual Therapy Myofascial release   ? Manual therapy comments in left SDLY   ? Myofascial Release TPR to right QL and glute min/med; strumming to right lateral quads/ITB   ? ?  ?  ? ?  ? ? ? ? ? ? ? ? ? ? PT Education - 12/25/21 1143   ? ? Education Details HEP; DN education   ? Person(s) Educated Patient   ? Methods Explanation;Demonstration;Handout   ? Comprehension Verbalized understanding;Returned demonstration   ? ?  ?  ? ?  ? ? ? ? ? ? PT Long Term Goals - 12/20/21 1149   ? ?  ? PT LONG TERM GOAL #1  ? Title Pt will be  independent in HEP   ? Time 6   ? Period Weeks   ? Status New   ? Target Date 01/31/22   ?  ? PT LONG TERM GOAL #2  ? Title Pt will improve FOTO to >=69 to demo improved functional mobility   ? Time 6   ? Period Weeks   ? Status New   ? Target Date 01/31/22   ?  ? PT LONG TERM GOAL #3  ? Title Pt will improve bilat hip strength to 4/5 to improve activity tolerance   ? Time 6   ? Period Weeks   ? Status New   ? Target Date 01/31/22   ?  ? PT LONG TERM GOAL #4  ? Title Pt will tolerate sleeping on Rt side with pain <= 1/10   ? Time 6   ? Period Weeks   ? Status New   ? Target Date 01/31/22   ? ?  ?  ? ?  ? ? ? ? ? ? ? ? Plan - 12/25/21 1144   ? ? Clinical Impression Statement Patient tolerated TE fair today. She had difficulty with resisted hip ABD in standing so we removed band today. She also was getting increased radicular sx with seated HS stretch with foot on floor so we changed to her foot/leg on the table which isolated the HS. Good response to MFR/TPR to right QL and gluteals as well as ITB strumming. She would benefit from DN next visit.   ? PT Frequency 2x / week    ? PT Duration 6 weeks   ? PT Treatment/Interventions Aquatic Therapy;Cryotherapy;Moist Heat;Iontophoresis 4mg /ml Dexamethasone;Electrical Stimulation;Balance training;Neuromuscular re-education;Therapeutic exercise;Therapeutic activities;Patient/family education;Manual techniques;Passive range of motion;Dry needling;Taping   ? PT Next Visit Plan DN to right QL, gluteals and lateral thigh;  progress hip strength and flexibility   ? PT Home Exercise Plan [email or web address]   ? Consulted and Agree with Plan of Care Patient   ? ?  ?  ? ?  ? ? ?Patient will benefit from skilled therapeutic intervention in order to improve the following deficits and impairments:  Pain, Impaired flexibility, Decreased strength, Decreased activity tolerance, Increased muscle spasms, Difficulty walking ? ?Visit Diagnosis: ?Right hip pain ? ?Muscle weakness (generalized) ? ?Other symptoms and signs involving the musculoskeletal system ? ? ? ? ?Problem List ?Patient Active Problem List  ? Diagnosis Date Noted  ? Postoperative hypothyroidism 03/08/2021  ? Centrilobular emphysema ([place]) 03/08/2021  ? Moderate persistent asthma without complication 37/48/2707  ? Osteoporosis screening 03/08/2021  ? Right hip pain 08/23/2020  ? Anxiety 07/23/2019  ? Diverticulosis 07/23/2019  ? Elevated cholesterol 07/23/2019  ? History of nicotine dependence 07/23/2019  ? Hyperplastic colon polyp 07/23/2019  ? Increased glucose level 07/23/2019  ? Osteoarthritis 07/23/2019  ? Redundant colon 07/23/2019  ? Seasonal allergic rhinitis 07/23/2019  ? Sensorineural hearing loss (SNHL) of both ears 07/23/2019  ? Vitamin D deficiency 07/23/2019  ? Xerosis of skin 07/23/2019  ? Chronic obstructive pulmonary disease ([place]) 07/23/2019  ? Pseudoptosis 09/16/2013  ? Dermatochalasis 09/16/2013  ?PHYSICAL THERAPY DISCHARGE SUMMARY ? ?Visits from Start of Care: 2 ? ?Current functional level related to goals / functional outcomes: ?Improved exercise tolerance ?  ?Remaining deficits: ?See  above ?  ?Education / Equipment: ?HEP  ? ?Patient agrees to discharge. Patient goals were not met. Patient is being discharged due to not returning since the last visit. ? ?Isabelle Course, PT,DPT04/14/231:38 PM ? ? ?Madelyn Flavors, PT ?12/25/2021, 1:07 PM ? ?  [place] ?[facility] ?[place] ?[place], [place], [address] ?Phone: [phone]   Fax:  [phone] ? ?Name: Christine Barry ?MRN: [id number] ?Date of Birth: 1941/08/17 ? ? ? ?

## 2021-12-25 NOTE — Patient Instructions (Addendum)
Trigger Point Dry Needling ? ?What is Trigger Point Dry Needling (DN)? ?DN is a physical therapy technique used to treat muscle pain and dysfunction. Specifically, DN helps deactivate muscle trigger points (muscle knots).  ?A thin filiform needle is used to penetrate the skin and stimulate the underlying trigger point. The goal is for a local twitch response (LTR) to occur and for the trigger point to relax. No medication of any kind is injected during the procedure.  ? ?What Does Trigger Point Dry Needling Feel Like?  ?The procedure feels different for each individual patient. Some patients report that they do not actually feel the needle enter the skin and overall the process is not painful. Very mild bleeding may occur. However, many patients feel a deep cramping in the muscle in which the needle was inserted. This is the local twitch response.  ? ?How Will I feel after the treatment? ?Soreness is normal, and the onset of soreness may not occur for a few hours. Typically this soreness does not last longer than two days.  ?Bruising is uncommon, however; ice can be used to decrease any possible bruising.  ?In rare cases feeling tired or nauseous after the treatment is normal. In addition, your symptoms may get worse before they get better, this period will typically not last longer than 24 hours.  ? ?What Can I do After My Treatment? ?Increase your hydration by drinking more water for the next 24 hours. ?You may place ice or heat on the areas treated that have become sore, however, do not use heat on inflamed or bruised areas. Heat often brings more relief post needling. ?You can continue your regular activities, but vigorous activity is not recommended initially after the treatment for 24 hours. ?DN is best combined with other physical therapy such as strengthening, stretching, and other therapies.  ? ?Access Code: [email or web address] ?URL: [email or web address] ?Date: 12/25/2021 ?Prepared by:  Raynelle Fanning ? ?Exercises ?Seated Transversus Abdominis Bracing - 1 x daily - 7 x weekly - 1 sets - 10 reps - 5 seconds hold ?Seated Hamstring Stretch - 1 x daily - 7 x weekly - 1 sets - 3 reps - 20-30 seconds hold ?Standing 'L' Stretch at Counter - 1 x daily - 7 x weekly - 1 sets - 3 reps - 20-30 seconds hold ?Supine Figure 4 Piriformis Stretch - 1 x daily - 7 x weekly - 1 sets - 3 reps - 20-30 seconds hold ?Hooklying Single Knee to Chest - 1 x daily - 7 x weekly - 1 sets - 3 reps - 20-30 seconds hold ?Hooklying Clamshell with Resistance - 1 x daily - 7 x weekly - 1-2 sets - 10 reps ?Supine Hip Internal and External Rotation - 1 x daily - 7 x weekly - 1 sets - 3 reps - 10 sec hold ?Seated Hip External Rotation AROM - 2 x daily - 7 x weekly - 1 sets - 2 reps - 30-60 sec hold ? ? ?

## 2021-12-27 ENCOUNTER — Encounter: Payer: PPO | Admitting: Physical Therapy

## 2022-01-01 ENCOUNTER — Encounter: Payer: PPO | Admitting: Rehabilitative and Restorative Service Providers"

## 2022-01-03 ENCOUNTER — Encounter: Payer: PPO | Admitting: Physical Therapy

## 2022-01-03 ENCOUNTER — Encounter: Payer: Self-pay | Admitting: Medical-Surgical

## 2022-01-03 NOTE — Telephone Encounter (Signed)
Appt scheduled for 3/30 at 11:30. Per Joy ?

## 2022-01-04 ENCOUNTER — Ambulatory Visit (INDEPENDENT_AMBULATORY_CARE_PROVIDER_SITE_OTHER): Payer: PPO | Admitting: Medical-Surgical

## 2022-01-04 ENCOUNTER — Ambulatory Visit (INDEPENDENT_AMBULATORY_CARE_PROVIDER_SITE_OTHER): Payer: PPO

## 2022-01-04 ENCOUNTER — Encounter: Payer: Self-pay | Admitting: Medical-Surgical

## 2022-01-04 VITALS — BP 115/68 | HR 92 | Resp 18 | Ht 66.0 in | Wt 162.0 lb

## 2022-01-04 DIAGNOSIS — R0989 Other specified symptoms and signs involving the circulatory and respiratory systems: Secondary | ICD-10-CM

## 2022-01-04 DIAGNOSIS — R059 Cough, unspecified: Secondary | ICD-10-CM

## 2022-01-04 DIAGNOSIS — F4321 Adjustment disorder with depressed mood: Secondary | ICD-10-CM | POA: Diagnosis not present

## 2022-01-04 DIAGNOSIS — Z634 Disappearance and death of family member: Secondary | ICD-10-CM

## 2022-01-04 DIAGNOSIS — R0602 Shortness of breath: Secondary | ICD-10-CM | POA: Diagnosis not present

## 2022-01-04 MED ORDER — PREDNISONE 50 MG PO TABS: 50.0000 mg | ORAL_TABLET | Freq: Every day | ORAL | 0 refills | Status: DC

## 2022-01-04 NOTE — Progress Notes (Signed)
?  HPI with pertinent ROS:  ? ?CC: cough/respiratory symptoms ? ?HPI: ?Pleasant 81 year old female presenting today for evaluation of cough and respiratory symptoms.  Notes that on 3/19, she noted that she was having some ill symptoms.  At that time, she started a prednisone taper per the instructions of her previous pulmonologist.  She took 40 mg x 2 days, 30 mg x 2 days, 20 mg x 2 days, and 10 mg x 2 days.  She finished this taper on Tuesday but notes that she continues to feel under the weather.  Her cough has been bothersome and she has been monitoring her oxygen levels at home with readings of 90 to 91%.  She has been using her nebulizer on an as-needed basis which she does feel is somewhat helpful.  She has azithromycin on hand that was also prescribed by her previous pulmonologist due to her complicated respiratory situation.  She wanted to check with our office before taking the antibiotics.  She has had no fevers or chills. ? ?Reports that her son committed suicide in her home of approximately 1 week ago.  She was home at the time and was the one who found him.  She is understandably grieving at the loss but handling this well so far.  She has an extensive support group including her church family as well as her daughter/grandchildren.  Family will be coming in for the services scheduled over the weekend knows that she has to be strong for them.  Denies SI/HI.  Reports that she does not want a referral to counseling or psychiatry at this point but will let me know if her coping skills are not adequate to deal with the situation. ? ?I reviewed the past medical history, family history, social history, surgical history, and allergies today and no changes were needed.  Please see the problem list section below in epic for further details. ? ? ?Physical exam:  ? ?General: Well Developed, well nourished, and in no acute distress.  ?Neuro: Alert and oriented x3.  ?HEENT: Normocephalic, atraumatic.  ?Skin: Warm and  dry. ?Cardiac: Regular rate and rhythm, no murmurs rubs or gallops, no lower extremity edema.  ?Respiratory: Diffuse expiratory wheezing throughout all lung fields.  Not using accessory muscles, speaking in full sentences. ? ?Impression and Recommendations:   ? ?1. Respiratory symptoms ?Chest x-ray today.  Adding prednisone 50 mg x 5 days.  Go ahead and start a azithromycin as prescribed.  Continue albuterol nebulizer but recommend doing this a couple of times daily for the next few days. ? ?2. Grief at loss of child ?Per our conversation, she is coping well for now.  She will let me know if she would like to have a referral to counseling or psychiatry. ? ?Return if symptoms worsen or fail to improve. ?___________________________________________ ?Thayer Ohm, DNP, APRN, FNP-BC ?Primary Care and Sports Medicine ?[facility] ?

## 2022-01-08 ENCOUNTER — Encounter: Payer: PPO | Admitting: Rehabilitative and Restorative Service Providers"

## 2022-01-10 ENCOUNTER — Encounter: Payer: PPO | Admitting: Physical Therapy

## 2022-01-17 ENCOUNTER — Telehealth: Payer: Self-pay

## 2022-01-17 DIAGNOSIS — J302 Other seasonal allergic rhinitis: Secondary | ICD-10-CM

## 2022-01-17 MED ORDER — MONTELUKAST SODIUM 10 MG PO TABS: 10.0000 mg | ORAL_TABLET | Freq: Every day | ORAL | 3 refills | Status: DC

## 2022-01-17 NOTE — Telephone Encounter (Signed)
Christine Barry called and left a message stating she is still sick. She is requesting another round of prednisone and a prescription for Singulair. Please advise.  ?

## 2022-01-17 NOTE — Telephone Encounter (Signed)
Called and advised pt of recommendations. Prescription called in for Singulair.  ?

## 2022-01-19 DIAGNOSIS — J441 Chronic obstructive pulmonary disease with (acute) exacerbation: Secondary | ICD-10-CM | POA: Diagnosis not present

## 2022-02-18 DIAGNOSIS — J441 Chronic obstructive pulmonary disease with (acute) exacerbation: Secondary | ICD-10-CM | POA: Diagnosis not present

## 2022-03-15 DIAGNOSIS — L649 Androgenic alopecia, unspecified: Secondary | ICD-10-CM | POA: Diagnosis not present

## 2022-03-26 ENCOUNTER — Other Ambulatory Visit: Payer: Self-pay | Admitting: Medical-Surgical

## 2022-04-20 ENCOUNTER — Telehealth: Payer: Self-pay

## 2022-04-20 NOTE — Telephone Encounter (Signed)
-----   Message from Chi Mechele Collin, MD sent at 04/20/2022  8:31 AM EDT ----- Regarding: Overdue follow-up

## 2022-04-20 NOTE — Telephone Encounter (Signed)
Called patient this morning and got her scheduled for follow up   May 15, 2022 11am  Dr Everardo All  Nothing further needed

## 2022-04-24 ENCOUNTER — Other Ambulatory Visit: Payer: Self-pay | Admitting: Family Medicine

## 2022-04-28 ENCOUNTER — Other Ambulatory Visit: Payer: Self-pay | Admitting: Medical-Surgical

## 2022-04-28 DIAGNOSIS — J302 Other seasonal allergic rhinitis: Secondary | ICD-10-CM

## 2022-05-03 ENCOUNTER — Other Ambulatory Visit: Payer: Self-pay | Admitting: Medical-Surgical

## 2022-05-03 DIAGNOSIS — E890 Postprocedural hypothyroidism: Secondary | ICD-10-CM

## 2022-05-08 ENCOUNTER — Encounter: Payer: Self-pay | Admitting: Medical-Surgical

## 2022-05-15 ENCOUNTER — Ambulatory Visit: Payer: PPO | Admitting: Pulmonary Disease

## 2022-05-15 ENCOUNTER — Encounter: Payer: Self-pay | Admitting: Pulmonary Disease

## 2022-05-15 VITALS — BP 122/60 | HR 75 | Temp 97.8°F | Ht 66.0 in | Wt 168.2 lb

## 2022-05-15 DIAGNOSIS — J441 Chronic obstructive pulmonary disease with (acute) exacerbation: Secondary | ICD-10-CM | POA: Diagnosis not present

## 2022-05-15 MED ORDER — ALBUTEROL SULFATE HFA 108 (90 BASE) MCG/ACT IN AERS: 2.0000 | INHALATION_SPRAY | Freq: Four times a day (QID) | RESPIRATORY_TRACT | 1 refills | Status: DC | PRN

## 2022-05-15 MED ORDER — FLUTICASONE-UMECLIDIN-VILANT 100-62.5-25 MCG/ACT IN AEPB: 1.0000 | INHALATION_SPRAY | Freq: Every day | RESPIRATORY_TRACT | 11 refills | Status: DC

## 2022-05-15 NOTE — Progress Notes (Signed)
Subjective:   PATIENT ID: Christine Barry GENDER: female DOB: 1940/12/06, MRN: [id number]   HPI  Chief Complaint  Patient presents with   Follow-up    Pt states she has been doing okay since last visit and denies any complaints.   Reason for Visit: follow-up  Christine Barry is a 81 year old female former smoker with COPD who presents for follow-up.  Synopsis: She was diagnosed with COPD in 2016 and followed by a Pulmonogist, Dr. Gust Rung in [place]. She moved to be near her daughter in [place], [place]. At baseline, she is active and enjoys going to the farmer's market weekly. Denies coughing shortness of breath and wheezing. She is compliant with her bronchodilators with Dulera and rarely used her albuterol. In 2021 she had a prolonged exacerbation requiring 2-3 months of steroids.  04/25/21 She established care at [facility] in 04/2021 and started treatment for COPD exacerbation. COVID-19 neg x 3 prior to her appointment. On one week follow-up today she reports one to two good days but has persistent shortness of breath, wheezing cough. During the prednisone taper her symptom seem to have recurred. Symptoms are worse in the mornings. After nebulizer treatments she will have a productive cough. Her oxygen levels have dropped to 89% at home with activity. Does not have supplemental O2. She has been compliant with Duonebs QID and symbicort twice daily.   07/06/21 In July she was treated for COPD exacerbation and then started on Trelegy 100 on follow-up visit with Dr. Annamaria Boots. She rarely uses albuterol except before exercise. She recently broke her hip has limited her activity. She is doing yoga, light cardio and resistance exercises at [employer]. No longer wearing oxygen as she feels she is doing better.   05/15/22 She reports her COPD is well controlled. Uses albuterol once a day prior to exercise at [employer] including cardio, weights and yoga. She was last treated  with prednisone and azithromycin in March 2023. No exacerbations since then. Denies shortness of breath, cough, wheezing. Her son committed suicide in her home March. She tries to be active in her family's lives and work on being healthy. Is not in counseling but will share her grief with anyone including friends and neighbors. She believes she is compartmentalizing her feelings and has been advised by her sister to seek counseling for her guilt and grief.  Social History: Former smoker. Quit in 2001. 76 pack-years  Past Medical History:  Diagnosis Date   Asthma    COPD (chronic obstructive pulmonary disease) (HCC)    Hyperlipidemia    Thyroid disease     Allergies  Allergen Reactions   Penicillins Other (See Comments)    Not sure (occurred in the 1970's), but thinks she "passed out"   Sulfa Antibiotics      Outpatient Medications Prior to Visit  Medication Sig Dispense Refill   ALPRAZolam (XANAX) 0.25 MG tablet Take 0.25 mg by mouth at bedtime as needed for anxiety.     cetirizine (ZYRTEC) 10 MG tablet Take 10 mg by mouth daily as needed.     Cholecalciferol 50 MCG (2000 UT) TABS Take 2,000 Units by mouth daily.     cyanocobalamin 1000 MCG tablet Take 1,000 mcg by mouth 3 (three) times a week.     EUTHYROX 200 MCG tablet TAKE 1 TABLET BY MOUTH BEFORE BREAKFAST EXCEPT  FOR  2ND  AND  4TH  SUNDAY  OF  THE  MONTH/ needs labs 30 tablet 0  Ferrous Sulfate (IRON PO) Take by mouth. Nature made     finasteride (PROSCAR) 5 MG tablet Take 5 mg by mouth daily.     meloxicam (MOBIC) 15 MG tablet Take 1 tablet by mouth once daily 30 tablet 0   montelukast (SINGULAIR) 10 MG tablet TAKE 1 TABLET BY MOUTH AT BEDTIME 90 tablet 1   simvastatin (ZOCOR) 20 MG tablet Take 1 tablet (20 mg total) by mouth at bedtime. 90 tablet 1   Fluticasone-Umeclidin-Vilant (TRELEGY ELLIPTA) 100-62.5-25 MCG/ACT AEPB Inhale 1 puff into the lungs daily. 1 each 6   predniSONE (DELTASONE) 50 MG tablet Take 1 tablet (50 mg  total) by mouth daily. 5 tablet 0   No facility-administered medications prior to visit.    Review of Systems  Constitutional:  Negative for chills, diaphoresis, fever, malaise/fatigue and weight loss.  HENT:  Negative for congestion.   Respiratory:  Negative for cough, hemoptysis, sputum production, shortness of breath and wheezing.   Cardiovascular:  Negative for chest pain, palpitations and leg swelling.     Objective:   Vitals:   05/15/22 1058  BP: 122/60  Pulse: 75  Temp: 97.8 F (36.6 C)  TempSrc: Oral  SpO2: 94%  Weight: 168 lb 3.2 oz (76.3 kg)  Height: 5\' 6"  (1.676 m)   SpO2: 94 % (RA) O2 Device: None (Room air)  Physical Exam: General: Well-appearing, no acute distress HENT: [place], AT Eyes: EOMI, no scleral icterus Respiratory: Clear to auscultation bilaterally.  No crackles, wheezing or rales Cardiovascular: RRR, -M/R/G, no JVD Extremities:-Edema,-tenderness Neuro: AAO x4, CNII-XII grossly intact Psych: Normal mood, normal affect  Data Reviewed:  Imaging: CXR 04/18/21 - No acute infiltrate, effusion or edema  PFT: OSH 2019 FEV1 0.8. TGV 174% DLCO 49%. Positive bronchodilator response. Interpretation: Severe obstructive defect with hyperinflation and reduced gas exchange consistent with emphysema.  03/18/2020 spirometry: FEV1 0.54, 24%, FVC 1.3, ratio 0.41.   12/26/2020: FEV1 0.6, 25%, FVC 1.3, ratio 0.44.  Interpretation: Very severe obstructive defect   Labs: OSH 2016 alpha-1 MS phenotype with a level of 135   CBC    Component Value Date/Time   WBC 7.0 06/08/2021 0000   RBC 4.64 06/08/2021 0000   HGB 13.5 06/08/2021 0000   HCT 43.0 06/08/2021 0000   PLT 154 06/08/2021 0000   MCV 92.7 06/08/2021 0000   MCH 29.1 06/08/2021 0000   MCHC 31.4 (L) 06/08/2021 0000   RDW 13.1 06/08/2021 0000   LYMPHSABS 1,106 06/08/2021 0000   EOSABS 560 (H) 06/08/2021 0000   BASOSABS 63 06/08/2021 0000  Absolute 06/08/21 - 560  Assessment & Plan:    Discussion: 81 year old female former smoker with very severe COPD (FEV1 25%) who presents for follow-up. Last COPD exacerbation in 01/2022. Controlled on low dose Trelegy. Benefits from ICS in setting peripheral eosinophilia. Discussed clinical course and management of COPD/asthma including bronchodilator regimen and action plan for exacerbation.  Severe COPD/asthma overlaop --CONTINUE Trelegy 100 ONE puff ONCE a day --CONTINUE montelukast 10 mg --CONTINUE Albuterol AS NEEDED for shortness of breath or wheezing --CONTINUE Duonebs AS NEEDED  for shortness of breath or wheezing --Use flutter valve AS NEEDED for mucous clearance  Allergic Rhinitis --CONTINUE Flonase 1 spray twice a day as needed  Grief --Discussed counseling as an outlet and reviewed online resources with patient --Declined referrals at this time  Health Maintenance Immunization History  Administered Date(s) Administered   Influenza Split 08/02/2010, 08/09/2016, 07/15/2017, 07/18/2018   Influenza, High Dose Seasonal PF 07/07/2015, 08/10/2016,  07/23/2019   Influenza, Quadrivalent, Recombinant, Inj, Pf 07/18/2018   Influenza-Unspecified 06/26/2011, 07/21/2020   PFIZER(Purple Top)SARS-COV-2 Vaccination 11/02/2019, 11/23/2019, 07/05/2020, 07/01/2021, 05/08/2022   Pneumococcal Conjugate-13 10/08/2005, 06/21/2014, 07/18/2018, 07/22/2018   Pneumococcal Polysaccharide-23 06/04/2019   Pneumococcal-Unspecified 10/08/2005   Tdap 03/01/2018   Zoster Recombinat (Shingrix) 12/16/2017, 05/05/2018   Zoster, Live 08/02/2011   CT Lung Screen - not appropriate age  No orders of the defined types were placed in this encounter.  Meds ordered this encounter  Medications   albuterol (VENTOLIN HFA) 108 (90 Base) MCG/ACT inhaler    Sig: Inhale 2 puffs into the lungs every 6 (six) hours as needed for wheezing or shortness of breath.    Dispense:  8 g    Refill:  1   Fluticasone-Umeclidin-Vilant (TRELEGY ELLIPTA) 100-62.5-25 MCG/ACT  AEPB    Sig: Inhale 1 puff into the lungs daily.    Dispense:  1 each    Refill:  11    Return in about 1 year (around 05/16/2023).  I have spent a total time of 35-minutes on the day of the appointment including chart review, data review, collecting history, coordinating care and discussing medical diagnosis and plan with the patient/family. Past medical history, allergies, medications were reviewed. Pertinent imaging, labs and tests included in this note have been reviewed and interpreted independently by me.  Random Dobrowski Mechele Collin, MD [facility] 05/15/2022 11:30 AM  Office Number [phone]

## 2022-05-15 NOTE — Patient Instructions (Signed)
Severe COPD/asthma overlaop --CONTINUE Trelegy 100 ONE puff ONCE a day --CONTINUE montelukast 10 mg --CONTINUE Albuterol AS NEEDED for shortness of breath or wheezing --CONTINUE Duonebs AS NEEDED  for shortness of breath or wheezing --Use flutter valve AS NEEDED for mucous clearance  Allergic Rhinitis --CONTINUE Flonase 1 spray twice a day as needed  Follow-up with me in 1 year

## 2022-05-25 ENCOUNTER — Other Ambulatory Visit: Payer: Self-pay | Admitting: Medical-Surgical

## 2022-05-26 ENCOUNTER — Other Ambulatory Visit: Payer: Self-pay | Admitting: Medical-Surgical

## 2022-05-29 ENCOUNTER — Other Ambulatory Visit: Payer: Self-pay | Admitting: Medical-Surgical

## 2022-05-29 DIAGNOSIS — E890 Postprocedural hypothyroidism: Secondary | ICD-10-CM

## 2022-05-31 IMAGING — MG MM DIGITAL SCREENING BILAT W/ TOMO AND CAD
8 series · 8 of 24 positions shown · non-contrast
Comparison: None.

CLINICAL DATA: Screening.

EXAM:
DIGITAL SCREENING BILATERAL MAMMOGRAM WITH TOMOSYNTHESIS AND CAD
TECHNIQUE: Bilateral screening digital craniocaudal and mediolateral oblique
mammograms were obtained. Bilateral screening digital breast
tomosynthesis was performed. The images were evaluated with
computer-aided detection.

[L MLO synth-2D]
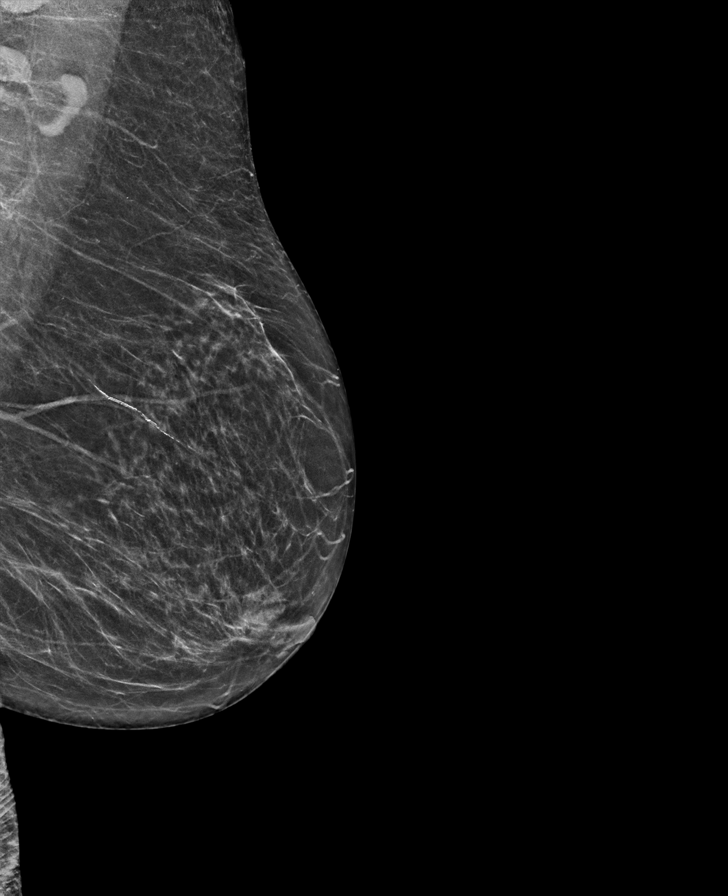

[R MLO synth-2D]
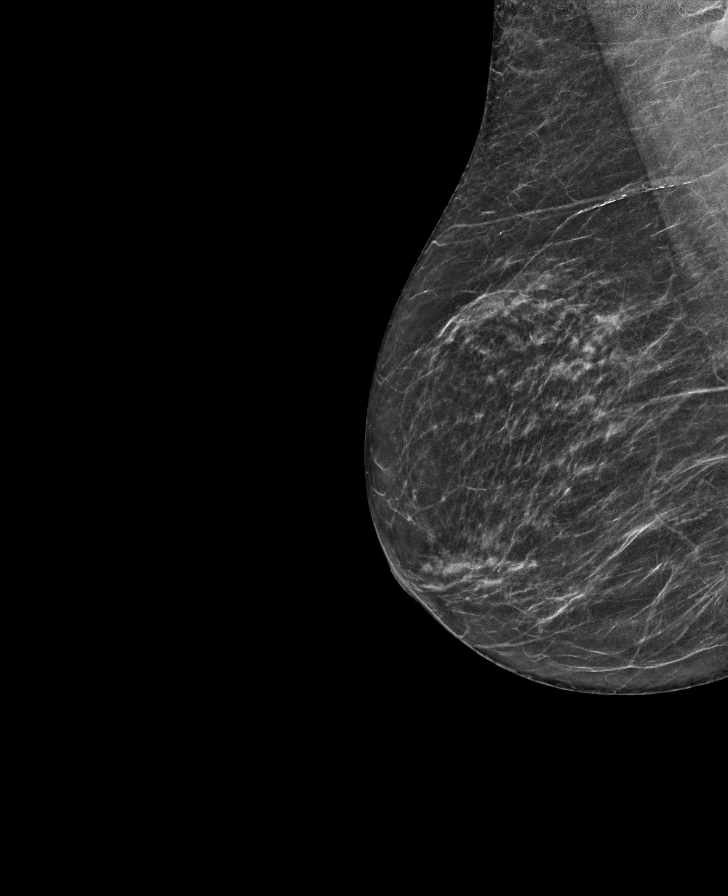

[R CC synth-2D]
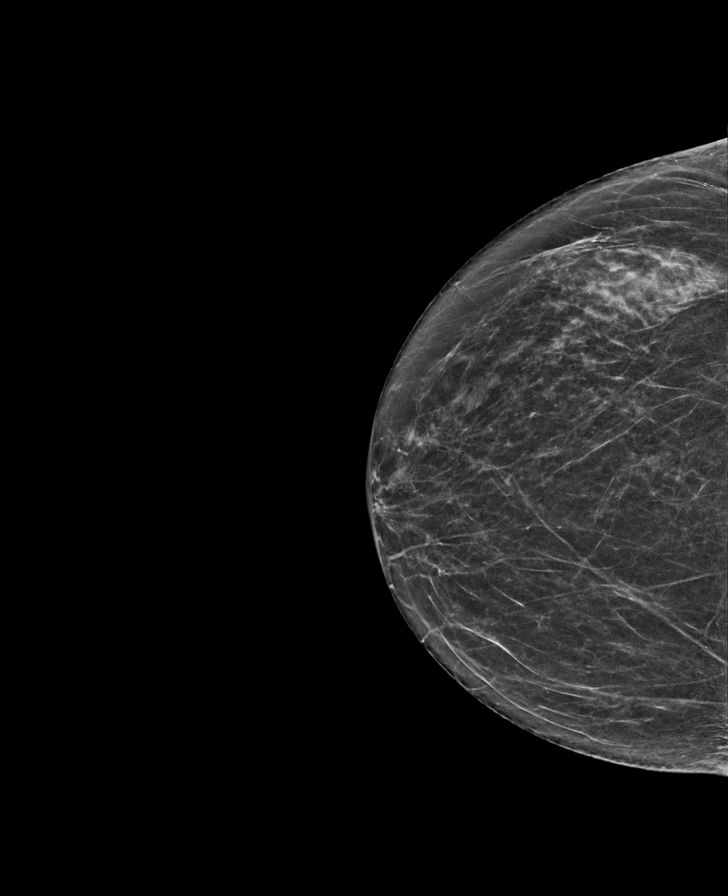

[L CC synth-2D]
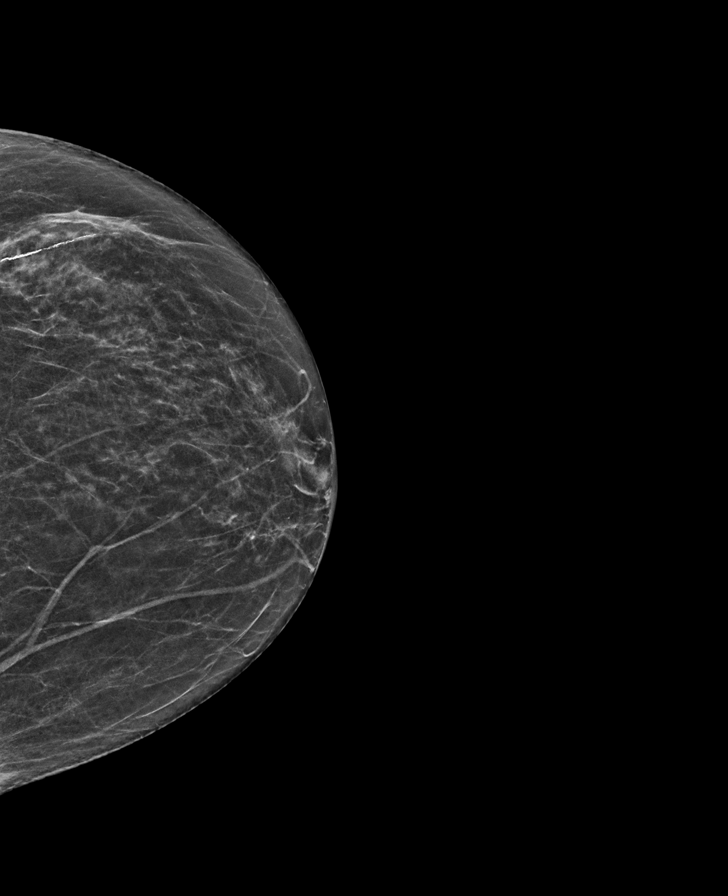

[R CC tomo · tomo slice 29/56.0]
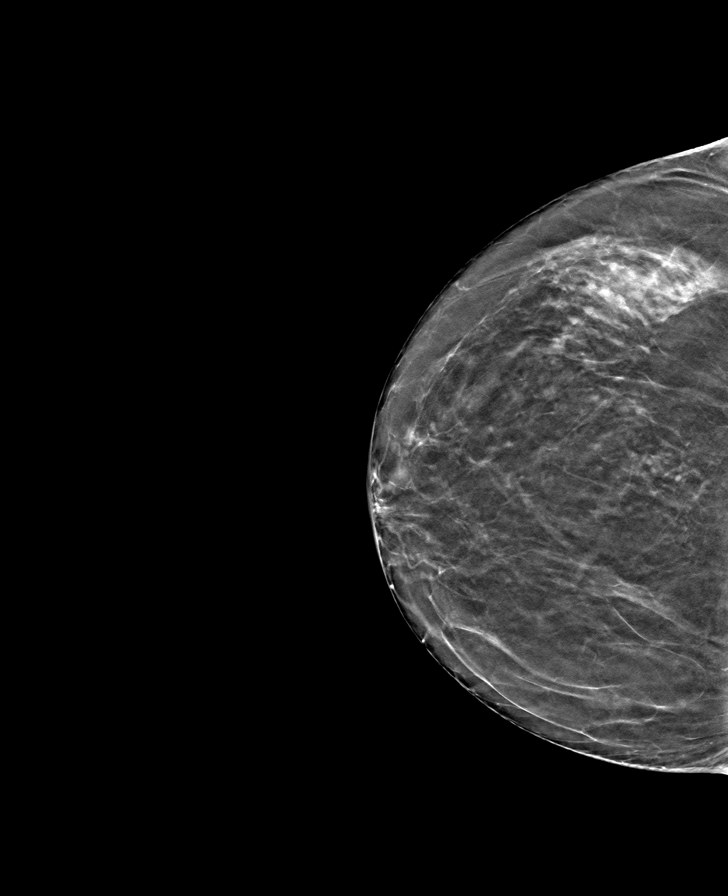

[L CC tomo · tomo slice 27/52.0]
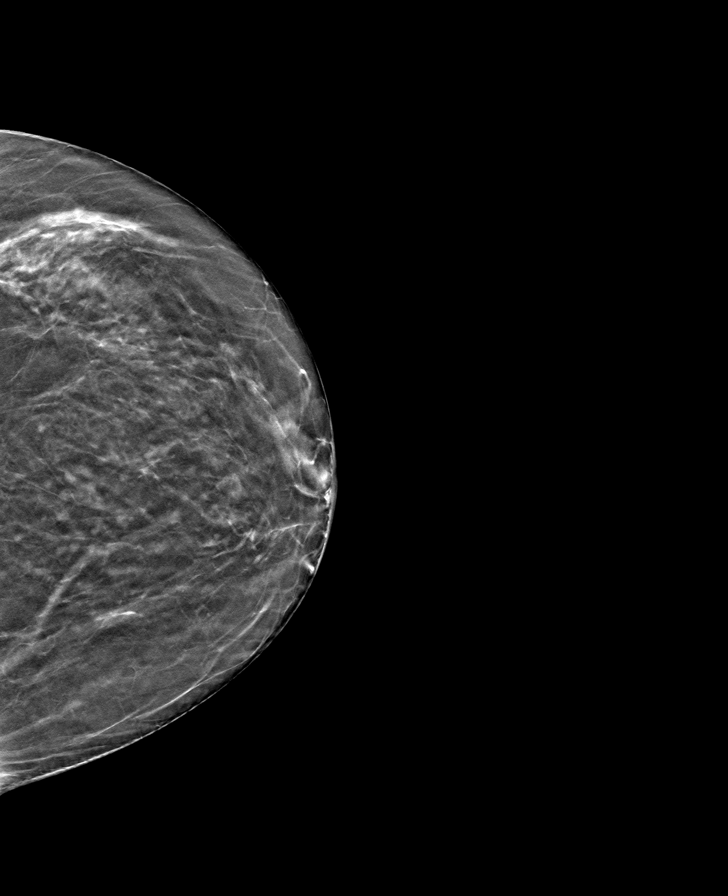

[R MLO tomo · tomo slice 31/60.0]
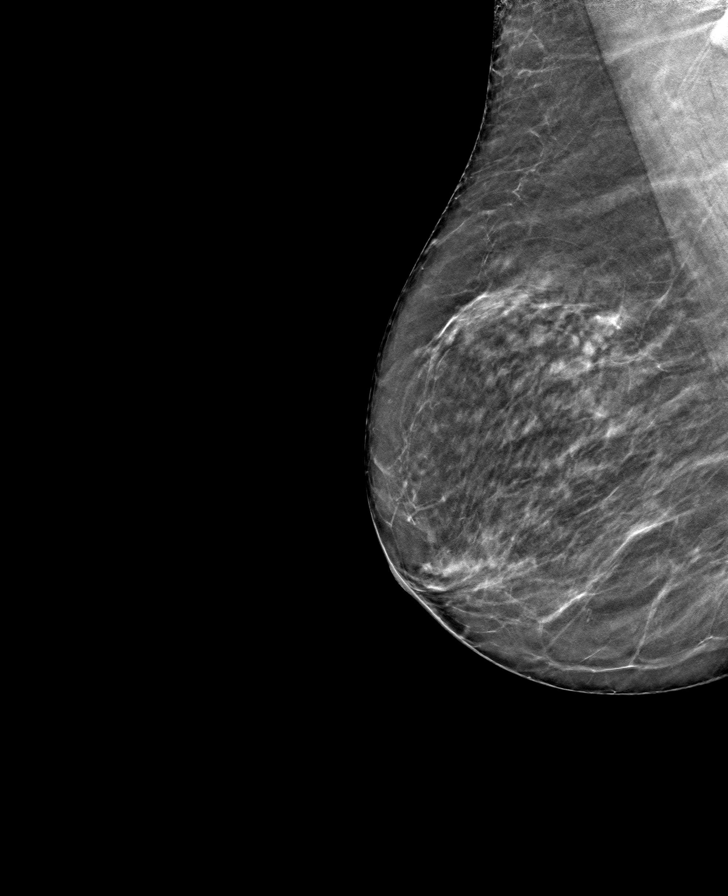

[L MLO tomo · tomo slice 31/62.0]
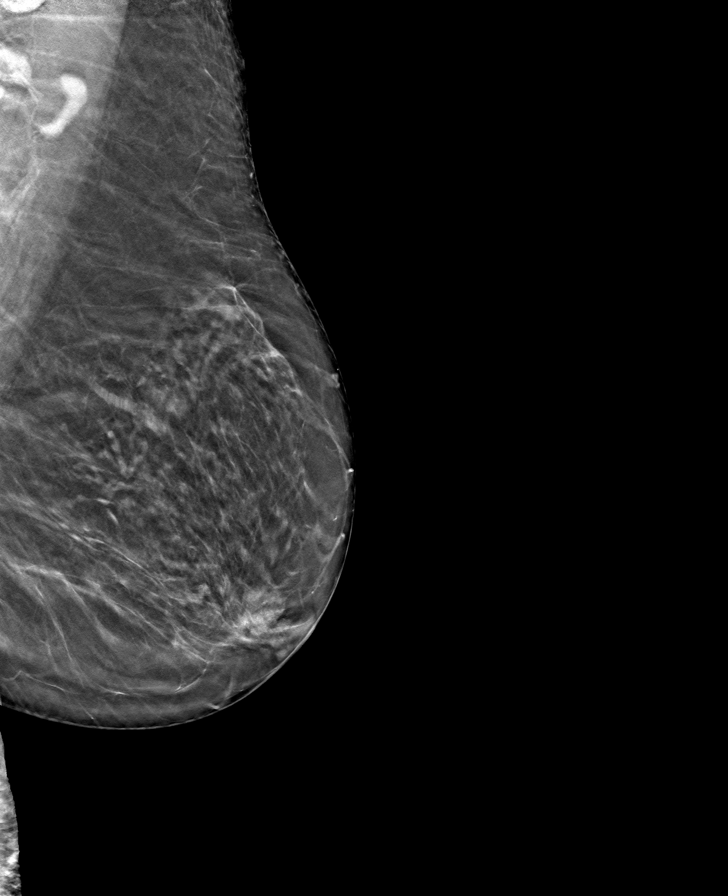

[8 of 24 positions shown; findings below may reference images not displayed]

ACR Breast Density Category b: There are scattered areas of
fibroglandular density.
FINDINGS: There are no findings suspicious for malignancy.
IMPRESSION: No mammographic evidence of malignancy. A result letter of this
screening mammogram will be mailed directly to the patient.

RECOMMENDATION:
Screening mammogram in one year. (Code:[id number]-X-X7B)

BI-RADS CATEGORY  1: Negative.

## 2022-06-08 ENCOUNTER — Encounter: Payer: Self-pay | Admitting: Family Medicine

## 2022-06-08 ENCOUNTER — Ambulatory Visit (INDEPENDENT_AMBULATORY_CARE_PROVIDER_SITE_OTHER): Payer: PPO | Admitting: Family Medicine

## 2022-06-08 VITALS — BP 120/70 | HR 81 | Ht 66.0 in | Wt 170.0 lb

## 2022-06-08 DIAGNOSIS — Z634 Disappearance and death of family member: Secondary | ICD-10-CM | POA: Diagnosis not present

## 2022-06-08 DIAGNOSIS — R635 Abnormal weight gain: Secondary | ICD-10-CM

## 2022-06-08 DIAGNOSIS — F4321 Adjustment disorder with depressed mood: Secondary | ICD-10-CM | POA: Insufficient documentation

## 2022-06-08 DIAGNOSIS — J3089 Other allergic rhinitis: Secondary | ICD-10-CM | POA: Diagnosis not present

## 2022-06-08 NOTE — Assessment & Plan Note (Signed)
-   have sent referral for [place] [employer] for grief counseling - pt doing as well as she can with keeping busy  - continuing to try and meet friends and exercise

## 2022-06-08 NOTE — Assessment & Plan Note (Signed)
-   recommended OTC xyzal and nasal saline wash

## 2022-06-08 NOTE — Patient Instructions (Signed)
Arm and Hammer Saline Nasal wash--daily nasal rinse to help with congestion   Xyzal

## 2022-06-08 NOTE — Progress Notes (Signed)
Established patient visit   Patient: Christine Barry   DOB: Aug 25, 1941   81 y.o. Female  MRN: [id number] Visit Date: 06/08/2022  Today's healthcare provider: Charlton Amor, DO   Chief Complaint  Patient presents with   grief    SUBJECTIVE    Chief Complaint  Patient presents with   grief   HPI  She has a history of COPD with asthma. She is doing well with her medications and has been compliant. She recently saw pulmonology and is doing well.   She is still dealing with grief of her son. She is interested in counseling today. She has been trying to encouraged herself to do things and has joined the [employer] in [place] and goes to yoga weekly.   Pt has concerns of weight gain today as she has noticed she has gained weight recently. From epic review she has gained about 8lbs in the last few months.   Review of Systems  Constitutional:  Negative for activity change, fatigue and fever.  Respiratory:  Negative for cough and shortness of breath.   Cardiovascular:  Negative for chest pain.  Gastrointestinal:  Negative for abdominal pain.  Genitourinary:  Negative for difficulty urinating.      Current Meds  Medication Sig   albuterol (VENTOLIN HFA) 108 (90 Base) MCG/ACT inhaler Inhale 2 puffs into the lungs every 6 (six) hours as needed for wheezing or shortness of breath.   ALPRAZolam (XANAX) 0.25 MG tablet Take 0.25 mg by mouth at bedtime as needed for anxiety.   cetirizine (ZYRTEC) 10 MG tablet Take 10 mg by mouth daily as needed.   Cholecalciferol 50 MCG (2000 UT) TABS Take 2,000 Units by mouth daily.   cyanocobalamin 1000 MCG tablet Take 1,000 mcg by mouth 3 (three) times a week.   EUTHYROX 200 MCG tablet TAKE 1 TABLET BY MOUTH ONCE DAILY BEFORE BREAKFAST EXCEPT  THE  2ND  AND  4TH  SUNDAY  OF  THE  MONTH   Ferrous Sulfate (IRON PO) Take by mouth. Nature made   finasteride (PROSCAR) 5 MG tablet Take 5 mg by mouth daily.   Fluticasone-Umeclidin-Vilant (TRELEGY  ELLIPTA) 100-62.5-25 MCG/ACT AEPB Inhale 1 puff into the lungs daily.   meloxicam (MOBIC) 15 MG tablet Take 1 tablet by mouth once daily   montelukast (SINGULAIR) 10 MG tablet TAKE 1 TABLET BY MOUTH AT BEDTIME   simvastatin (ZOCOR) 20 MG tablet Take 1 tablet (20 mg total) by mouth at bedtime.    OBJECTIVE    BP 120/70   Pulse 81   Ht 5\' 6"  (1.676 m)   Wt 170 lb (77.1 kg)   SpO2 93%   BMI 27.44 kg/m   Physical Exam Vitals and nursing note reviewed.  Constitutional:      General: She is not in acute distress.    Appearance: Normal appearance.  HENT:     Head: Normocephalic and atraumatic.     Right Ear: External ear normal.     Left Ear: External ear normal.     Nose: Nose normal.     Mouth/Throat:     Comments: Post nasal drip Eyes:     Conjunctiva/sclera: Conjunctivae normal.  Cardiovascular:     Rate and Rhythm: Normal rate and regular rhythm.  Pulmonary:     Effort: Pulmonary effort is normal.     Breath sounds: Normal breath sounds. No wheezing.  Neurological:     General: No focal deficit present.  Mental Status: She is alert and oriented to person, place, and time.  Psychiatric:        Mood and Affect: Mood normal.        Behavior: Behavior normal.        Thought Content: Thought content normal.        Judgment: Judgment normal.        ASSESSMENT & PLAN    Problem List Items Addressed This Visit       Respiratory   Seasonal allergic rhinitis    - recommended OTC xyzal and nasal saline wash         Other   Weight gain    - ordered TSH - could be due grief  - will continue to monitor food intake, continue exercise      Relevant Orders   TSH + free T4   Grief at loss of child - Primary    - have sent referral for [place] [employer] for grief counseling - pt doing as well as she can with keeping busy  - continuing to try and meet friends and exercise      Relevant Orders   Ambulatory referral to Psychology    Return in  about 3 months (around 09/07/2022).      No orders of the defined types were placed in this encounter.   Orders Placed This Encounter  Procedures   TSH + free T4   Ambulatory referral to Psychology    Referral Priority:   Routine    Referral Type:   Psychiatric    Referral Reason:   Specialty Services Required    Requested Specialty:   Psychology    Number of Visits Requested:   1     Charlton Amor, DO  [facility] [phone] (phone) [phone] (fax)  [facility]

## 2022-06-08 NOTE — Assessment & Plan Note (Addendum)
-   ordered TSH - could be due grief  - will continue to monitor food intake, continue exercise

## 2022-06-09 LAB — TSH+FREE T4: TSH W/REFLEX TO FT4: 0.33 mIU/L — ABNORMAL LOW (ref 0.40–4.50)

## 2022-06-09 LAB — T4, FREE: Free T4: 1.7 ng/dL (ref 0.8–1.8)

## 2022-06-12 ENCOUNTER — Other Ambulatory Visit: Payer: Self-pay | Admitting: Medical-Surgical

## 2022-06-12 DIAGNOSIS — E890 Postprocedural hypothyroidism: Secondary | ICD-10-CM

## 2022-07-03 ENCOUNTER — Other Ambulatory Visit: Payer: Self-pay | Admitting: Medical-Surgical

## 2022-07-04 NOTE — Telephone Encounter (Signed)
Last office visit 01/04/2022 Christine Barry  Last office visit 06/08/2022 with [name] for grief of loss of child  Last filled 05/29/2022  Is it okay to refill?

## 2022-07-06 NOTE — Telephone Encounter (Signed)
TVT changed provider and notified patient of change, MAJ called and scheduled her sooner so she can be seen before her last medication refill runs out (moved from 12/1 to 11/2). Buel Ream

## 2022-07-08 ENCOUNTER — Other Ambulatory Visit: Payer: Self-pay | Admitting: Medical-Surgical

## 2022-07-08 DIAGNOSIS — E890 Postprocedural hypothyroidism: Secondary | ICD-10-CM

## 2022-07-09 NOTE — Telephone Encounter (Signed)
Last office visit 01/04/2022 Christine Barry  Last office visit 06/08/2022  Future appointment 08/09/2022  Last filled 05/31/2022  Okay to refill?

## 2022-07-19 ENCOUNTER — Encounter: Payer: Self-pay | Admitting: Medical-Surgical

## 2022-07-20 ENCOUNTER — Ambulatory Visit (INDEPENDENT_AMBULATORY_CARE_PROVIDER_SITE_OTHER): Payer: PPO | Admitting: Medical-Surgical

## 2022-07-20 ENCOUNTER — Encounter: Payer: Self-pay | Admitting: Medical-Surgical

## 2022-07-20 VITALS — BP 122/67 | HR 75 | Temp 98.0°F | Resp 20 | Ht 66.0 in | Wt 170.3 lb

## 2022-07-20 DIAGNOSIS — E890 Postprocedural hypothyroidism: Secondary | ICD-10-CM | POA: Diagnosis not present

## 2022-07-20 DIAGNOSIS — H00015 Hordeolum externum left lower eyelid: Secondary | ICD-10-CM

## 2022-07-20 DIAGNOSIS — Z79899 Other long term (current) drug therapy: Secondary | ICD-10-CM

## 2022-07-20 DIAGNOSIS — E7800 Pure hypercholesterolemia, unspecified: Secondary | ICD-10-CM | POA: Diagnosis not present

## 2022-07-20 DIAGNOSIS — R0981 Nasal congestion: Secondary | ICD-10-CM | POA: Diagnosis not present

## 2022-07-20 MED ORDER — MELOXICAM 15 MG PO TABS: 15.0000 mg | ORAL_TABLET | Freq: Every day | ORAL | 1 refills | Status: DC

## 2022-07-20 MED ORDER — ERYTHROMYCIN 5 MG/GM OP OINT: 1.0000 | TOPICAL_OINTMENT | Freq: Three times a day (TID) | OPHTHALMIC | 0 refills | Status: AC

## 2022-07-20 MED ORDER — EUTHYROX 200 MCG PO TABS: ORAL_TABLET | ORAL | 1 refills | Status: DC

## 2022-07-20 MED ORDER — SIMVASTATIN 20 MG PO TABS
20.0000 mg | ORAL_TABLET | Freq: Every day | ORAL | 0 refills | Status: DC
Start: 2022-07-20 — End: 2022-07-23

## 2022-07-20 MED ORDER — IPRATROPIUM BROMIDE 0.03 % NA SOLN: 2.0000 | Freq: Two times a day (BID) | NASAL | 0 refills | Status: DC | PRN

## 2022-07-20 NOTE — Progress Notes (Signed)
Established Patient Office Visit  Subjective   Patient ID: Christine Barry, female    DOB: 03-02-1941  Age: 81 y.o. MRN: [id number]  Chief Complaint  Patient presents with   Medication Refill    HPI  Christine Barry is here today for medication refill on meloxicam 15 mg tablet. She reported that she took this last about a week ago which helped greatly with the pain on the lateral side of her right upper thigh. She reports that the medication helps with her pain greatly.   She is also complaining of pain and irritation on her left lower eyelid, stated that it feels sore and a sensation that theres something there. She denies any excessive drainage or dryness, denies any vision changes. She reported that this started around Tuesday. She complaint of nasal congestion and post nasal drip that's making her cough. Denies fever, chills and shortness of breath.   Hypothyroidism: taking Euthyrox 200mg  daily except the 2nd and 4th Sundays of the month. Recent TSH low at 0.33 but T4 normal. Was advised to continue current regimen and recheck in 3 months. No concerning symptoms today.   Taking Simvastatin 20mg  daily, tolerating well without side effects. Following a low fat diet. Stays active as tolerated.   Review of Systems  Constitutional: Negative.   HENT:  Positive for congestion and hearing loss.   Eyes:  Negative for blurred vision, double vision, photophobia, pain, discharge and redness.  Respiratory:  Positive for cough. Negative for hemoptysis, sputum production, shortness of breath and wheezing.   Cardiovascular: Negative.   Musculoskeletal:  Positive for joint pain.  Skin: Negative.   Neurological: Negative.   Psychiatric/Behavioral: Negative.      Objective:     BP 122/67   Pulse 75   Temp 98 F (36.7 C)   Resp 20   Ht 5\' 6"  (1.676 m)   Wt 77.2 kg   SpO2 92%   BMI 27.49 kg/m    Physical Exam Constitutional:      General: She is not in acute distress.     Appearance: Normal appearance. She is not ill-appearing, toxic-appearing or diaphoretic.  HENT:     Head: Normocephalic and atraumatic.     Right Ear: Tympanic membrane normal.     Left Ear: Tympanic membrane normal.     Nose: Congestion and rhinorrhea present.     Mouth/Throat:     Mouth: Mucous membranes are moist.     Pharynx: Oropharynx is clear. No oropharyngeal exudate or posterior oropharyngeal erythema.  Eyes:     Conjunctiva/sclera: Conjunctivae normal.     Pupils: Pupils are equal, round, and reactive to light.     Comments: + Redness bottom left eyelid.   Cardiovascular:     Rate and Rhythm: Normal rate and regular rhythm.     Pulses: Normal pulses.     Heart sounds: Normal heart sounds.  Pulmonary:     Effort: Pulmonary effort is normal. No respiratory distress.     Breath sounds: Normal breath sounds. No stridor. No wheezing, rhonchi or rales.  Chest:     Chest wall: No tenderness.  Skin:    General: Skin is warm and dry.     Capillary Refill: Capillary refill takes less than 2 seconds.  Neurological:     General: No focal deficit present.     Mental Status: She is alert and oriented to person, place, and time. Mental status is at baseline.  Psychiatric:  Mood and Affect: Mood normal.        Behavior: Behavior normal.        Thought Content: Thought content normal.        Judgment: Judgment normal.   No results found for any visits on 07/20/22.    The 10-year ASCVD risk score (Arnett DK, et al., 2019) is: 23.1%    Assessment & Plan:   1. Postoperative hypothyroidism Her most latest TSH are slightly low but given that her T4 is within range, we will go ahead and continue her Euthyrox 200 mcg daily and re-check thyroid function studies as scheduled.   - EUTHYROX 200 MCG tablet; TAKE 1 TABLET BY MOUTH ONCE DAILY BEFORE BREAKFAST EXCEPT  THE  2ND  AND  4TH  SUNDAY  OF  THE  MONTH  Dispense: 90 tablet; Refill: 1  2. Sinus congestion We sent some  prescription for ipratropium (Atrovent) 0.03% nasal spray 2 spray per nostrils every 12 hrs as needed. Continue Singulair and Zyrtec as prescribed.   3. Hordeolum externum of left lower eyelid We instructed Christine Barry to use hot compress for 15 mins on affected eye every 4 hours to help with the discomfort and to help drain the stye. If no improvement in 24-48 hours of conservative measures, start erythromycin ointment TID to the left eye for 5 days.   4. Elevated cholesterol Checking lipids. Continue Simvastatin 20mg  daily.  - Lipid panel  5. High risk medication use Checking labs. If kidney function ok, continue meloxicam 15mg  daily.  - CBC with Differential/Platelet - COMPLETE METABOLIC PANEL WITH GFR  Return in about 3 months (around 10/20/2022) for chronic disease follow up.    , RN

## 2022-07-20 NOTE — Progress Notes (Signed)
Medical screening examination/treatment was performed by qualified nurse practitioner student and as supervising provider I was immediately available for consultation/collaboration. I have reviewed documentation and agree with assessment and plan. ° °Finesse Fielder L. Ed Rayson, DNP, APRN, FNP-BC °[facility] °Primary Care and Sports Medicine ° °

## 2022-07-21 LAB — COMPLETE METABOLIC PANEL WITH GFR
AG Ratio: 1.6 (calc) (ref 1.0–2.5)
ALT: 20 U/L (ref 6–29)
AST: 22 U/L (ref 10–35)
Albumin: 4.2 g/dL (ref 3.6–5.1)
Alkaline phosphatase (APISO): 60 U/L (ref 37–153)
BUN: 25 mg/dL (ref 7–25)
CO2: 30 mmol/L (ref 20–32)
Calcium: 9.4 mg/dL (ref 8.6–10.4)
Chloride: 101 mmol/L (ref 98–110)
Creat: 0.79 mg/dL (ref 0.60–1.00)
Globulin: 2.7 g/dL (calc) (ref 1.9–3.7)
Glucose, Bld: 86 mg/dL (ref 65–99)
Potassium: 4.5 mmol/L (ref 3.5–5.3)
Sodium: 138 mmol/L (ref 135–146)
Total Bilirubin: 0.3 mg/dL (ref 0.2–1.2)
Total Protein: 6.9 g/dL (ref 6.1–8.1)
eGFR: 76 mL/min/{1.73_m2} (ref >=60)

## 2022-07-21 LAB — CBC WITH DIFFERENTIAL/PLATELET
Absolute Monocytes: 838 cells/uL (ref 200–950)
Basophils Absolute: 40 cells/uL (ref 0–200)
Basophils Relative: 0.6 %
Eosinophils Absolute: 342 cells/uL (ref 15–500)
Eosinophils Relative: 5.1 %
HCT: 41.9 % (ref 35.0–45.0)
Hemoglobin: 14 g/dL (ref 11.7–15.5)
Lymphs Abs: 1554 cells/uL (ref 850–3900)
MCH: 29.6 pg (ref 27.0–33.0)
MCHC: 33.4 g/dL (ref 32.0–36.0)
MCV: 88.6 fL (ref 80.0–100.0)
MPV: 14.1 fL — ABNORMAL HIGH (ref 7.5–12.5)
Monocytes Relative: 12.5 %
Neutro Abs: 3926 cells/uL (ref 1500–7800)
Neutrophils Relative %: 58.6 %
Platelets: 163 10*3/uL (ref 140–400)
RBC: 4.73 10*6/uL (ref 3.80–5.10)
RDW: 12.8 % (ref 11.0–15.0)
Total Lymphocyte: 23.2 %
WBC: 6.7 10*3/uL (ref 3.8–10.8)

## 2022-07-21 LAB — LIPID PANEL
Cholesterol: 233 mg/dL — ABNORMAL HIGH (ref <200)
HDL: 82 mg/dL (ref >=50)
LDL Cholesterol (Calc): 123 mg/dL (calc) — ABNORMAL HIGH
Non-HDL Cholesterol (Calc): 151 mg/dL (calc) — ABNORMAL HIGH (ref <130)
Total CHOL/HDL Ratio: 2.8 (calc) (ref <5.0)
Triglycerides: 160 mg/dL — ABNORMAL HIGH (ref <150)

## 2022-07-23 ENCOUNTER — Encounter: Payer: Self-pay | Admitting: Medical-Surgical

## 2022-07-23 MED ORDER — SIMVASTATIN 40 MG PO TABS: 40.0000 mg | ORAL_TABLET | Freq: Every day | ORAL | 3 refills | Status: DC

## 2022-08-03 ENCOUNTER — Encounter: Payer: Self-pay | Admitting: Medical-Surgical

## 2022-08-09 ENCOUNTER — Encounter: Payer: Self-pay | Admitting: Medical-Surgical

## 2022-08-09 ENCOUNTER — Ambulatory Visit (INDEPENDENT_AMBULATORY_CARE_PROVIDER_SITE_OTHER): Payer: PPO | Admitting: Medical-Surgical

## 2022-08-09 VITALS — BP 117/66 | HR 89 | Resp 20 | Ht 66.0 in | Wt 169.9 lb

## 2022-08-09 DIAGNOSIS — Z23 Encounter for immunization: Secondary | ICD-10-CM

## 2022-08-09 DIAGNOSIS — Z634 Disappearance and death of family member: Secondary | ICD-10-CM

## 2022-08-09 DIAGNOSIS — H00015 Hordeolum externum left lower eyelid: Secondary | ICD-10-CM | POA: Diagnosis not present

## 2022-08-09 DIAGNOSIS — M2550 Pain in unspecified joint: Secondary | ICD-10-CM | POA: Diagnosis not present

## 2022-08-09 DIAGNOSIS — F4321 Adjustment disorder with depressed mood: Secondary | ICD-10-CM

## 2022-08-09 MED ORDER — DOXYCYCLINE HYCLATE 100 MG PO TABS: 100.0000 mg | ORAL_TABLET | Freq: Two times a day (BID) | ORAL | 0 refills | Status: AC

## 2022-08-09 NOTE — Progress Notes (Signed)
Established Patient Office Visit  Subjective   Patient ID: Christine Barry, female   DOB: Jan 08, 1941 Age: 81 y.o. MRN: [id number]   Chief Complaint  Patient presents with   Medication Refill   HPI Pleasant 81 year old female presenting today for the following:  Grief: was referred to counseling by Dr. Tamera Punt but when she received a call from the location, they informed her that only 1 provider excepted Medicare and they were not excepting patients until at least the spring if not longer.  Notes that she does not feel like she needs counseling at this time and that she is handling her son's death as well as could be expected.  Works to stay upbeat but is processing her grief appropriately.  Denies SI/HI.  Arthralgias: was taking Meloxicam daily, tolerating well without side effects. It was very helpful and helped to control the aches and pains.  At her last visit, we discussed using the meloxicam only as needed and making sure her kidney function was doing well after long-term therapy.  She stopped the medication and has been doing okay without it however she notes her symptoms were much better controlled with it.  If okay, she would like to restart the medication.  Reports that the stye on her left lower eyelid is still present.  It has not been quite as swollen as it was but it does still have some redness and some tenderness.  At one point, she was able to see a white patch on the inside of her eyelid but now she has a patch of what looks to be pus on the outside of her eyelid.  She did warm compresses as instructed and used erythromycin eye ointment for 5 days without significant improvement or resolution.  No fevers, chills, vision changes, or pain with eye movements.   Objective:    Vitals:   08/09/22 1456  BP: 117/66  Pulse: 89  Resp: 20  Height: 5\' 6"  (1.676 m)  Weight: 169 lb 14.4 oz (77.1 kg)  SpO2: 94%  BMI (Calculated): 27.44    Physical Exam Vitals and nursing note  reviewed.  Constitutional:      General: She is not in acute distress.    Appearance: Normal appearance. She is not ill-appearing.  HENT:     Head: Normocephalic and atraumatic.  Cardiovascular:     Rate and Rhythm: Normal rate and regular rhythm.     Pulses: Normal pulses.     Heart sounds: Normal heart sounds.  Pulmonary:     Effort: Pulmonary effort is normal. No respiratory distress.     Breath sounds: Normal breath sounds. No wheezing, rhonchi or rales.  Skin:    General: Skin is warm and dry.     Findings: Lesion (33mmx2mm yellowish lesion to the left lower eyelid) present.  Neurological:     Mental Status: She is alert and oriented to person, place, and time.  Psychiatric:        Mood and Affect: Mood normal.        Behavior: Behavior normal.        Thought Content: Thought content normal.        Judgment: Judgment normal.   No results found for this or any previous visit (from the past 24 hour(s)).     The 10-year ASCVD risk score (Arnett DK, et al., 2019) is: 21.8%   Values used to calculate the score:     Age: 82 years     Sex: Female  Is Non-Hispanic African American: No     Diabetic: No     Tobacco smoker: No     Systolic Blood Pressure: 147 mmHg     Is BP treated: No     HDL Cholesterol: 82 mg/dL     Total Cholesterol: 233 mg/dL   Assessment & Plan:   1. Hordeolum externum of left lower eyelid No resolution of the previous stye.  It does look as if it is pus filled at the lesion on her lower eyelid so we will go ahead and treat to cover for MRSA with doxycycline 100 mg twice daily. - doxycycline (VIBRA-TABS) 100 MG tablet; Take 1 tablet (100 mg total) by mouth 2 (two) times daily for 7 days.  Dispense: 14 tablet; Refill: 0  2. Need for COVID-19 vaccine Vaccine given in office today. [employer] Fall 2023 Covid-19 Vaccine 53yrs and older  3. Grief at loss of child Currently managing well and does not feel that she needs intervention.  She is aware to let  me know should she change her mind and desire counseling.  4. Polyarthralgia Kidney function looks good so go ahead and resume meloxicam 15 mg daily.  Return in about 6 months (around 02/07/2023) for chronic disease follow up.  ___________________________________________ Clearnce Sorrel, DNP, APRN, FNP-BC [facility] and [place]

## 2022-08-13 ENCOUNTER — Other Ambulatory Visit: Payer: Self-pay | Admitting: Medical-Surgical

## 2022-08-13 ENCOUNTER — Encounter: Payer: Self-pay | Admitting: Medical-Surgical

## 2022-08-13 DIAGNOSIS — Z1231 Encounter for screening mammogram for malignant neoplasm of breast: Secondary | ICD-10-CM

## 2022-08-16 ENCOUNTER — Ambulatory Visit (INDEPENDENT_AMBULATORY_CARE_PROVIDER_SITE_OTHER): Payer: PPO

## 2022-08-16 DIAGNOSIS — Z1231 Encounter for screening mammogram for malignant neoplasm of breast: Secondary | ICD-10-CM

## 2022-09-07 ENCOUNTER — Ambulatory Visit: Payer: PPO | Admitting: Medical-Surgical

## 2022-09-17 DIAGNOSIS — L720 Epidermal cyst: Secondary | ICD-10-CM | POA: Diagnosis not present

## 2022-09-17 DIAGNOSIS — L649 Androgenic alopecia, unspecified: Secondary | ICD-10-CM | POA: Diagnosis not present

## 2022-09-25 ENCOUNTER — Encounter: Payer: Self-pay | Admitting: Medical-Surgical

## 2022-11-01 DIAGNOSIS — L720 Epidermal cyst: Secondary | ICD-10-CM | POA: Diagnosis not present

## 2022-11-01 DIAGNOSIS — L821 Other seborrheic keratosis: Secondary | ICD-10-CM | POA: Diagnosis not present

## 2022-11-01 DIAGNOSIS — L649 Androgenic alopecia, unspecified: Secondary | ICD-10-CM | POA: Diagnosis not present

## 2022-11-17 ENCOUNTER — Other Ambulatory Visit: Payer: Self-pay | Admitting: Medical-Surgical

## 2022-11-17 DIAGNOSIS — J302 Other seasonal allergic rhinitis: Secondary | ICD-10-CM

## 2022-11-18 ENCOUNTER — Encounter: Payer: Self-pay | Admitting: Medical-Surgical

## 2023-02-07 ENCOUNTER — Ambulatory Visit (INDEPENDENT_AMBULATORY_CARE_PROVIDER_SITE_OTHER): Payer: PPO | Admitting: Medical-Surgical

## 2023-02-07 ENCOUNTER — Encounter: Payer: Self-pay | Admitting: Medical-Surgical

## 2023-02-07 VITALS — BP 123/70 | HR 79 | Resp 20 | Ht 66.0 in | Wt 170.2 lb

## 2023-02-07 DIAGNOSIS — M25551 Pain in right hip: Secondary | ICD-10-CM | POA: Diagnosis not present

## 2023-02-07 DIAGNOSIS — Z8639 Personal history of other endocrine, nutritional and metabolic disease: Secondary | ICD-10-CM

## 2023-02-07 DIAGNOSIS — R7309 Other abnormal glucose: Secondary | ICD-10-CM | POA: Diagnosis not present

## 2023-02-07 DIAGNOSIS — J302 Other seasonal allergic rhinitis: Secondary | ICD-10-CM

## 2023-02-07 DIAGNOSIS — Z8349 Family history of other endocrine, nutritional and metabolic diseases: Secondary | ICD-10-CM | POA: Diagnosis not present

## 2023-02-07 DIAGNOSIS — Z1211 Encounter for screening for malignant neoplasm of colon: Secondary | ICD-10-CM | POA: Diagnosis not present

## 2023-02-07 DIAGNOSIS — E890 Postprocedural hypothyroidism: Secondary | ICD-10-CM | POA: Diagnosis not present

## 2023-02-07 DIAGNOSIS — E785 Hyperlipidemia, unspecified: Secondary | ICD-10-CM

## 2023-02-07 LAB — CBC WITH DIFFERENTIAL/PLATELET
Absolute Monocytes: 770 cells/uL (ref 200–950)
Eosinophils Absolute: 350 cells/uL (ref 15–500)
Eosinophils Relative: 5 %
MCHC: 32.5 g/dL (ref 32.0–36.0)
Monocytes Relative: 11 %
Neutrophils Relative %: 60.4 %
RDW: 12.9 % (ref 11.0–15.0)
Total Lymphocyte: 22.6 %
WBC: 7 10*3/uL (ref 3.8–10.8)

## 2023-02-07 MED ORDER — MONTELUKAST SODIUM 10 MG PO TABS
10.0000 mg | ORAL_TABLET | Freq: Every day | ORAL | 0 refills | Status: DC
Start: 2023-02-07 — End: 2023-05-15

## 2023-02-07 MED ORDER — MELOXICAM 15 MG PO TABS
15.0000 mg | ORAL_TABLET | Freq: Every day | ORAL | 3 refills | Status: DC
Start: 2023-02-07 — End: 2023-08-27

## 2023-02-07 NOTE — Progress Notes (Signed)
        Established patient visit  History, exam, impression, and plan:  1. Colon cancer screening Pleasant 82 year old female with a history of colon polyps reporting that her last colonoscopy was in 2019 with a recommended repeat in 5 years.  She is currently asymptomatic but would like to get this updated.  She is in good health within expected life span of 10+ years so feel that repeating the colonoscopy is appropriate.  Referring to GI. - Ambulatory referral to Gastroenterology  2. Family history of vitamin B12 deficiency She does have a family history of vitamin B12 deficiency and several family members.  Has been taking oral vitamin B12 supplementation on a daily basis.  Plan to check vitamin B12 levels today. - Vitamin B12  3. History of iron deficiency Personal history of iron deficiency but has not been checked in a while.  She does take an oral iron supplement every day.  Checking CBC with differential and iron panel today. - Iron, TIBC and Ferritin Panel - CBC with Differential/Platelet  4. Seasonal allergic rhinitis, unspecified trigger History of seasonal allergies that are currently well-managed with Zyrtec 10 mg daily and Singulair 10 mg nightly.  Does have a little bit of breakthrough allergy symptoms but these are manageable for now.  Continue Zyrtec and Singulair as prescribed. - montelukast (SINGULAIR) 10 MG tablet; Take 1 tablet (10 mg total) by mouth at bedtime.  Dispense: 90 tablet; Refill: 0  5. Right hip pain Previously noted right hip pain suspected to be IT band related.  Takes meloxicam 15 mg daily which is extremely beneficial.  Tolerating the medication well without GI upset or other side effects.  No concerns with kidney function.  Okay to continue meloxicam 15 mg daily as needed. - meloxicam (MOBIC) 15 MG tablet; Take 1 tablet (15 mg total) by mouth daily.  Dispense: 90 tablet; Refill: 3  6. Postoperative hypothyroidism Last TSH checked with result of 0.33.   Adjustments to her Euthyrox dosing was made to skip 2 Sundays after the month.  Due for recheck today.  Denies concerning symptoms and feels well overall.  Checking TSH.  Continue Euthyrox as prescribed, titrate depending on results. - TSH - EUTHYROX 200 MCG tablet; TAKE 1 TABLET BY MOUTH ONCE DAILY BEFORE BREAKFAST EXCEPT  THE  2ND  AND  4TH  SUNDAY  OF  THE  MONTH  Dispense: 90 tablet; Refill: 1  7. Increased glucose level History of increased glucose level.  Checking hemoglobin A1c and CMP. - COMPLETE METABOLIC PANEL WITH GFR - Hemoglobin A1c  8. Hyperlipidemia, unspecified hyperlipidemia type History of hyperlipidemia currently managed with simvastatin 40 mg daily.  Following a low-fat heart healthy diet.  Activity as tolerated.  Checking labs as below.  Continue simvastatin as prescribed. - COMPLETE METABOLIC PANEL WITH GFR - Lipid panel  Procedures performed this visit: None.    Return in about 6 months (around 08/10/2023) for chronic disease follow up.  __________________________________ Thayer Ohm, DNP, APRN, FNP-BC Primary Care and [facility] [place]

## 2023-02-08 LAB — CBC WITH DIFFERENTIAL/PLATELET
Basophils Absolute: 70 cells/uL (ref 0–200)
Basophils Relative: 1 %
HCT: 42.2 % (ref 35.0–45.0)
Hemoglobin: 13.7 g/dL (ref 11.7–15.5)
Lymphs Abs: 1582 cells/uL (ref 850–3900)
MCH: 29.9 pg (ref 27.0–33.0)
MCV: 92.1 fL (ref 80.0–100.0)
MPV: 14.1 fL — ABNORMAL HIGH (ref 7.5–12.5)
Neutro Abs: 4228 cells/uL (ref 1500–7800)
Platelets: 173 10*3/uL (ref 140–400)
RBC: 4.58 10*6/uL (ref 3.80–5.10)

## 2023-02-08 LAB — TSH: TSH: 0.59 mIU/L (ref 0.40–4.50)

## 2023-02-08 LAB — LIPID PANEL
Cholesterol: 192 mg/dL (ref <200)
HDL: 70 mg/dL (ref >=50)
LDL Cholesterol (Calc): 93 mg/dL (calc)
Non-HDL Cholesterol (Calc): 122 mg/dL (calc) (ref <130)
Total CHOL/HDL Ratio: 2.7 (calc) (ref <5.0)
Triglycerides: 202 mg/dL — ABNORMAL HIGH (ref <150)

## 2023-02-08 LAB — COMPLETE METABOLIC PANEL WITH GFR
AG Ratio: 1.6 (calc) (ref 1.0–2.5)
ALT: 22 U/L (ref 6–29)
AST: 22 U/L (ref 10–35)
Albumin: 4.1 g/dL (ref 3.6–5.1)
Alkaline phosphatase (APISO): 58 U/L (ref 37–153)
BUN: 25 mg/dL (ref 7–25)
CO2: 27 mmol/L (ref 20–32)
Calcium: 9.1 mg/dL (ref 8.6–10.4)
Chloride: 103 mmol/L (ref 98–110)
Creat: 0.93 mg/dL (ref 0.60–0.95)
Globulin: 2.6 g/dL (calc) (ref 1.9–3.7)
Glucose, Bld: 116 mg/dL — ABNORMAL HIGH (ref 65–99)
Potassium: 4.5 mmol/L (ref 3.5–5.3)
Sodium: 140 mmol/L (ref 135–146)
Total Bilirubin: 0.3 mg/dL (ref 0.2–1.2)
Total Protein: 6.7 g/dL (ref 6.1–8.1)
eGFR: 62 mL/min/{1.73_m2} (ref >=60)

## 2023-02-08 LAB — IRON,TIBC AND FERRITIN PANEL
%SAT: 19 % (calc) (ref 16–45)
Ferritin: 240 ng/mL (ref 16–288)
Iron: 56 ug/dL (ref 45–160)
TIBC: 302 mcg/dL (calc) (ref 250–450)

## 2023-02-08 LAB — VITAMIN B12: Vitamin B-12: 839 pg/mL (ref 200–1100)

## 2023-02-08 LAB — HEMOGLOBIN A1C
Hgb A1c MFr Bld: 6 % of total Hgb — ABNORMAL HIGH (ref <5.7)
Mean Plasma Glucose: 126 mg/dL
eAG (mmol/L): 7 mmol/L

## 2023-02-08 MED ORDER — EUTHYROX 200 MCG PO TABS: ORAL_TABLET | ORAL | 1 refills | Status: DC

## 2023-02-19 ENCOUNTER — Telehealth: Payer: Self-pay | Admitting: Medical-Surgical

## 2023-02-19 NOTE — Telephone Encounter (Signed)
Contacted Wind[facility] Hummingbird to schedule their annual wellness visit. Appointment made for 04/09/2023.  Moody Bruins*

## 2023-04-09 ENCOUNTER — Ambulatory Visit (INDEPENDENT_AMBULATORY_CARE_PROVIDER_SITE_OTHER): Payer: PPO | Admitting: Medical-Surgical

## 2023-04-09 VITALS — BP 148/80 | HR 67 | Wt 168.1 lb

## 2023-04-09 DIAGNOSIS — Z0000 Encounter for general adult medical examination without abnormal findings: Secondary | ICD-10-CM

## 2023-04-09 NOTE — Patient Instructions (Addendum)
MEDICARE ANNUAL WELLNESS VISIT Health Maintenance Summary and Written Plan of Care  Christine Barry ,  Thank you for allowing me to perform your Medicare Annual Wellness Visit and for your ongoing commitment to your health.   Health Maintenance & Immunization History Health Maintenance  Topic Date Due   COVID-19 Vaccine (7 - 2023-24 season) 04/25/2023 (Originally 10/04/2022)   INFLUENZA VACCINE  05/09/2023   Medicare Annual Wellness (AWV)  04/08/2024   DTaP/Tdap/Td (2 - Td or Tdap) 03/01/2028   DEXA SCAN  12/08/2028   Pneumonia Vaccine 44+ Years old  Completed   Zoster Vaccines- Shingrix  Completed   HPV VACCINES  Aged Out   Immunization History  Administered Date(s) Administered   COVID-19, mRNA, vaccine(Comirnaty)12 years and older 08/09/2022   Influenza Split 08/02/2010, 08/09/2016, 07/15/2017, 07/18/2018   Influenza, High Dose Seasonal PF 07/07/2015, 08/10/2016, 07/23/2019   Influenza, Quadrivalent, Recombinant, Inj, Pf 07/18/2018   Influenza-Unspecified 06/26/2011, 07/21/2020, 07/12/2022   PFIZER(Purple Top)SARS-COV-2 Vaccination 11/02/2019, 11/23/2019, 07/05/2020, 07/01/2021, 05/08/2022   Pneumococcal Conjugate-13 10/08/2005, 06/21/2014, 07/18/2018, 07/22/2018   Pneumococcal Polysaccharide-23 06/04/2019   Pneumococcal-Unspecified 10/08/2005   Tdap 03/01/2018   Zoster Recombinant(Shingrix) 12/16/2017, 05/05/2018   Zoster, Live 08/02/2011    These are the patient goals that we discussed:  Goals Addressed               This Visit's Progress     Patient Stated (pt-stated)        Patient stated that she would like to maintain her staying active and being mobile.         This is a list of Health Maintenance Items that are overdue or due now: Colorectal cancer screening - waiting to have a consultation with GI doctor.     Orders/Referrals Placed Today: No orders of the defined types were placed in this encounter.  (Contact our referral department at  [phone] if you have not spoken with someone about your referral appointment within the next 5 days)    Follow-up Plan Nurse Notes: Patient advised to stop Afrin as it can cause rebound symptoms. You can use Azelastine or dymista over the counter.  Follow-up with Christen Butter, NP as planned Medicare wellness visit in one year.  AVS printed and given to the patient.      Health Maintenance, Female Adopting a healthy lifestyle and getting preventive care are important in promoting health and wellness. Ask your health care provider about: The right schedule for you to have regular tests and exams. Things you can do on your own to prevent diseases and keep yourself healthy. What should I know about diet, weight, and exercise? Eat a healthy diet  Eat a diet that includes plenty of vegetables, fruits, low-fat dairy products, and lean protein. Do not eat a lot of foods that are high in solid fats, added sugars, or sodium. Maintain a healthy weight Body mass index (BMI) is used to identify weight problems. It estimates body fat based on height and weight. Your health care provider can help determine your BMI and help you achieve or maintain a healthy weight. Get regular exercise Get regular exercise. This is one of the most important things you can do for your health. Most adults should: Exercise for at least 150 minutes each week. The exercise should increase your heart rate and make you sweat (moderate-intensity exercise). Do strengthening exercises at least twice a week. This is in addition to the moderate-intensity exercise. Spend less time sitting. Even light physical activity can be beneficial.  Watch cholesterol and blood lipids Have your blood tested for lipids and cholesterol at 80 years of age, then have this test every 5 years. Have your cholesterol levels checked more often if: Your lipid or cholesterol levels are high. You are older than 80 years of age. You are at high risk  for heart disease. What should I know about cancer screening? Depending on your health history and family history, you may need to have cancer screening at various ages. This may include screening for: Breast cancer. Cervical cancer. Colorectal cancer. Skin cancer. Lung cancer. What should I know about heart disease, diabetes, and high blood pressure? Blood pressure and heart disease High blood pressure causes heart disease and increases the risk of stroke. This is more likely to develop in people who have high blood pressure readings or are overweight. Have your blood pressure checked: Every 3-5 years if you are 63-9 years of age. Every year if you are 72 years old or older. Diabetes Have regular diabetes screenings. This checks your fasting blood sugar level. Have the screening done: Once every three years after age 10 if you are at a normal weight and have a low risk for diabetes. More often and at a younger age if you are overweight or have a high risk for diabetes. What should I know about preventing infection? Hepatitis B If you have a higher risk for hepatitis B, you should be screened for this virus. Talk with your health care provider to find out if you are at risk for hepatitis B infection. Hepatitis C Testing is recommended for: Everyone born from [birth year] through 1963. Anyone with known risk factors for hepatitis C. Sexually transmitted infections (STIs) Get screened for STIs, including gonorrhea and chlamydia, if: You are sexually active and are younger than 80 years of age. You are older than 80 years of age and your health care provider tells you that you are at risk for this type of infection. Your sexual activity has changed since you were last screened, and you are at increased risk for chlamydia or gonorrhea. Ask your health care provider if you are at risk. Ask your health care provider about whether you are at high risk for HIV. Your health care provider may recommend  a prescription medicine to help prevent HIV infection. If you choose to take medicine to prevent HIV, you should first get tested for HIV. You should then be tested every 3 months for as long as you are taking the medicine. Pregnancy If you are about to stop having your period (premenopausal) and you may become pregnant, seek counseling before you get pregnant. Take 400 to 800 micrograms (mcg) of folic acid every day if you become pregnant. Ask for birth control (contraception) if you want to prevent pregnancy. Osteoporosis and menopause Osteoporosis is a disease in which the bones lose minerals and strength with aging. This can result in bone fractures. If you are 25 years old or older, or if you are at risk for osteoporosis and fractures, ask your health care provider if you should: Be screened for bone loss. Take a calcium or vitamin D supplement to lower your risk of fractures. Be given hormone replacement therapy (HRT) to treat symptoms of menopause. Follow these instructions at home: Alcohol use Do not drink alcohol if: Your health care provider tells you not to drink. You are pregnant, may be pregnant, or are planning to become pregnant. If you drink alcohol: Limit how much you have to: 0-1 drink a  day. Know how much alcohol is in your drink. In the [place] one drink equals one 12 oz bottle of beer (355 mL), one 5 oz glass of wine (148 mL), or one 1 oz glass of hard liquor (44 mL). Lifestyle Do not use any products that contain nicotine or tobacco. These products include cigarettes, chewing tobacco, and vaping devices, such as e-cigarettes. If you need help quitting, ask your health care provider. Do not use street drugs. Do not share needles. Ask your health care provider for help if you need support or information about quitting drugs. General instructions Schedule regular health, dental, and eye exams. Stay current with your vaccines. Tell your health care provider if: You often  feel depressed. You have ever been abused or do not feel safe at home. Summary Adopting a healthy lifestyle and getting preventive care are important in promoting health and wellness. Follow your health care provider's instructions about healthy diet, exercising, and getting tested or screened for diseases. Follow your health care provider's instructions on monitoring your cholesterol and blood pressure. This information is not intended to replace advice given to you by your health care provider. Make sure you discuss any questions you have with your health care provider. Document Revised: 02/13/2021 Document Reviewed: 02/13/2021 Elsevier Patient Education  2024 [employer].

## 2023-04-09 NOTE — Progress Notes (Signed)
MEDICARE ANNUAL WELLNESS VISIT  04/09/2023  Subjective:  Christine Barry is a 82 y.o. female patient of Christine Butter, NP who had a Medicare Annual Wellness Visit today. Christine Barry is Retired and lives alone. she had 2 children but one has deceased. she reports that she is socially active and does interact with friends/family regularly. she is moderately physically active and enjoys yoga.  Patient Care Team: Christine Butter, NP as PCP - General (Nurse Practitioner)     04/09/2023    1:11 PM 12/20/2021   11:39 AM 07/05/2021    5:07 PM 03/08/2021    1:32 PM  Advanced Directives  Does Patient Have a Medical Advance Directive? Yes Yes Yes Yes  Type of Advance Directive Living will  Healthcare Power of Attorney Living will  Does patient want to make changes to medical advance directive? No - Patient declined   No - Patient declined  Copy of Healthcare Power of Attorney in Chart?   No - copy requested     Hospital Utilization Over the Past 12 Months: # of hospitalizations or ER visits: 0 # of surgeries: 0  Review of Systems    Patient reports that her overall health is better when compared to last year.  Review of Systems: History obtained from chart review and the patient  All other systems negative.  Pain Assessment Pain : No/denies pain     Current Medications & Allergies (verified) Allergies as of 04/09/2023       Reactions   Penicillins Other (See Comments)   Not sure (occurred in the 1970's), but thinks she "passed out"   Sulfa Antibiotics         Medication List        Accurate as of April 09, 2023  1:54 PM. If you have any questions, ask your nurse or doctor.          albuterol 108 (90 Base) MCG/ACT inhaler Commonly known as: VENTOLIN HFA Inhale 2 puffs into the lungs every 6 (six) hours as needed for wheezing or shortness of breath.   ALPRAZolam 0.25 MG tablet Commonly known as: XANAX Take 0.25 mg by mouth at bedtime as needed for anxiety.   cetirizine 10 MG  tablet Commonly known as: ZYRTEC Take 10 mg by mouth daily as needed.   Cholecalciferol 50 MCG (2000 UT) Tabs Take 2,000 Units by mouth daily.   cyanocobalamin 1000 MCG tablet Take 1,000 mcg by mouth 3 (three) times a week.   Euthyrox 200 MCG tablet Generic drug: levothyroxine TAKE 1 TABLET BY MOUTH ONCE DAILY BEFORE BREAKFAST EXCEPT  THE  2ND  AND  4TH  SUNDAY  OF  THE  MONTH   finasteride 5 MG tablet Commonly known as: PROSCAR Take 5 mg by mouth daily.   Fluticasone-Umeclidin-Vilant 100-62.5-25 MCG/ACT Aepb Commonly known as: Trelegy Ellipta Inhale 1 puff into the lungs daily.   IRON PO Take by mouth. Nature made   meloxicam 15 MG tablet Commonly known as: MOBIC Take 1 tablet (15 mg total) by mouth daily.   montelukast 10 MG tablet Commonly known as: SINGULAIR Take 1 tablet (10 mg total) by mouth at bedtime.   oxymetazoline 0.05 % nasal spray Commonly known as: AFRIN Place 1 spray into both nostrils 2 (two) times daily.   simvastatin 40 MG tablet Commonly known as: ZOCOR Take 1 tablet (40 mg total) by mouth at bedtime.   UNABLE TO FIND Med Name: [employer] - eye drops 1 drop in each eye twice a day.  History (reviewed): Past Medical History:  Diagnosis Date   Allergy 1964   Anxiety 1986   Arthritis    Asthma    COPD (chronic obstructive pulmonary disease) (HCC)    Emphysema of lung (HCC) 2016   Hyperlipidemia    Thyroid disease    Past Surgical History:  Procedure Laterality Date   CESAREAN SECTION     EYE SURGERY  2018   Cataracts   Family History  Problem Relation Age of Onset   Lung disease Father    Early death Father    Anxiety disorder Mother    Arthritis Mother    Anxiety disorder Son    Arthritis Sister    Arthritis Sister    Social History   Socioeconomic History   Marital status: Widowed    Spouse name: Not on file   Number of children: 2   Years of education: Bachelor's degree   Highest education level: Bachelor's  degree (e.g., BA, AB, BS)  Occupational History   Occupation: Retired  Tobacco Use   Smoking status: Former    Packs/day: 2.00    Years: 38.00    Additional pack years: 0.00    Total pack years: 76.00    Types: Cigarettes    Start date: 10/08/1961    Quit date: 07/07/2000    Years since quitting: 22.7   Smokeless tobacco: Never  Vaping Use   Vaping Use: Never used  Substance and Sexual Activity   Alcohol use: Yes    Comment: occasional   Drug use: Never   Sexual activity: Not Currently    Birth control/protection: Abstinence, Post-menopausal  Other Topics Concern   Not on file  Social History Narrative   Lives alone. Her daughter lives in [place]. She has two grandchildren. She enjoys doing yoga. Her son committed suicide in 2023.   Social Determinants of Health   Financial Resource Strain: Low Risk  (04/05/2023)   Overall Financial Resource Strain (CARDIA)    Difficulty of Paying Living Expenses: Not hard at all  Food Insecurity: No Food Insecurity (04/05/2023)   Hunger Vital Sign    Worried About Running Out of Food in the Last Year: Never true    Ran Out of Food in the Last Year: Never true  Transportation Needs: No Transportation Needs (04/05/2023)   PRAPARE - Administrator, Civil Service (Medical): No    Lack of Transportation (Non-Medical): No  Physical Activity: Insufficiently Active (04/05/2023)   Exercise Vital Sign    Days of Exercise per Week: 2 days    Minutes of Exercise per Session: 10 min  Stress: No Stress Concern Present (04/05/2023)   [employer] of Occupational Health - Occupational Stress Questionnaire    Feeling of Stress : Not at all  Social Connections: Moderately Integrated (04/09/2023)   Social Connection and Isolation Panel [NHANES]    Frequency of Communication with Friends and Family: More than three times a week    Frequency of Social Gatherings with Friends and Family: Once a week    Attends Religious Services: More than 4  times per year    Active Member of [employer] or Organizations: Yes    Attends Banker Meetings: More than 4 times per year    Marital Status: Widowed    Activities of Daily Living    04/05/2023    8:51 AM  In your present state of health, do you have any difficulty performing the following activities:  Hearing? 0  Vision? 0  Difficulty concentrating or making decisions? 0  Walking or climbing stairs? 0  Dressing or bathing? 0  Doing errands, shopping? 0  Preparing Food and eating ? N  Using the Toilet? N  In the past six months, have you accidently leaked urine? Y  Do you have problems with loss of bowel control? N  Managing your Medications? N  Managing your Finances? N  Housekeeping or managing your Housekeeping? N    Patient Education/Literacy How often do you need to have someone help you when you read instructions, pamphlets, or other written materials from your doctor or pharmacy?: 1 - Never What is the last grade level you completed in school?: Bachelor's degree  Exercise    Diet Patient reports consuming 3 meals a day and 3 snack(s) a day Patient reports that her primary diet is: Regular Patient reports that she does have regular access to food.   Depression Screen    04/09/2023    1:11 PM 02/07/2023    2:05 PM 08/09/2022    3:20 PM 06/08/2022   10:56 AM 01/04/2022    1:28 PM 12/06/2021    1:50 PM 07/05/2021    5:03 PM  PHQ 2/9 Scores  PHQ - 2 Score 1 0 0 1 1 2  0  PHQ- 9 Score     1 5      Fall Risk    04/09/2023    1:11 PM 04/05/2023    8:51 AM 02/07/2023    2:05 PM 07/20/2022    1:23 PM 06/08/2022   10:55 AM  Fall Risk   Falls in the past year? 0 0 0 0 1  Number falls in past yr: 0 0 0 0 1  Injury with Fall? 0 0 0 0 0  Risk for fall due to : No Fall Risks  No Fall Risks No Fall Risks History of fall(s)  Follow up Falls evaluation completed  Falls evaluation completed Falls evaluation completed Education provided;Falls evaluation completed      Objective:   BP (!) 148/80 (BP Location: Left Arm, Patient Position: Sitting, Cuff Size: Normal)   Pulse 67   Wt 168 lb 1.3 oz (76.2 kg)   SpO2 96%   BMI 27.13 kg/m   Last Weight  Most recent update: 04/09/2023  1:04 PM    Weight  76.2 kg (168 lb 1.3 oz)             Body mass index is 27.13 kg/m.  Hearing/Vision  Katilaya did not have difficulty with hearing/understanding during the face-to-face interview Rusti did not have difficulty with her vision during the face-to-face interview Reports that she has had a formal eye exam by an eye care professional within the past year Reports that she has not had a formal hearing evaluation within the past year  Cognitive Function:    04/09/2023    1:15 PM 07/05/2021    5:13 PM  6CIT Screen  What Year? 0 points 0 points  What month? 0 points 0 points  What time? 0 points 0 points  Count back from 20 0 points 0 points  Months in reverse 0 points 0 points  Repeat phrase 2 points 0 points  Total Score 2 points 0 points    Normal Cognitive Function Screening: Yes (Normal:0-7, Significant for Dysfunction: >8)  Immunization & Health Maintenance Record Immunization History  Administered Date(s) Administered   COVID-19, mRNA, vaccine(Comirnaty)12 years and older 08/09/2022   Influenza Split 08/02/2010, 08/09/2016, 07/15/2017, 07/18/2018   Influenza, High  Dose Seasonal PF 07/07/2015, 08/10/2016, 07/23/2019   Influenza, Quadrivalent, Recombinant, Inj, Pf 07/18/2018   Influenza-Unspecified 06/26/2011, 07/21/2020, 07/12/2022   PFIZER(Purple Top)SARS-COV-2 Vaccination 11/02/2019, 11/23/2019, 07/05/2020, 07/01/2021, 05/08/2022   Pneumococcal Conjugate-13 10/08/2005, 06/21/2014, 07/18/2018, 07/22/2018   Pneumococcal Polysaccharide-23 06/04/2019   Pneumococcal-Unspecified 10/08/2005   Tdap 03/01/2018   Zoster Recombinant(Shingrix) 12/16/2017, 05/05/2018   Zoster, Live 08/02/2011    Health Maintenance  Topic Date Due   COVID-19 Vaccine (7  - 2023-24 season) 04/25/2023 (Originally 10/04/2022)   INFLUENZA VACCINE  05/09/2023   Medicare Annual Wellness (AWV)  04/08/2024   DTaP/Tdap/Td (2 - Td or Tdap) 03/01/2028   DEXA SCAN  12/08/2028   Pneumonia Vaccine 25+ Years old  Completed   Zoster Vaccines- Shingrix  Completed   HPV VACCINES  Aged Out       Assessment  This is a routine wellness examination for [employer].  Health Maintenance: Due or Overdue There are no preventive care reminders to display for this patient.   Windell Hummingbird does not need a referral for Community Assistance: Care Management:   no Social Work:    no Prescription Assistance:  no Nutrition/Diabetes Education:  no   Plan:  Personalized Goals  Goals Addressed               This Visit's Progress     Patient Stated (pt-stated)        Patient stated that she would like to maintain her staying active and being mobile.       Personalized Health Maintenance & Screening Recommendations  Colorectal cancer screening - waiting to have a consultation with GI doctor.   Lung Cancer Screening Recommended: no (Low Dose CT Chest recommended if Age 16-80 years, 20 pack-year currently smoking OR have quit w/in past 15 years) Hepatitis C Screening recommended: no HIV Screening recommended: no  Advanced Directives: Written information was not given per the patient's request.  Referrals & Orders No orders of the defined types were placed in this encounter.  Nurse Notes: Patient advised to stop Afrin as it can cause rebound symptoms. You can use Azelastine or dymista over the counter.   Follow-up Plan Follow-up with Christine Butter, NP as planned Medicare wellness visit in one year.  AVS printed and given to the patient.   I have personally reviewed and noted the following in the patient's chart:   Medical and social history Use of alcohol, tobacco or illicit drugs  Current medications and supplements Functional ability and  status Nutritional status Physical activity Advanced directives List of other physicians Hospitalizations, surgeries, and ER visits in previous 12 months Vitals Screenings to include cognitive, depression, and falls Referrals and appointments  In addition, I have reviewed and discussed with patient certain preventive protocols, quality metrics, and best practice recommendations. A written personalized care plan for preventive services as well as general preventive health recommendations were provided to patient.     Modesto Charon, RN BSN  04/09/2023

## 2023-05-12 ENCOUNTER — Other Ambulatory Visit: Payer: Self-pay | Admitting: Medical-Surgical

## 2023-05-12 DIAGNOSIS — J302 Other seasonal allergic rhinitis: Secondary | ICD-10-CM

## 2023-05-14 ENCOUNTER — Ambulatory Visit: Payer: PPO

## 2023-05-14 ENCOUNTER — Encounter: Payer: Self-pay | Admitting: Medical-Surgical

## 2023-05-14 ENCOUNTER — Ambulatory Visit (INDEPENDENT_AMBULATORY_CARE_PROVIDER_SITE_OTHER): Payer: PPO | Admitting: Medical-Surgical

## 2023-05-14 VITALS — BP 125/70 | HR 86 | Resp 20 | Ht 66.0 in | Wt 168.1 lb

## 2023-05-14 DIAGNOSIS — M5412 Radiculopathy, cervical region: Secondary | ICD-10-CM

## 2023-05-14 DIAGNOSIS — M4802 Spinal stenosis, cervical region: Secondary | ICD-10-CM | POA: Diagnosis not present

## 2023-05-14 DIAGNOSIS — M47812 Spondylosis without myelopathy or radiculopathy, cervical region: Secondary | ICD-10-CM | POA: Diagnosis not present

## 2023-05-14 DIAGNOSIS — M4312 Spondylolisthesis, cervical region: Secondary | ICD-10-CM | POA: Diagnosis not present

## 2023-05-14 DIAGNOSIS — M542 Cervicalgia: Secondary | ICD-10-CM | POA: Diagnosis not present

## 2023-05-14 MED ORDER — PREDNISONE 50 MG PO TABS
50.0000 mg | ORAL_TABLET | Freq: Every day | ORAL | 0 refills | Status: DC
Start: 2023-05-14 — End: 2023-07-14

## 2023-05-14 MED ORDER — CYCLOBENZAPRINE HCL 10 MG PO TABS
5.0000 mg | ORAL_TABLET | Freq: Three times a day (TID) | ORAL | 1 refills | Status: DC | PRN
Start: 2023-05-14 — End: 2024-04-21

## 2023-05-14 NOTE — Progress Notes (Signed)
        Established patient visit  History, exam, impression, and plan:  1. Cervical radiculopathy Pleasant 82 year old female presenting today with reports of left-sided neck shoulder and upper arm pain that started approximately 2 weeks ago.  She went on vacation to [place] to get her sister and then they were going to go to [place].  She remembers picking up a suitcase that was very heavy with her left arm and is not sure if there was an injury that she did not note until later.  Reports that she has shoulder and neck pain that is constant and throbbing however with certain movements, the pain becomes sharp and stabbing.  She has not tried any over-the-counter medications or home remedies for this as she just returned from her vacation yesterday.  No weakness, numbness, or tingling in the arms.  Upper extremities neurovascularly intact.  Tenderness along the left paraspinal cervical muscles extending down into the trapezius.  Notable muscle spasm in the area of concern.  Pain also reproducible on palpation of the inner upper left arm.  Symptoms consistent with cervical radiculopathy.  Getting cervical spine x-rays today.  Adding prednisone 50 mg daily x 5 days.  Hold meloxicam until prednisone is complete then resume 15 mg daily.  Flexeril 5-10 mg 3 times daily as needed.  I did warn that this may cause sedation so to make sure to avoid driving and other activities until she knows how she will respond to the medication.  Referring for physical therapy. - predniSONE (DELTASONE) 50 MG tablet; Take 1 tablet (50 mg total) by mouth daily.  Dispense: 5 tablet; Refill: 0 - cyclobenzaprine (FLEXERIL) 10 MG tablet; Take 0.5-1 tablets (5-10 mg total) by mouth 3 (three) times daily as needed for muscle spasms. Caution: can cause drowsiness  Dispense: 60 tablet; Refill: 1 - DG Cervical Spine Complete; Future - Ambulatory referral to Physical Therapy  Procedures performed this visit: None.  Return in about  6 weeks (around 06/25/2023) for neck/shoulder/arm pain if no improvement.  __________________________________ Thayer Ohm, DNP, APRN, FNP-BC Primary Care and Sports Medicine [facility] [place]

## 2023-05-21 ENCOUNTER — Encounter: Payer: Self-pay | Admitting: Medical-Surgical

## 2023-05-21 NOTE — Therapy (Unsigned)
OUTPATIENT PHYSICAL THERAPY CERVICAL EVALUATION   Patient Name: Christine Barry MRN: [id number] DOB:1941/05/05, 82 y.o., female Today's Date: 05/22/2023  END OF SESSION:  PT End of Session - 05/22/23 1633     Visit Number 1    Number of Visits 24    Date for PT Re-Evaluation 08/14/23    Authorization Type [employer]    Progress Note Due on Visit 10    PT Start Time 1145    PT Stop Time 1236    PT Time Calculation (min) 51 min    Activity Tolerance Patient tolerated treatment well             Past Medical History:  Diagnosis Date   Allergy 1964   Anxiety 1986   Arthritis    Asthma    COPD (chronic obstructive pulmonary disease) (HCC)    Emphysema of lung (HCC) 2016   Hyperlipidemia    Thyroid disease    Past Surgical History:  Procedure Laterality Date   CESAREAN SECTION     EYE SURGERY  2018   Cataracts   Patient Active Problem List   Diagnosis Date Noted   Weight gain 06/08/2022   Grief at loss of child 06/08/2022   Postoperative hypothyroidism 03/08/2021   Centrilobular emphysema (HCC) 03/08/2021   Moderate persistent asthma without complication 03/08/2021   Osteoporosis screening 03/08/2021   Right hip pain 08/23/2020   Anxiety 07/23/2019   Diverticulosis 07/23/2019   Elevated cholesterol 07/23/2019   History of nicotine dependence 07/23/2019   Hyperplastic colon polyp 07/23/2019   Increased glucose level 07/23/2019   Osteoarthritis 07/23/2019   Redundant colon 07/23/2019   Seasonal allergic rhinitis 07/23/2019   Sensorineural hearing loss (SNHL) of both ears 07/23/2019   Vitamin D deficiency 07/23/2019   Xerosis of skin 07/23/2019   Chronic obstructive pulmonary disease (HCC) 07/23/2019   Pseudoptosis 09/16/2013   Dermatochalasis 09/16/2013    PCP: Christen Butter, NP  REFERRING PROVIDER: Christen Butter, NP  REFERRING DIAG: cervicalgia  THERAPY DIAG:  Cervical radiculopathy  Acute pain of left shoulder  Muscle weakness  (generalized)  Other symptoms and signs involving the musculoskeletal system  Rationale for Evaluation and Treatment: Rehabilitation  ONSET DATE: 04/28/23  SUBJECTIVE:                                                                                                                                                                                                         SUBJECTIVE STATEMENT: Patient reports that she is having pain in the L shoulder and arm to elbow area for  the past ~3 weeks ago which she feels may be related to heavy lifting in the yard and with luggage when on vacation. Symptoms have gradually increased but is not no longer changing. She took 5 day course of prednisone with no change in symptoms.    Hand dominance: Right  PERTINENT HISTORY:  R hip pain; arthritis   PAIN:  Are you having pain? Yes: NPRS scale: 0/10 at rest 6/10 with lifting arm  Pain location: L shoulder to middle of the L arm  Pain description: sharp Aggravating factors: lifting arm; reaching down;  Relieving factors: not moving arm; no change with prednisone   PRECAUTIONS: None  RED FLAGS: None     WEIGHT BEARING RESTRICTIONS: No  FALLS:  Has patient fallen in last 6 months? 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 She does some household chores and cooking; yard work; yoga 1x/wk at [employer]; church; reading; on line games   PLOF: Independent  PATIENT GOALS: get rid of the L arm and use the arm normally   NEXT MD VISIT: 08/12/23  OBJECTIVE:   DIAGNOSTIC FINDINGS:  Cervical xray 05/20/23:  Severe multilevel spondylosis and facet hypertrophy, most pronounced at the C5-6 level as above. No acute displaced fracture  PATIENT SURVEYS:  FOTO 47; goal 61  COGNITION: Overall cognitive status: Within functional  limits for tasks assessed  SENSATION: WFL  POSTURE: Patient presents with head forward posture with increased thoracic kyphosis; shoulders rounded and elevated; scapulae abducted and rotated along the thoracic spine; head of the humerus anterior in orientation.   PALPATION: Pain with palpation L shoulder anterior shoulder at biceps tendon area; posterior shoulder at infraspinatus/teres area    CERVICAL ROM:   Active ROM A/PROM (deg) eval  Flexion 47  Extension 46  Right lateral flexion 28  Left lateral flexion 32  Right rotation 62  Left rotation 59   (Blank rows = not tested)  UPPER EXTREMITY ROM:  Active ROM Right eval Left eval  Shoulder flexion 147 66 pain L arm   Shoulder extension 67 pull 55 tight  Shoulder abduction 158 56 pain L arm   Shoulder adduction    Shoulder extension 57 arm at side  55 at side pull   Shoulder internal rotation Thumb T7 Thumb T10 pull L arm   Shoulder external rotation    Elbow flexion    Elbow extension    Wrist flexion    Wrist extension    Wrist ulnar deviation    Wrist radial deviation    Wrist pronation    Wrist supination     (Blank rows = not tested)  UPPER EXTREMITY MMT:  MMT Right eval Left eval  Shoulder flexion 5 3- pain  Shoulder extension 5 4 pain  Shoulder abduction 5 3- pain  Shoulder adduction    Shoulder extension 5 4  Shoulder internal rotation 5 4 pain  Shoulder external rotation 5 4+  Middle trapezius 4 3  Lower trapezius 4 3  Elbow flexion    Elbow extension    Wrist flexion    Wrist extension    Wrist ulnar deviation    Wrist radial deviation    Wrist pronation    Wrist supination    Grip strength     (Blank rows = not tested)  CERVICAL SPECIAL TESTS:  Spurling's test: Negative, and Distraction  test: Negative [facility] Adult PT Treatment:                                                DATE: 05/22/23 Therapeutic Exercise: Supine  Chin tuck 5 sec x 5 Chest lift 5 sec x 5  AA -> PROM L shoulder 10  sec x 2  Sitting  Biceps stretch 20 sec x 2  Manual Therapy: STM L anterior shoulder  Neuromuscular re-ed: Initiated postural correction  Modalities: Iontophoresis 1 cc dose 8 hour patch  Self Care: Avoid lifting with L UE    PATIENT EDUCATION:  Education details: poC; HEP Person educated: Patient Education method: Explanation, Demonstration, Tactile cues, Verbal cues, and Handouts Education comprehension: verbalized understanding, returned demonstration, verbal cues required, tactile cues required, and needs further education  HOME EXERCISE PROGRAM: Access Code: [email or web address] URL: [email or web address] Date: 05/22/2023 Prepared by: Corlis Leak  Exercises - Supine Cervical Retraction with Towel  - 2 x daily - 7 x weekly - 1 sets - 5-10 reps - 10 sec  hold - Supine Scapular Retraction  - 2 x daily - 7 x weekly - 1 sets - 10 reps - 5-10 sec  hold - Supine Shoulder Flexion AAROM with Hands Clasped  - 2 x daily - 7 x weekly - 1 sets - 3-5 reps - 10 sec  hold - Anterior Shoulder and Biceps Stretch  - 2 x daily - 7 x weekly - 1 sets - 3 reps - 30 sec  hold  ASSESSMENT:  CLINICAL IMPRESSION: Patient is a 82 y.o. female who was seen today for physical therapy evaluation and treatment for cervicalgia and L UE pain. She has negative cervical exam; pain with AROM L shoulder; pain with PROM L shoulder; pain with resistive testing L shoulder; pain with palpation anterior L shoulder through area of biceps tendon, infraspinatus, teres. She will benefit from PT to address problems identified.   OBJECTIVE IMPAIRMENTS: decreased activity tolerance, decreased mobility, decreased ROM, decreased strength, hypomobility, increased fascial restrictions, increased muscle spasms, impaired UE functional use, improper body mechanics, postural dysfunction, and pain.   ACTIVITY LIMITATIONS: carrying, lifting, sleeping, toileting, dressing, and reach over head  PARTICIPATION LIMITATIONS: meal prep,  cleaning, laundry, driving, community activity, and yard work  PERSONAL FACTORS: Age, Past/current experiences, and Time since onset of injury/illness/exacerbation are also affecting patient's functional outcome.   REHAB POTENTIAL: Good  CLINICAL DECISION MAKING: Stable/uncomplicated  EVALUATION COMPLEXITY: Low   GOALS: Goals reviewed with patient? Yes  SHORT TERM GOALS: Target date: 07/03/2023   Independent in initial HEP  Baseline:  Goal status: INITIAL  2.  Decrease L shoulder pain by 25-50% allowing patient to use L UE for light functional activities at home  Baseline:  Goal status: INITIAL   LONG TERM GOALS: Target date: 08/14/2023   Improve posture and alignment with patient to demonstrate improved posture with posterior shoulder girdle musculature engaged  Baseline:  Goal status: INITIAL  2.  Increase AROM L shoulder to =/> than AROM R shoulder  Baseline:  Goal status: INITIAL  3.  4/5 to 5/5 strength L shoulder  Baseline:  Goal status: INITIAL  4.  Decreased pain L shoulder by 70-100% allowing patient to return to normal functional activities including yard work and household chores  Baseline:  Goal status: INITIAL  5.  Independent in HEP including aquatic therapy as  indicated  Baseline:  Goal status: INITIAL  6.  Improve functional limitation score to 61 Baseline: 47 Goal status: INITIAL   PLAN:  PT FREQUENCY: 2x/week  PT DURATION: 12 weeks  PLANNED INTERVENTIONS: Therapeutic exercises, Therapeutic activity, Neuromuscular re-education, Balance training, Gait training, Patient/Family education, Self Care, Joint mobilization, Aquatic Therapy, Dry Needling, Spinal mobilization, Cryotherapy, Moist heat, Taping, Ultrasound, Ionotophoresis 4mg /ml Dexamethasone, Manual therapy, and Re-evaluation  PLAN FOR NEXT SESSION: review and progress exercise; continue with postural correction and education as well as spine care education; manual work, DN, modalities  as indicated    Christine Barry, PT 05/22/2023, 4:35 PM

## 2023-05-22 ENCOUNTER — Ambulatory Visit: Payer: PPO | Attending: Medical-Surgical | Admitting: Rehabilitative and Restorative Service Providers"

## 2023-05-22 ENCOUNTER — Encounter: Payer: Self-pay | Admitting: Rehabilitative and Restorative Service Providers"

## 2023-05-22 ENCOUNTER — Other Ambulatory Visit: Payer: Self-pay

## 2023-05-22 DIAGNOSIS — M25512 Pain in left shoulder: Secondary | ICD-10-CM | POA: Insufficient documentation

## 2023-05-22 DIAGNOSIS — M6281 Muscle weakness (generalized): Secondary | ICD-10-CM | POA: Diagnosis not present

## 2023-05-22 DIAGNOSIS — M5412 Radiculopathy, cervical region: Secondary | ICD-10-CM | POA: Insufficient documentation

## 2023-05-22 DIAGNOSIS — R29898 Other symptoms and signs involving the musculoskeletal system: Secondary | ICD-10-CM

## 2023-05-23 ENCOUNTER — Other Ambulatory Visit: Payer: Self-pay | Admitting: Pulmonary Disease

## 2023-05-27 ENCOUNTER — Ambulatory Visit: Payer: PPO

## 2023-05-27 DIAGNOSIS — M5412 Radiculopathy, cervical region: Secondary | ICD-10-CM | POA: Diagnosis not present

## 2023-05-27 DIAGNOSIS — M25551 Pain in right hip: Secondary | ICD-10-CM

## 2023-05-27 DIAGNOSIS — M25512 Pain in left shoulder: Secondary | ICD-10-CM

## 2023-05-27 DIAGNOSIS — R29898 Other symptoms and signs involving the musculoskeletal system: Secondary | ICD-10-CM

## 2023-05-27 DIAGNOSIS — M6281 Muscle weakness (generalized): Secondary | ICD-10-CM

## 2023-05-27 NOTE — Therapy (Signed)
OUTPATIENT PHYSICAL THERAPY CERVICAL TREATMENT   Patient Name: Analice Holtz MRN: [id number] DOB:June 08, 1941, 82 y.o., female Today's Date: 05/27/2023  END OF SESSION:  PT End of Session - 05/27/23 1442     Visit Number 2    Number of Visits 24    Date for PT Re-Evaluation 08/14/23    Authorization Type [employer]    Progress Note Due on Visit 10    PT Start Time 1443    PT Stop Time 1535    PT Time Calculation (min) 52 min    Activity Tolerance Patient tolerated treatment well    Behavior During Therapy WFL for tasks assessed/performed             Past Medical History:  Diagnosis Date   Allergy 1964   Anxiety 1986   Arthritis    Asthma    COPD (chronic obstructive pulmonary disease) (HCC)    Emphysema of lung (HCC) 2016   Hyperlipidemia    Thyroid disease    Past Surgical History:  Procedure Laterality Date   CESAREAN SECTION     EYE SURGERY  2018   Cataracts   Patient Active Problem List   Diagnosis Date Noted   Weight gain 06/08/2022   Grief at loss of child 06/08/2022   Postoperative hypothyroidism 03/08/2021   Centrilobular emphysema (HCC) 03/08/2021   Moderate persistent asthma without complication 03/08/2021   Osteoporosis screening 03/08/2021   Right hip pain 08/23/2020   Anxiety 07/23/2019   Diverticulosis 07/23/2019   Elevated cholesterol 07/23/2019   History of nicotine dependence 07/23/2019   Hyperplastic colon polyp 07/23/2019   Increased glucose level 07/23/2019   Osteoarthritis 07/23/2019   Redundant colon 07/23/2019   Seasonal allergic rhinitis 07/23/2019   Sensorineural hearing loss (SNHL) of both ears 07/23/2019   Vitamin D deficiency 07/23/2019   Xerosis of skin 07/23/2019   Chronic obstructive pulmonary disease (HCC) 07/23/2019   Pseudoptosis 09/16/2013   Dermatochalasis 09/16/2013    PCP: Christen Butter, NP  REFERRING PROVIDER: Christen Butter, NP  REFERRING DIAG: cervicalgia  THERAPY DIAG:  Cervical  radiculopathy  Acute pain of left shoulder  Muscle weakness (generalized)  Other symptoms and signs involving the musculoskeletal system  Right hip pain  Rationale for Evaluation and Treatment: Rehabilitation  ONSET DATE: 04/28/23  SUBJECTIVE:                                                                                                                                                                                                         SUBJECTIVE STATEMENT: Patient reports soreness  in L upper arm, continues to have sharp pain with certain movements.  Hand dominance: Right  PERTINENT HISTORY:  R hip pain; arthritis   PAIN:  Are you having pain? Yes: NPRS scale: 0/10 at rest 6/10 with lifting arm  Pain location: L shoulder to middle of the L arm  Pain description: sharp Aggravating factors: lifting arm; reaching down;  Relieving factors: not moving arm; no change with prednisone   PRECAUTIONS: None  RED FLAGS: None     WEIGHT BEARING RESTRICTIONS: No  FALLS:  Has patient fallen in last 6 months? 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 She does some household chores and cooking; yard work; yoga 1x/wk at [employer]; church; reading; on line games   PLOF: Independent  PATIENT GOALS: get rid of the L arm and use the arm normally   NEXT MD VISIT: 08/12/23  OBJECTIVE:   DIAGNOSTIC FINDINGS:  Cervical xray 05/20/23:  Severe multilevel spondylosis and facet hypertrophy, most pronounced at the C5-6 level as above. No acute displaced fracture  PATIENT SURVEYS:  FOTO 47; goal 61  COGNITION: Overall cognitive status: Within functional limits for tasks assessed  SENSATION: WFL  POSTURE: Patient presents with head forward posture with increased thoracic kyphosis; shoulders rounded and  elevated; scapulae abducted and rotated along the thoracic spine; head of the humerus anterior in orientation.   PALPATION: Pain with palpation L shoulder anterior shoulder at biceps tendon area; posterior shoulder at infraspinatus/teres area    CERVICAL ROM:   Active ROM A/PROM (deg) eval  Flexion 47  Extension 46  Right lateral flexion 28  Left lateral flexion 32  Right rotation 62  Left rotation 59   (Blank rows = not tested)  UPPER EXTREMITY ROM:  Active ROM Right eval Left eval  Shoulder flexion 147 66 pain L arm   Shoulder extension 67 pull 55 tight  Shoulder abduction 158 56 pain L arm   Shoulder adduction    Shoulder extension 57 arm at side  55 at side pull   Shoulder internal rotation Thumb T7 Thumb T10 pull L arm   Shoulder external rotation    Elbow flexion    Elbow extension    Wrist flexion    Wrist extension    Wrist ulnar deviation    Wrist radial deviation    Wrist pronation    Wrist supination     (Blank rows = not tested)  UPPER EXTREMITY MMT:  MMT Right eval Left eval  Shoulder flexion 5 3- pain  Shoulder extension 5 4 pain  Shoulder abduction 5 3- pain  Shoulder adduction    Shoulder extension 5 4  Shoulder internal rotation 5 4 pain  Shoulder external rotation 5 4+  Middle trapezius 4 3  Lower trapezius 4 3  Elbow flexion    Elbow extension    Wrist flexion    Wrist extension    Wrist ulnar deviation    Wrist radial deviation    Wrist pronation    Wrist supination    Grip strength     (Blank rows = not tested)  CERVICAL SPECIAL TESTS:  Spurling's test: Negative, and Distraction test: Negative  [facility] Adult PT Treatment:  DATE: 05/27/2023 Therapeutic Exercise: Seated shoulder flexion AAROM  Standing bicep stretch 2x30" Seated anterior shoulder/bicep stretch (L) --> seated edge of table Supine chin tucks (towel roll) --> increased rib flare Supine shoulder depression (hands on  balls) --> active assist Supine passive bicep stretch --> arm abd 60deg, forearm supinated x2 min Seated AROM supin/pron, exte/flex Resisted eccentric bicep curl YTB x10 Resisted forearm supin/pronat 1#DB x10 Modalities: Iontophoresis L bicep x 4 hour (patch #2)    [facility] Adult PT Treatment:                                                DATE: 05/22/23 Therapeutic Exercise: Supine  Chin tuck 5 sec x 5 Chest lift 5 sec x 5  AA -> PROM L shoulder 10 sec x 2  Sitting  Biceps stretch 20 sec x 2  Manual Therapy: STM L anterior shoulder  Neuromuscular re-ed: Initiated postural correction  Modalities: Iontophoresis 1 cc dose 8 hour patch  Self Care: Avoid lifting with L UE     PATIENT EDUCATION:  Education details: poC; HEP Person educated: Patient Education method: Explanation, Demonstration, Tactile cues, Verbal cues, and Handouts Education comprehension: verbalized understanding, returned demonstration, verbal cues required, tactile cues required, and needs further education  HOME EXERCISE PROGRAM: Access Code: [email or web address] URL: [email or web address] Date: 05/22/2023 Prepared by: Corlis Leak  Exercises - Supine Cervical Retraction with Towel  - 2 x daily - 7 x weekly - 1 sets - 5-10 reps - 10 sec  hold - Supine Scapular Retraction  - 2 x daily - 7 x weekly - 1 sets - 10 reps - 5-10 sec  hold - Supine Shoulder Flexion AAROM with Hands Clasped  - 2 x daily - 7 x weekly - 1 sets - 3-5 reps - 10 sec  hold - Anterior Shoulder and Biceps Stretch  - 2 x daily - 7 x weekly - 1 sets - 3 reps - 30 sec  hold  ASSESSMENT:  CLINICAL IMPRESSION: Eccentric strengthening exercises for L bicep incorporated during session. Tactile cues needed to improve shoulder depression mechanics and decrease shoulder internal rotation compensation; patient had difficulty mobilizing scapula in supine position.   EVAL: Patient is a 82 y.o. female who was seen today for physical therapy  evaluation and treatment for cervicalgia and L UE pain. She has negative cervical exam; pain with AROM L shoulder; pain with PROM L shoulder; pain with resistive testing L shoulder; pain with palpation anterior L shoulder through area of biceps tendon, infraspinatus, teres. She will benefit from PT to address problems identified.   OBJECTIVE IMPAIRMENTS: decreased activity tolerance, decreased mobility, decreased ROM, decreased strength, hypomobility, increased fascial restrictions, increased muscle spasms, impaired UE functional use, improper body mechanics, postural dysfunction, and pain.   ACTIVITY LIMITATIONS: carrying, lifting, sleeping, toileting, dressing, and reach over head  PARTICIPATION LIMITATIONS: meal prep, cleaning, laundry, driving, community activity, and yard work  PERSONAL FACTORS: Age, Past/current experiences, and Time since onset of injury/illness/exacerbation are also affecting patient's functional outcome.   REHAB POTENTIAL: Good  CLINICAL DECISION MAKING: Stable/uncomplicated  EVALUATION COMPLEXITY: Low   GOALS: Goals reviewed with patient? Yes  SHORT TERM GOALS: Target date: 07/03/2023  Independent in initial HEP  Baseline:  Goal status: INITIAL  2.  Decrease L shoulder pain by 25-50% allowing patient to use L UE for light functional activities at  home  Baseline:  Goal status: INITIAL   LONG TERM GOALS: Target date: 08/14/2023  Improve posture and alignment with patient to demonstrate improved posture with posterior shoulder girdle musculature engaged  Baseline:  Goal status: INITIAL  2.  Increase AROM L shoulder to =/> than AROM R shoulder  Baseline:  Goal status: INITIAL  3.  4/5 to 5/5 strength L shoulder  Baseline:  Goal status: INITIAL  4.  Decreased pain L shoulder by 70-100% allowing patient to return to normal functional activities including yard work and household chores  Baseline:  Goal status: INITIAL  5.  Independent in HEP  including aquatic therapy as indicated  Baseline:  Goal status: INITIAL  6.  Improve functional limitation score to 61 Baseline: 47 Goal status: INITIAL   PLAN:  PT FREQUENCY: 2x/week  PT DURATION: 12 weeks  PLANNED INTERVENTIONS: Therapeutic exercises, Therapeutic activity, Neuromuscular re-education, Balance training, Gait training, Patient/Family education, Self Care, Joint mobilization, Aquatic Therapy, Dry Needling, Spinal mobilization, Cryotherapy, Moist heat, Taping, Ultrasound, Ionotophoresis 4mg /ml Dexamethasone, Manual therapy, and Re-evaluation  PLAN FOR NEXT SESSION: review and progress exercise; continue with postural correction and education as well as spine care education; manual work, DN, modalities as indicated    Sanjuana Mae, PTA 05/27/2023, 4:29 PM

## 2023-05-29 ENCOUNTER — Encounter: Payer: Self-pay | Admitting: Physical Therapy

## 2023-05-29 ENCOUNTER — Ambulatory Visit: Payer: PPO | Admitting: Physical Therapy

## 2023-05-29 DIAGNOSIS — M6281 Muscle weakness (generalized): Secondary | ICD-10-CM

## 2023-05-29 DIAGNOSIS — M5412 Radiculopathy, cervical region: Secondary | ICD-10-CM | POA: Diagnosis not present

## 2023-05-29 DIAGNOSIS — M25512 Pain in left shoulder: Secondary | ICD-10-CM

## 2023-05-29 NOTE — Therapy (Signed)
OUTPATIENT PHYSICAL THERAPY CERVICAL TREATMENT   Patient Name: Christine Barry MRN: [id number] DOB:1941-03-05, 82 y.o., female Today's Date: 05/29/2023  END OF SESSION:  PT End of Session - 05/29/23 1513     Visit Number 3    Number of Visits 24    Date for PT Re-Evaluation 08/14/23    Authorization Type [employer]    Authorization - Visit Number 3    Progress Note Due on Visit 10    PT Start Time 1439    PT Stop Time 1517    PT Time Calculation (min) 38 min    Activity Tolerance Patient tolerated treatment well    Behavior During Therapy WFL for tasks assessed/performed              Past Medical History:  Diagnosis Date   Allergy 1964   Anxiety 1986   Arthritis    Asthma    COPD (chronic obstructive pulmonary disease) (HCC)    Emphysema of lung (HCC) 2016   Hyperlipidemia    Thyroid disease    Past Surgical History:  Procedure Laterality Date   CESAREAN SECTION     EYE SURGERY  2018   Cataracts   Patient Active Problem List   Diagnosis Date Noted   Weight gain 06/08/2022   Grief at loss of child 06/08/2022   Postoperative hypothyroidism 03/08/2021   Centrilobular emphysema (HCC) 03/08/2021   Moderate persistent asthma without complication 03/08/2021   Osteoporosis screening 03/08/2021   Right hip pain 08/23/2020   Anxiety 07/23/2019   Diverticulosis 07/23/2019   Elevated cholesterol 07/23/2019   History of nicotine dependence 07/23/2019   Hyperplastic colon polyp 07/23/2019   Increased glucose level 07/23/2019   Osteoarthritis 07/23/2019   Redundant colon 07/23/2019   Seasonal allergic rhinitis 07/23/2019   Sensorineural hearing loss (SNHL) of both ears 07/23/2019   Vitamin D deficiency 07/23/2019   Xerosis of skin 07/23/2019   Chronic obstructive pulmonary disease (HCC) 07/23/2019   Pseudoptosis 09/16/2013   Dermatochalasis 09/16/2013    PCP: Christen Butter, NP  REFERRING PROVIDER: Christen Butter, NP  REFERRING DIAG:  cervicalgia  THERAPY DIAG:  Cervical radiculopathy  Acute pain of left shoulder  Muscle weakness (generalized)  Rationale for Evaluation and Treatment: Rehabilitation  ONSET DATE: 04/28/23  SUBJECTIVE:                                                                                                                                                                                                         SUBJECTIVE STATEMENT: Pt states she did her exercises at  home. She used a 2# weight so she was more sore  Hand dominance: Right  PERTINENT HISTORY:  R hip pain; arthritis   PAIN:  Are you having pain? Yes: NPRS scale: 0/10 at rest 6/10 with lifting arm  Pain location: L shoulder to middle of the L arm  Pain description: sharp Aggravating factors: lifting arm; reaching down;  Relieving factors: not moving arm; no change with prednisone   PRECAUTIONS: None  RED FLAGS: None     WEIGHT BEARING RESTRICTIONS: No  FALLS:  Has patient fallen in last 6 months? 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 She does some household chores and cooking; yard work; yoga 1x/wk at [employer]; church; reading; on line games   PLOF: Independent  PATIENT GOALS: get rid of the L arm and use the arm normally   NEXT MD VISIT: 08/12/23  OBJECTIVE:   DIAGNOSTIC FINDINGS:  Cervical xray 05/20/23:  Severe multilevel spondylosis and facet hypertrophy, most pronounced at the C5-6 level as above. No acute displaced fracture  PATIENT SURVEYS:  FOTO 47; goal 61  COGNITION: Overall cognitive status: Within functional limits for tasks assessed  SENSATION: WFL  POSTURE: Patient presents with head forward posture with increased thoracic kyphosis; shoulders rounded and elevated; scapulae abducted and rotated along the thoracic  spine; head of the humerus anterior in orientation.   PALPATION: Pain with palpation L shoulder anterior shoulder at biceps tendon area; posterior shoulder at infraspinatus/teres area    CERVICAL ROM:   Active ROM A/PROM (deg) eval  Flexion 47  Extension 46  Right lateral flexion 28  Left lateral flexion 32  Right rotation 62  Left rotation 59   (Blank rows = not tested)  UPPER EXTREMITY ROM:  Active ROM Right eval Left eval  Shoulder flexion 147 66 pain L arm   Shoulder extension 67 pull 55 tight  Shoulder abduction 158 56 pain L arm   Shoulder adduction    Shoulder extension 57 arm at side  55 at side pull   Shoulder internal rotation Thumb T7 Thumb T10 pull L arm   Shoulder external rotation    Elbow flexion    Elbow extension    Wrist flexion    Wrist extension    Wrist ulnar deviation    Wrist radial deviation    Wrist pronation    Wrist supination     (Blank rows = not tested)  UPPER EXTREMITY MMT:  MMT Right eval Left eval  Shoulder flexion 5 3- pain  Shoulder extension 5 4 pain  Shoulder abduction 5 3- pain  Shoulder adduction    Shoulder extension 5 4  Shoulder internal rotation 5 4 pain  Shoulder external rotation 5 4+  Middle trapezius 4 3  Lower trapezius 4 3  Elbow flexion    Elbow extension    Wrist flexion    Wrist extension    Wrist ulnar deviation    Wrist radial deviation    Wrist pronation    Wrist supination    Grip strength     (Blank rows = not tested)  CERVICAL SPECIAL TESTS:  Spurling's test: Negative, and Distraction test: Negative  [facility] Adult PT Treatment:  DATE: 05/29/23 Therapeutic Exercise: Anterior shoulder/bicep stretch standing 2 x 30 sec Shoulder flexion AROM to tolerance Scap squeezes x 12 "L"'s x 12 "W"s x 12 Attempted horizontal abd - limited by pain Eccentric bicep curl red TB 2 x 10 Lt supination/pronation AROM x 12 Manual Therapy: GH inferior glides grade  2-3 GH distraction STM Lt bicep  Modalities: Iontophoresis L bicep x 4 hour (patch #3)   [facility] Adult PT Treatment:                                                DATE: 05/27/2023 Therapeutic Exercise: Seated shoulder flexion AAROM  Standing bicep stretch 2x30" Seated anterior shoulder/bicep stretch (L) --> seated edge of table Supine chin tucks (towel roll) --> increased rib flare Supine shoulder depression (hands on balls) --> active assist Supine passive bicep stretch --> arm abd 60deg, forearm supinated x2 min Seated AROM supin/pron, exte/flex Resisted eccentric bicep curl YTB x10 Resisted forearm supin/pronat 1#DB x10 Modalities: Iontophoresis L bicep x 4 hour (patch #2)    [facility] Adult PT Treatment:                                                DATE: 05/22/23 Therapeutic Exercise: Supine  Chin tuck 5 sec x 5 Chest lift 5 sec x 5  AA -> PROM L shoulder 10 sec x 2  Sitting  Biceps stretch 20 sec x 2  Manual Therapy: STM L anterior shoulder  Neuromuscular re-ed: Initiated postural correction  Modalities: Iontophoresis 1 cc dose 8 hour patch  Self Care: Avoid lifting with L UE     PATIENT EDUCATION:  Education details: poC; HEP Person educated: Patient Education method: Explanation, Demonstration, Tactile cues, Verbal cues, and Handouts Education comprehension: verbalized understanding, returned demonstration, verbal cues required, tactile cues required, and needs further education  HOME EXERCISE PROGRAM: Access Code: [email or web address] URL: [email or web address] Date: 05/22/2023 Prepared by: Corlis Leak  Exercises - Supine Cervical Retraction with Towel  - 2 x daily - 7 x weekly - 1 sets - 5-10 reps - 10 sec  hold - Supine Scapular Retraction  - 2 x daily - 7 x weekly - 1 sets - 10 reps - 5-10 sec  hold - Supine Shoulder Flexion AAROM with Hands Clasped  - 2 x daily - 7 x weekly - 1 sets - 3-5 reps - 10 sec  hold - Anterior Shoulder and Biceps Stretch  - 2  x daily - 7 x weekly - 1 sets - 3 reps - 30 sec  hold  ASSESSMENT:  CLINICAL IMPRESSION: Focused on postural strengthening today. Encouraged pt to perform scap squeezes at home. Good tolerance to scapular strengthening, decreased tolerance to shoulder ROM during activity  EVAL: Patient is a 82 y.o. female who was seen today for physical therapy evaluation and treatment for cervicalgia and L UE pain. She has negative cervical exam; pain with AROM L shoulder; pain with PROM L shoulder; pain with resistive testing L shoulder; pain with palpation anterior L shoulder through area of biceps tendon, infraspinatus, teres. She will benefit from PT to address problems identified.   OBJECTIVE IMPAIRMENTS: decreased activity tolerance, decreased mobility, decreased ROM, decreased strength, hypomobility, increased fascial restrictions, increased muscle spasms,  impaired UE functional use, improper body mechanics, postural dysfunction, and pain.    GOALS: Goals reviewed with patient? Yes  SHORT TERM GOALS: Target date: 07/03/2023  Independent in initial HEP  Baseline:  Goal status: INITIAL  2.  Decrease L shoulder pain by 25-50% allowing patient to use L UE for light functional activities at home  Baseline:  Goal status: INITIAL   LONG TERM GOALS: Target date: 08/14/2023  Improve posture and alignment with patient to demonstrate improved posture with posterior shoulder girdle musculature engaged  Baseline:  Goal status: INITIAL  2.  Increase AROM L shoulder to =/> than AROM R shoulder  Baseline:  Goal status: INITIAL  3.  4/5 to 5/5 strength L shoulder  Baseline:  Goal status: INITIAL  4.  Decreased pain L shoulder by 70-100% allowing patient to return to normal functional activities including yard work and household chores  Baseline:  Goal status: INITIAL  5.  Independent in HEP including aquatic therapy as indicated  Baseline:  Goal status: INITIAL  6.  Improve functional limitation  score to 61 Baseline: 47 Goal status: INITIAL   PLAN:  PT FREQUENCY: 2x/week  PT DURATION: 12 weeks  PLANNED INTERVENTIONS: Therapeutic exercises, Therapeutic activity, Neuromuscular re-education, Balance training, Gait training, Patient/Family education, Self Care, Joint mobilization, Aquatic Therapy, Dry Needling, Spinal mobilization, Cryotherapy, Moist heat, Taping, Ultrasound, Ionotophoresis 4mg /ml Dexamethasone, Manual therapy, and Re-evaluation  PLAN FOR NEXT SESSION: update HEP, continue with postural correction and education as well as spine care education; manual work, DN, modalities as indicated    Brody Kump, PT 05/29/2023, 3:14 PM

## 2023-06-03 ENCOUNTER — Encounter: Payer: Self-pay | Admitting: Rehabilitative and Restorative Service Providers"

## 2023-06-03 ENCOUNTER — Other Ambulatory Visit: Payer: Self-pay | Admitting: Pulmonary Disease

## 2023-06-03 ENCOUNTER — Encounter: Payer: Self-pay | Admitting: Medical-Surgical

## 2023-06-03 ENCOUNTER — Ambulatory Visit: Payer: PPO | Admitting: Rehabilitative and Restorative Service Providers"

## 2023-06-03 DIAGNOSIS — M25512 Pain in left shoulder: Secondary | ICD-10-CM

## 2023-06-03 DIAGNOSIS — M5412 Radiculopathy, cervical region: Secondary | ICD-10-CM | POA: Diagnosis not present

## 2023-06-03 DIAGNOSIS — M6281 Muscle weakness (generalized): Secondary | ICD-10-CM

## 2023-06-03 DIAGNOSIS — M25551 Pain in right hip: Secondary | ICD-10-CM

## 2023-06-03 DIAGNOSIS — R29898 Other symptoms and signs involving the musculoskeletal system: Secondary | ICD-10-CM

## 2023-06-03 NOTE — Therapy (Signed)
OUTPATIENT PHYSICAL THERAPY CERVICAL TREATMENT   Patient Name: Christine Barry MRN: [id number] DOB:14-Sep-1941, 82 y.o., female Today's Date: 06/03/2023  END OF SESSION:  PT End of Session - 06/03/23 1447     Visit Number 4    Number of Visits 24    Date for PT Re-Evaluation 08/14/23    Authorization - Visit Number 4    Progress Note Due on Visit 10    PT Start Time 1445    PT Stop Time 1531    PT Time Calculation (min) 46 min    Activity Tolerance Patient tolerated treatment well              Past Medical History:  Diagnosis Date   Allergy 1964   Anxiety 1986   Arthritis    Asthma    COPD (chronic obstructive pulmonary disease) (HCC)    Emphysema of lung (HCC) 2016   Hyperlipidemia    Thyroid disease    Past Surgical History:  Procedure Laterality Date   CESAREAN SECTION     EYE SURGERY  2018   Cataracts   Patient Active Problem List   Diagnosis Date Noted   Weight gain 06/08/2022   Grief at loss of child 06/08/2022   Postoperative hypothyroidism 03/08/2021   Centrilobular emphysema (HCC) 03/08/2021   Moderate persistent asthma without complication 03/08/2021   Osteoporosis screening 03/08/2021   Right hip pain 08/23/2020   Anxiety 07/23/2019   Diverticulosis 07/23/2019   Elevated cholesterol 07/23/2019   History of nicotine dependence 07/23/2019   Hyperplastic colon polyp 07/23/2019   Increased glucose level 07/23/2019   Osteoarthritis 07/23/2019   Redundant colon 07/23/2019   Seasonal allergic rhinitis 07/23/2019   Sensorineural hearing loss (SNHL) of both ears 07/23/2019   Vitamin D deficiency 07/23/2019   Xerosis of skin 07/23/2019   Chronic obstructive pulmonary disease (HCC) 07/23/2019   Pseudoptosis 09/16/2013   Dermatochalasis 09/16/2013    PCP: Christen Butter, NP  REFERRING PROVIDER: Christen Butter, NP  REFERRING DIAG: cervicalgia  THERAPY DIAG:  Cervical radiculopathy  Acute pain of left shoulder  Muscle weakness  (generalized)  Other symptoms and signs involving the musculoskeletal system  Right hip pain  Rationale for Evaluation and Treatment: Rehabilitation  ONSET DATE: 04/28/23  SUBJECTIVE:                                                                                                                                                                                                         SUBJECTIVE STATEMENT: Pt states she has had some improvement in the L shoulder.  She can do more with her shoulder but it still hurts with certain movements. Working on her exercises. She has more time with less pain. Awoke at 2:30 this morning and took medication and went back to sleep. Thinks she would like to try a cortisone injection which has helped with pains in the past.    Hand dominance: Right  PERTINENT HISTORY:  R hip pain; arthritis   PAIN:  Are you having pain? Yes: NPRS scale: 0/10 at rest 6/10 with lifting arm  Pain location: L shoulder to middle of the L arm  Pain description: sharp Aggravating factors: lifting arm; reaching down;  Relieving factors: not moving arm; no change with prednisone   PRECAUTIONS: None    WEIGHT BEARING RESTRICTIONS: No  FALLS:  Has patient fallen in last 6 months? 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 She does some household chores and cooking; yard work; yoga 1x/wk at [employer]; church; reading; on line games   PATIENT GOALS: get rid of the L arm and use the arm normally   NEXT MD VISIT: 08/12/23  OBJECTIVE:   DIAGNOSTIC FINDINGS:  Cervical xray 05/20/23:  Severe multilevel spondylosis and facet hypertrophy, most pronounced at the C5-6 level as above. No acute displaced fracture  PATIENT SURVEYS:  FOTO 47; goal 61  COGNITION: Overall cognitive status: Within functional  limits for tasks assessed  SENSATION: WFL  POSTURE: Patient presents with head forward posture with increased thoracic kyphosis; shoulders rounded and elevated; scapulae abducted and rotated along the thoracic spine; head of the humerus anterior in orientation.   PALPATION: Pain with palpation L shoulder anterior shoulder at biceps tendon area; posterior shoulder at infraspinatus/teres area    CERVICAL ROM:   Active ROM A/PROM (deg) eval  Flexion 47  Extension 46  Right lateral flexion 28  Left lateral flexion 32  Right rotation 62  Left rotation 59   (Blank rows = not tested)  UPPER EXTREMITY ROM:  Active ROM Right eval Left eval  Shoulder flexion 147 66 pain L arm   Shoulder extension 67 pull 55 tight  Shoulder abduction 158 56 pain L arm   Shoulder adduction    Shoulder extension 57 arm at side  55 at side pull   Shoulder internal rotation Thumb T7 Thumb T10 pull L arm   Shoulder external rotation    Elbow flexion    Elbow extension    Wrist flexion    Wrist extension    Wrist ulnar deviation    Wrist radial deviation    Wrist pronation    Wrist supination     (Blank rows = not tested)  UPPER EXTREMITY MMT:  MMT Right eval Left eval  Shoulder flexion 5 3- pain  Shoulder extension 5 4 pain  Shoulder abduction 5 3- pain  Shoulder adduction    Shoulder extension 5 4  Shoulder internal rotation 5 4 pain  Shoulder external rotation 5 4+  Middle trapezius 4 3  Lower trapezius 4 3  Elbow flexion    Elbow extension    Wrist flexion    Wrist extension    Wrist ulnar deviation    Wrist radial deviation    Wrist pronation    Wrist supination    Grip strength     (Blank rows = not tested)  CERVICAL SPECIAL TESTS:  Spurling's test: Negative,  and Distraction test: Negative  [facility] Adult PT Treatment:                                                DATE: 06/03/23 Therapeutic Exercise: Sitting Shoulder flexion AROM to tolerance x 4 reps  Scap squeezes x  12 "L"'s x 12 "W"s x 12 Lt supination/pronation AROM elbow in at side x 12 Supine  Scap squeeze shoulder extension isometric 3 sec x 10  Standing   L shoulder isometric abduction 10 sec x 10  L shoulder ER isometric 10 sec x 10  L shoulder IR isometric 10 sec x 10  Manual Therapy: STM L shoulder girdle tightness noted in upper traps; supraspinatus; leveator; teres; biceps; deltoid  PROM L shoulder into flexion; scaption; ER in scaption no pain   Modalities:   [facility] Adult PT Treatment:                                                DATE: 05/29/23 Therapeutic Exercise: Anterior shoulder/bicep stretch standing 2 x 30 sec Shoulder flexion AROM to tolerance Scap squeezes x 12 "L"'s x 12 "W"s x 12 Attempted horizontal abd - limited by pain Eccentric bicep curl red TB 2 x 10 Lt supination/pronation AROM x 12 Manual Therapy: GH inferior glides grade 2-3 GH distraction STM Lt bicep  Modalities: Iontophoresis L bicep x 4 hour (patch #3)   [facility] Adult PT Treatment:                                                DATE: 05/27/2023 Therapeutic Exercise: Seated shoulder flexion AAROM  Standing bicep stretch 2x30" Seated anterior shoulder/bicep stretch (L) --> seated edge of table Supine chin tucks (towel roll) --> increased rib flare Supine shoulder depression (hands on balls) --> active assist Supine passive bicep stretch --> arm abd 60deg, forearm supinated x2 min Seated AROM supin/pron, exte/flex Resisted eccentric bicep curl YTB x10 Resisted forearm supin/pronat 1#DB x10 Modalities: Iontophoresis L bicep x 4 hour (patch #2)    PATIENT EDUCATION:  Education details: poC; HEP Person educated: Patient Education method: Explanation, Demonstration, Tactile cues, Verbal cues, and Handouts Education comprehension: verbalized understanding, returned demonstration, verbal cues required, tactile cues required, and needs further education  HOME EXERCISE PROGRAM: Access Code:  [email or web address] URL: [email or web address] Date: 06/03/2023 Prepared by: Corlis Leak  Exercises - Supine Cervical Retraction with Towel  - 2 x daily - 7 x weekly - 1 sets - 5-10 reps - 10 sec  hold - Supine Scapular Retraction  - 2 x daily - 7 x weekly - 1 sets - 10 reps - 5-10 sec  hold - Supine Shoulder Flexion AAROM with Hands Clasped  - 2 x daily - 7 x weekly - 1 sets - 3-5 reps - 10 sec  hold - Anterior Shoulder and Biceps Stretch  - 2 x daily - 7 x weekly - 1 sets - 3 reps - 30 sec  hold - Forearm Supination with Dumbbell  - 1 x daily - 7 x weekly - 3 sets - 10 reps - Seated Elbow Flexion  with Self-Anchored Resistance  - 1 x daily - 7 x weekly - 3 sets - 10 reps - Standing Bicep Stretch at Wall  - 1 x daily - 7 x weekly - 1 sets - 3-5 reps - 20-30 sec hold - Supine Scapular Retraction  - 2 x daily - 7 x weekly - 1 sets - 10 reps - 5-10 sec  hold - Isometric Shoulder Abduction at Wall  - 2 x daily - 7 x weekly - 1 sets - 5-10 reps - 5 sec  hold - Isometric Shoulder External Rotation at Wall  - 2 x daily - 7 x weekly - 1 sets - 5 reps - 5 sec  hold - Standing Isometric Shoulder Internal Rotation with Towel Roll at Doorway  - 2 x daily - 7 x weekly - 1 sets - 5 reps - 5 sec  hold  ASSESSMENT:  CLINICAL IMPRESSION: [employer] has demonstrated some improvement but has persistent pain in the L shoulder. She has increased functional activity tolerance at home but she has persistent pain with AROM in forward flexion and scaption. Note L shoulder girdle tightness in upper traps; supraspinatus; leveator; teres; biceps; deltoid. No pain with PROM L shoulder which would indicate muscle tendon pathology; likely rotator cuff. Focused on postural strengthening and isometric strengthening today. Encouraged pt to continue consistent HEP. Good tolerance to scapular strengthening including isometrics.   EVAL: Patient is a 82 y.o. female who was seen today for physical therapy evaluation and treatment for  cervicalgia and L UE pain. She has negative cervical exam; pain with AROM L shoulder; pain with PROM L shoulder; pain with resistive testing L shoulder; pain with palpation anterior L shoulder through area of biceps tendon, infraspinatus, teres. She will benefit from PT to address problems identified.   OBJECTIVE IMPAIRMENTS: decreased activity tolerance, decreased mobility, decreased ROM, decreased strength, hypomobility, increased fascial restrictions, increased muscle spasms, impaired UE functional use, improper body mechanics, postural dysfunction, and pain.    GOALS: Goals reviewed with patient? Yes  SHORT TERM GOALS: Target date: 07/03/2023  Independent in initial HEP  Baseline:  Goal status: INITIAL  2.  Decrease L shoulder pain by 25-50% allowing patient to use L UE for light functional activities at home  Baseline:  Goal status: INITIAL   LONG TERM GOALS: Target date: 08/14/2023  Improve posture and alignment with patient to demonstrate improved posture with posterior shoulder girdle musculature engaged  Baseline:  Goal status: INITIAL  2.  Increase AROM L shoulder to =/> than AROM R shoulder  Baseline:  Goal status: INITIAL  3.  4/5 to 5/5 strength L shoulder  Baseline:  Goal status: INITIAL  4.  Decreased pain L shoulder by 70-100% allowing patient to return to normal functional activities including yard work and household chores  Baseline:  Goal status: INITIAL  5.  Independent in HEP including aquatic therapy as indicated  Baseline:  Goal status: INITIAL  6.  Improve functional limitation score to 61 Baseline: 47 Goal status: INITIAL   PLAN:  PT FREQUENCY: 2x/week  PT DURATION: 12 weeks  PLANNED INTERVENTIONS: Therapeutic exercises, Therapeutic activity, Neuromuscular re-education, Balance training, Gait training, Patient/Family education, Self Care, Joint mobilization, Aquatic Therapy, Dry Needling, Spinal mobilization, Cryotherapy, Moist heat, Taping,  Ultrasound, Ionotophoresis 4mg /ml Dexamethasone, Manual therapy, and Re-evaluation  PLAN FOR NEXT SESSION: update HEP, continue with postural correction and education as well as spine care education; manual work, DN, modalities as indicated    [employer], PT 06/03/2023,  5:08 PM

## 2023-06-05 ENCOUNTER — Encounter: Payer: Self-pay | Admitting: Rehabilitative and Restorative Service Providers"

## 2023-06-05 ENCOUNTER — Ambulatory Visit: Payer: PPO | Admitting: Rehabilitative and Restorative Service Providers"

## 2023-06-05 DIAGNOSIS — M25551 Pain in right hip: Secondary | ICD-10-CM

## 2023-06-05 DIAGNOSIS — M5412 Radiculopathy, cervical region: Secondary | ICD-10-CM | POA: Diagnosis not present

## 2023-06-05 DIAGNOSIS — M25512 Pain in left shoulder: Secondary | ICD-10-CM

## 2023-06-05 DIAGNOSIS — M6281 Muscle weakness (generalized): Secondary | ICD-10-CM

## 2023-06-05 DIAGNOSIS — R29898 Other symptoms and signs involving the musculoskeletal system: Secondary | ICD-10-CM

## 2023-06-05 NOTE — Therapy (Signed)
OUTPATIENT PHYSICAL THERAPY CERVICAL TREATMENT   Patient Name: Christine Barry MRN: [id number] DOB:[birth year], 82 y.o., female Today's Date: 06/05/2023  END OF SESSION:  PT End of Session - 06/05/23 1450     Visit Number 5    Number of Visits 24    Date for PT Re-Evaluation 08/14/23    Authorization Type [employer]    Authorization - Visit Number 5    Progress Note Due on Visit 10    PT Start Time 1448    PT Stop Time 1533    PT Time Calculation (min) 45 min    Activity Tolerance Patient tolerated treatment well              Past Medical History:  Diagnosis Date   Allergy 1964   Anxiety 1986   Arthritis    Asthma    COPD (chronic obstructive pulmonary disease) (HCC)    Emphysema of lung (HCC) 2016   Hyperlipidemia    Thyroid disease    Past Surgical History:  Procedure Laterality Date   CESAREAN SECTION     EYE SURGERY  2018   Cataracts   Patient Active Problem List   Diagnosis Date Noted   Weight gain 06/08/2022   Grief at loss of child 06/08/2022   Postoperative hypothyroidism 03/08/2021   Centrilobular emphysema (HCC) 03/08/2021   Moderate persistent asthma without complication 03/08/2021   Osteoporosis screening 03/08/2021   Right hip pain 08/23/2020   Anxiety 07/23/2019   Diverticulosis 07/23/2019   Elevated cholesterol 07/23/2019   History of nicotine dependence 07/23/2019   Hyperplastic colon polyp 07/23/2019   Increased glucose level 07/23/2019   Osteoarthritis 07/23/2019   Redundant colon 07/23/2019   Seasonal allergic rhinitis 07/23/2019   Sensorineural hearing loss (SNHL) of both ears 07/23/2019   Vitamin D deficiency 07/23/2019   Xerosis of skin 07/23/2019   Chronic obstructive pulmonary disease (HCC) 07/23/2019   Pseudoptosis 09/16/2013   Dermatochalasis 09/16/2013    PCP: Christen Butter, NP  REFERRING PROVIDER: Christen Butter, NP  REFERRING DIAG: cervicalgia  THERAPY DIAG:  Cervical radiculopathy  Acute pain of left  shoulder  Muscle weakness (generalized)  Other symptoms and signs involving the musculoskeletal system  Right hip pain  Rationale for Evaluation and Treatment: Rehabilitation  ONSET DATE: 04/28/23  SUBJECTIVE:                                                                                                                                                                                                         SUBJECTIVE STATEMENT: [name] as an appointment  with Dr T tomorrow. Pt states she has noticed improvement in the L shoulder with current exercises and with avoiding activities that irritate the shoulder. Her shoulder but it still hurts with certain movements. She has more time with less pain, noticed when pulling garment off over her head.  Awoke at 1:30 this morning and went back to sleep without medication. She wants to try a cortisone injection which has helped with pains in different areas the past.    Hand dominance: Right  PERTINENT HISTORY:  R hip pain; arthritis   PAIN:  Are you having pain? Yes: NPRS scale: 0/10 at rest 6/10 with lifting arm  Pain location: L shoulder to middle of the L arm  Pain description: sharp Aggravating factors: lifting arm; reaching down;  Relieving factors: not moving arm; no change with prednisone   PRECAUTIONS: None    WEIGHT BEARING RESTRICTIONS: No  FALLS:  Has patient fallen in last 6 months? 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 She does some household chores and cooking; yard work; yoga 1x/wk at [employer]; church; reading; on line games   PATIENT GOALS: get rid of the L arm and use the arm normally   NEXT MD VISIT: 08/12/23  OBJECTIVE:   DIAGNOSTIC FINDINGS:  Cervical xray 05/20/23:  Severe multilevel spondylosis and facet hypertrophy,  most pronounced at the C5-6 level as above. No acute displaced fracture  PATIENT SURVEYS:  FOTO 47; goal 61  COGNITION: Overall cognitive status: Within functional limits for tasks assessed  SENSATION: WFL  POSTURE: Patient presents with head forward posture with increased thoracic kyphosis; shoulders rounded and elevated; scapulae abducted and rotated along the thoracic spine; head of the humerus anterior in orientation.   PALPATION: Pain with palpation L shoulder anterior shoulder at biceps tendon area; posterior shoulder at infraspinatus/teres area    CERVICAL ROM:   Active ROM A/PROM (deg) eval  Flexion 47  Extension 46  Right lateral flexion 28  Left lateral flexion 32  Right rotation 62  Left rotation 59   (Blank rows = not tested)  UPPER EXTREMITY ROM:  Active ROM Right eval Left eval  Shoulder flexion 147 66 pain L arm   Shoulder extension 67 pull 55 tight  Shoulder abduction 158 56 pain L arm   Shoulder adduction    Shoulder extension 57 arm at side  55 at side pull   Shoulder internal rotation Thumb T7 Thumb T10 pull L arm   Shoulder external rotation    Elbow flexion    Elbow extension    Wrist flexion    Wrist extension    Wrist ulnar deviation    Wrist radial deviation    Wrist pronation    Wrist supination     (Blank rows = not tested)  UPPER EXTREMITY MMT:  MMT Right eval Left eval  Shoulder flexion 5 3- pain  Shoulder extension 5 4 pain  Shoulder abduction 5 3- pain  Shoulder adduction    Shoulder extension 5 4  Shoulder internal rotation 5 4 pain  Shoulder external rotation 5 4+  Middle trapezius 4 3  Lower trapezius 4 3  Elbow flexion    Elbow extension    Wrist flexion    Wrist extension    Wrist ulnar deviation    Wrist radial deviation    Wrist pronation  Wrist supination    Grip strength     (Blank rows = not tested)  CERVICAL SPECIAL TESTS:  Spurling's test: Negative, and Distraction test: Negative  [facility] Adult PT  Treatment:                                                DATE: 06/05/23 Therapeutic Exercise: Sitting Shoulder flexion AROM to tolerance x 4 reps  Scap squeezes with noodle 10 sec x 10 "L"'s with noodle x 10 "W"s with noodle x 10 Lt supination/pronation AROM elbow in at side x 12 Supine  Scap squeeze shoulder extension isometric 3 sec x 10  Standing  L shoulder isometric abduction 10 sec x 10  L shoulder ER isometric 10 sec x 10  L shoulder IR isometric 10 sec x 10  Manual Therapy: STM L shoulder girdle tightness noted in upper traps; supraspinatus; leveator; teres; biceps; deltoid  PROM L shoulder into flexion; scaption; ER in scaption no pain   [facility] Adult PT Treatment:                                                DATE: 06/03/23 Therapeutic Exercise: Sitting Shoulder flexion AROM to tolerance x 4 reps  Scap squeezes x 12 "L"'s x 12 "W"s x 12 Lt supination/pronation AROM elbow in at side x 12 Supine  Scap squeeze shoulder extension isometric 3 sec x 10  Standing   L shoulder isometric abduction 10 sec x 10  L shoulder ER isometric 10 sec x 10  L shoulder IR isometric 10 sec x 10  Manual Therapy: STM L shoulder girdle tightness noted in upper traps; supraspinatus; leveator; teres; biceps; deltoid  PROM L shoulder into flexion; scaption; ER in scaption no pain    PATIENT EDUCATION:  Education details: poC; HEP Person educated: Patient Education method: Explanation, Demonstration, Tactile cues, Verbal cues, and Handouts Education comprehension: verbalized understanding, returned demonstration, verbal cues required, tactile cues required, and needs further education  HOME EXERCISE PROGRAM: Access Code: [email or web address] URL: [email or web address] Date: 06/05/2023 Prepared by: Corlis Leak  Exercises - Supine Cervical Retraction with Towel  - 2 x daily - 7 x weekly - 1 sets - 5-10 reps - 10 sec  hold - Supine Scapular Retraction  - 2 x daily - 7 x weekly - 1 sets -  10 reps - 5-10 sec  hold - Supine Shoulder Flexion AAROM with Hands Clasped  - 2 x daily - 7 x weekly - 1 sets - 3-5 reps - 10 sec  hold - Anterior Shoulder and Biceps Stretch  - 2 x daily - 7 x weekly - 1 sets - 3 reps - 30 sec  hold - Forearm Supination with Dumbbell  - 1 x daily - 7 x weekly - 3 sets - 10 reps - Seated Elbow Flexion with Self-Anchored Resistance  - 1 x daily - 7 x weekly - 3 sets - 10 reps - Standing Bicep Stretch at Wall  - 1 x daily - 7 x weekly - 1 sets - 3-5 reps - 20-30 sec hold - Supine Scapular Retraction  - 2 x daily - 7 x weekly - 1 sets - 10 reps - 5-10 sec  hold -  Isometric Shoulder Abduction at Wall  - 2 x daily - 7 x weekly - 1 sets - 5-10 reps - 5 sec  hold - Isometric Shoulder External Rotation at Wall  - 2 x daily - 7 x weekly - 1 sets - 5 reps - 5 sec  hold - Standing Isometric Shoulder Internal Rotation with Towel Roll at Doorway  - 2 x daily - 7 x weekly - 1 sets - 5 reps - 5 sec  hold - Isometric Shoulder Extension at Wall  - 2 x daily - 7 x weekly - 1 sets - 5-10 reps - 5 sec  hold  ASSESSMENT:  CLINICAL IMPRESSION: [employer] reports some improvement in L shoulder with current exercises. She has some persistent pain in the L shoulder with elevation of the L UE. She has increased functional activity tolerance at home but she has persistent pain with AROM in forward flexion and scaption. Note L shoulder girdle tightness in upper traps; supraspinatus; leveator; teres; biceps; deltoid. No pain with PROM L shoulder which would indicate muscle tendon pathology; likely rotator cuff. Continued focus on postural strengthening and isometric strengthening today. Encouraged pt to continue consistent HEP. Good tolerance to scapular strengthening including isometrics.   EVAL: Patient is a 82 y.o. female who was seen today for physical therapy evaluation and treatment for cervicalgia and L UE pain. She has negative cervical exam; pain with AROM L shoulder; pain with PROM L  shoulder; pain with resistive testing L shoulder; pain with palpation anterior L shoulder through area of biceps tendon, infraspinatus, teres. She will benefit from PT to address problems identified.   OBJECTIVE IMPAIRMENTS: decreased activity tolerance, decreased mobility, decreased ROM, decreased strength, hypomobility, increased fascial restrictions, increased muscle spasms, impaired UE functional use, improper body mechanics, postural dysfunction, and pain.    GOALS: Goals reviewed with patient? Yes  SHORT TERM GOALS: Target date: 07/03/2023  Independent in initial HEP  Baseline:  Goal status: INITIAL  2.  Decrease L shoulder pain by 25-50% allowing patient to use L UE for light functional activities at home  Baseline:  Goal status: INITIAL   LONG TERM GOALS: Target date: 08/14/2023  Improve posture and alignment with patient to demonstrate improved posture with posterior shoulder girdle musculature engaged  Baseline:  Goal status: INITIAL  2.  Increase AROM L shoulder to =/> than AROM R shoulder  Baseline:  Goal status: INITIAL  3.  4/5 to 5/5 strength L shoulder  Baseline:  Goal status: INITIAL  4.  Decreased pain L shoulder by 70-100% allowing patient to return to normal functional activities including yard work and household chores  Baseline:  Goal status: INITIAL  5.  Independent in HEP including aquatic therapy as indicated  Baseline:  Goal status: INITIAL  6.  Improve functional limitation score to 61 Baseline: 47 Goal status: INITIAL   PLAN:  PT FREQUENCY: 2x/week  PT DURATION: 12 weeks  PLANNED INTERVENTIONS: Therapeutic exercises, Therapeutic activity, Neuromuscular re-education, Balance training, Gait training, Patient/Family education, Self Care, Joint mobilization, Aquatic Therapy, Dry Needling, Spinal mobilization, Cryotherapy, Moist heat, Taping, Ultrasound, Ionotophoresis 4mg /ml Dexamethasone, Manual therapy, and Re-evaluation  PLAN FOR NEXT  SESSION: update HEP, continue with postural correction and education as well as spine care education; manual work, DN, modalities as indicated    [employer], PT 06/05/2023, 2:52 PM

## 2023-06-06 ENCOUNTER — Telehealth: Payer: Self-pay | Admitting: Pulmonary Disease

## 2023-06-06 ENCOUNTER — Other Ambulatory Visit (INDEPENDENT_AMBULATORY_CARE_PROVIDER_SITE_OTHER): Payer: PPO

## 2023-06-06 ENCOUNTER — Ambulatory Visit (INDEPENDENT_AMBULATORY_CARE_PROVIDER_SITE_OTHER): Payer: PPO | Admitting: Sports Medicine

## 2023-06-06 ENCOUNTER — Encounter: Payer: Self-pay | Admitting: Sports Medicine

## 2023-06-06 ENCOUNTER — Ambulatory Visit (INDEPENDENT_AMBULATORY_CARE_PROVIDER_SITE_OTHER): Payer: PPO

## 2023-06-06 DIAGNOSIS — M25512 Pain in left shoulder: Secondary | ICD-10-CM | POA: Diagnosis not present

## 2023-06-06 DIAGNOSIS — G8929 Other chronic pain: Secondary | ICD-10-CM

## 2023-06-06 NOTE — Progress Notes (Signed)
    Procedures performed today:    Procedure: Real-time Ultrasound Guided injection of the left subacromial bursa Device: Samsung HS60  Verbal informed consent obtained.  Time-out conducted.  Noted no overlying erythema, induration, or other signs of local infection.  Skin prepped in a sterile fashion.  Local anesthesia: Topical Ethyl chloride.  With sterile technique and under real time ultrasound guidance: Noted cuff tearing, 1 cc kenalog 40, 1 cc lidocaine, 1 cc bupivacaine injected easily.   Completed without difficulty  Advised to call if fevers/chills, erythema, induration, drainage, or persistent bleeding.  Images permanently stored and available for review in PACS.  Impression: Technically successful ultrasound guided injection.  Procedure: Real-time Ultrasound Guided injection of the left proximal bicep sheath Device: Samsung HS60  Verbal informed consent obtained.  Time-out conducted.  Noted no overlying erythema, induration, or other signs of local infection.  Skin prepped in a sterile fashion.  Local anesthesia: Topical Ethyl chloride.  With sterile technique and under real time ultrasound guidance: Noted some biceps split tearing, 1 cc kenalog 40, 1 cc lidocaine, 1 cc bupivacaine injected easily. Completed without difficulty  Advised to call if fevers/chills, erythema, induration, drainage, or persistent bleeding.  Images permanently stored and available for review in PACS.  Impression: Technically successful ultrasound guided injection.  Independent interpretation of notes and tests performed by another provider:   None.  Brief History, Exam, Impression, and Recommendations:    Left shoulder pain Pleasant 82 year old female, she recalls picking up a suitcase and then having severe pain neck and shoulder. She has had cervical spine x-rays, prednisone, she had physical therapy directed at her neck, she no longer has neck pain but she does endorse pain over the  deltoid and mid humerus left shoulder. On exam she does have positive impingement signs, weakness to abduction, she has a positive speeds and positive Yergason test. I do suspect she has rotator cuff tearing as well as a proximal biceps pain generator. I want to update her physical therapy orders to focus more on her shoulder, we will inject her biceps sheath and her subacromial bursa. I would like shoulder x-rays. Return to see me in 6 weeks.    ____________________________________________ Ihor Austin. Benjamin Stain, M.D., ABFM., CAQSM., AME. Primary Care and Sports Medicine [facility]  Adjunct Professor of Family Medicine  [employer]  Restaurant manager, fast food

## 2023-06-06 NOTE — Telephone Encounter (Signed)
Rx was refilled  Pt aware  Nothing further needed 

## 2023-06-06 NOTE — Telephone Encounter (Signed)
PT now calling for her Trellegy refill since two requests from [facility] have not been addressed.    [facility] on [address]  She is out and badly  needs it.

## 2023-06-06 NOTE — Assessment & Plan Note (Signed)
Pleasant 82 year old female, she recalls picking up a suitcase and then having severe pain neck and shoulder. She has had cervical spine x-rays, prednisone, she had physical therapy directed at her neck, she no longer has neck pain but she does endorse pain over the deltoid and mid humerus left shoulder. On exam she does have positive impingement signs, weakness to abduction, she has a positive speeds and positive Yergason test. I do suspect she has rotator cuff tearing as well as a proximal biceps pain generator. I want to update her physical therapy orders to focus more on her shoulder, we will inject her biceps sheath and her subacromial bursa. I would like shoulder x-rays. Return to see me in 6 weeks.

## 2023-06-12 ENCOUNTER — Ambulatory Visit: Payer: PPO | Attending: Sports Medicine | Admitting: Rehabilitative and Restorative Service Providers"

## 2023-06-12 ENCOUNTER — Encounter: Payer: Self-pay | Admitting: Rehabilitative and Restorative Service Providers"

## 2023-06-12 DIAGNOSIS — M6281 Muscle weakness (generalized): Secondary | ICD-10-CM | POA: Insufficient documentation

## 2023-06-12 DIAGNOSIS — M25551 Pain in right hip: Secondary | ICD-10-CM | POA: Diagnosis present

## 2023-06-12 DIAGNOSIS — M25512 Pain in left shoulder: Secondary | ICD-10-CM | POA: Insufficient documentation

## 2023-06-12 DIAGNOSIS — R29898 Other symptoms and signs involving the musculoskeletal system: Secondary | ICD-10-CM | POA: Insufficient documentation

## 2023-06-12 DIAGNOSIS — M5412 Radiculopathy, cervical region: Secondary | ICD-10-CM | POA: Diagnosis present

## 2023-06-12 NOTE — Therapy (Signed)
OUTPATIENT PHYSICAL THERAPY CERVICAL TREATMENT   Patient Name: Christine Barry MRN: [id number] DOB:1941-09-08, 82 y.o., female Today's Date: 06/12/2023  END OF SESSION:  PT End of Session - 06/12/23 1453     Visit Number 6    Date for PT Re-Evaluation 08/14/23    Authorization Type [employer]    Authorization - Visit Number 6    Progress Note Due on Visit 10    PT Start Time 1445    PT Stop Time 1530    PT Time Calculation (min) 45 min    Activity Tolerance Patient tolerated treatment well              Past Medical History:  Diagnosis Date   Allergy 1964   Anxiety 1986   Arthritis    Asthma    COPD (chronic obstructive pulmonary disease) (HCC)    Emphysema of lung (HCC) 2016   Hyperlipidemia    Thyroid disease    Past Surgical History:  Procedure Laterality Date   CESAREAN SECTION     EYE SURGERY  2018   Cataracts   Patient Active Problem List   Diagnosis Date Noted   Left shoulder pain 06/06/2023   Weight gain 06/08/2022   Grief at loss of child 06/08/2022   Postoperative hypothyroidism 03/08/2021   Centrilobular emphysema (HCC) 03/08/2021   Moderate persistent asthma without complication 03/08/2021   Osteoporosis screening 03/08/2021   Right hip pain 08/23/2020   Anxiety 07/23/2019   Diverticulosis 07/23/2019   Elevated cholesterol 07/23/2019   History of nicotine dependence 07/23/2019   Hyperplastic colon polyp 07/23/2019   Increased glucose level 07/23/2019   Osteoarthritis 07/23/2019   Redundant colon 07/23/2019   Seasonal allergic rhinitis 07/23/2019   Sensorineural hearing loss (SNHL) of both ears 07/23/2019   Vitamin D deficiency 07/23/2019   Xerosis of skin 07/23/2019   Chronic obstructive pulmonary disease (HCC) 07/23/2019   Pseudoptosis 09/16/2013   Dermatochalasis 09/16/2013    PCP: Christen Butter, NP  REFERRING PROVIDER: Christen Butter, NP  REFERRING DIAG: cervicalgia  THERAPY DIAG:  Cervical radiculopathy  Acute pain  of left shoulder  Muscle weakness (generalized)  Other symptoms and signs involving the musculoskeletal system  Rationale for Evaluation and Treatment: Rehabilitation  ONSET DATE: 04/28/23  SUBJECTIVE:                                                                                                                                                                                                         SUBJECTIVE STATEMENT: Christine Barry saw Dr and did receive two injections in  the L shoulder. She did not receive the relief that she has hoped. She has persistent pain in the L shoulder area. Pt states she has noticed some improvement in the L shoulder with injections and exercises. She is avoiding activities that irritate the shoulder. She has more time with less pain, noticed when pulling garment off over her head.  She is still awakening during the night but can return to sleep without medication.  Hand dominance: Right  PERTINENT HISTORY:  R hip pain; arthritis   PAIN:  Are you having pain? Yes: NPRS scale: 3/10 at rest 5/10 with lifting arm  Pain location: L shoulder to middle of the L arm  Pain description: sharp Aggravating factors: lifting arm; reaching down;  Relieving factors: not moving arm; no change with prednisone   PRECAUTIONS: None    WEIGHT BEARING RESTRICTIONS: No  FALLS:  Has patient fallen in last 6 months? 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 She does some household chores and cooking; yard work; yoga 1x/wk at [employer]; church; reading; on line games   PATIENT GOALS: get rid of the L arm and use the arm normally   NEXT MD VISIT: 08/12/23  OBJECTIVE:   DIAGNOSTIC FINDINGS:  Cervical xray 05/20/23:  Severe multilevel spondylosis and facet hypertrophy, most pronounced at the C5-6 level as  above. No acute displaced fracture  PATIENT SURVEYS:  FOTO 47; goal 61  SENSATION: WFL  POSTURE: Patient presents with head forward posture with increased thoracic kyphosis; shoulders rounded and elevated; scapulae abducted and rotated along the thoracic spine; head of the humerus anterior in orientation.   PALPATION: Pain with palpation L shoulder anterior shoulder at biceps tendon area; posterior shoulder at infraspinatus/teres area    CERVICAL ROM:   Active ROM A/PROM (deg) eval  Flexion 47  Extension 46  Right lateral flexion 28  Left lateral flexion 32  Right rotation 62  Left rotation 59   (Blank rows = not tested)  UPPER EXTREMITY ROM:  Active ROM Right eval Left eval  Shoulder flexion 147 66 pain L arm   Shoulder extension 67 pull 55 tight  Shoulder abduction 158 56 pain L arm   Shoulder adduction    Shoulder extension 57 arm at side  55 at side pull   Shoulder internal rotation Thumb T7 Thumb T10 pull L arm   Shoulder external rotation    Elbow flexion    Elbow extension    Wrist flexion    Wrist extension    Wrist ulnar deviation    Wrist radial deviation    Wrist pronation    Wrist supination     (Blank rows = not tested)  UPPER EXTREMITY MMT:  MMT Right eval Left eval  Shoulder flexion 5 3- pain  Shoulder extension 5 4 pain  Shoulder abduction 5 3- pain  Shoulder adduction    Shoulder extension 5 4  Shoulder internal rotation 5 4 pain  Shoulder external rotation 5 4+  Middle trapezius 4 3  Lower trapezius 4 3  Elbow flexion    Elbow extension    Wrist flexion    Wrist extension    Wrist ulnar deviation    Wrist radial deviation    Wrist pronation    Wrist supination    Grip strength     (Blank rows = not tested)  CERVICAL SPECIAL TESTS:  Spurling's test: Negative, and Distraction test: Negative   [facility] Adult PT Treatment:                                                DATE: 06/12/23 Therapeutic Exercise: Sitting Shoulder flexion  AROM to tolerance x 4 reps  Scap squeezes with noodle 10 sec x 10 "L"'s with noodle x 10 "W"s with noodle x 10 Lt supination/pronation AROM elbow in at side x 12 Supine  Scap squeeze shoulder extension isometric 3 sec x 10  Standing  L shoulder isometric abduction 10 sec x 10  L shoulder ER isometric 10 sec x 10  L shoulder IR isometric 10 sec x 10  Manual Therapy: STM L shoulder girdle tightness noted in upper traps; supraspinatus; leveator; teres; biceps; deltoid  PROM L shoulder into flexion; scaption; ER in scaption no pain  Skilled palpation to assess response to manual work and DN Trigger Point Dry-Needling  Treatment instructions: Expect mild to moderate muscle soreness. S/S of pneumothorax if dry needled over a lung field, and to seek immediate medical attention should they occur. Patient verbalized understanding of these instructions and education.  Patient Consent Given: Yes Education handout provided: Yes Muscles treated: L upper trap; pecs Electrical stimulation performed: No Parameters: N/A Treatment response/outcome: decreased palpable tightness    [facility] Adult PT Treatment:                                                DATE: 06/05/23 Therapeutic Exercise: Sitting Shoulder flexion AROM to tolerance x 4 reps  Scap squeezes with noodle 10 sec x 10 "L"'s with noodle x 10 "W"s with noodle x 10 Lt supination/pronation AROM elbow in at side x 12 Supine  Scap squeeze shoulder extension isometric 3 sec x 10  Standing  L shoulder isometric abduction 10 sec x 10  L shoulder ER isometric 10 sec x 10  L shoulder IR isometric 10 sec x 10  Manual Therapy: STM L shoulder girdle tightness noted in upper traps; supraspinatus; leveator; teres; biceps; deltoid  PROM L shoulder into flexion; scaption; ER in scaption no pain   [facility] Adult PT Treatment:                                                DATE: 06/03/23 Therapeutic Exercise: Sitting Shoulder flexion AROM to tolerance x  4 reps  Scap squeezes x 12 "L"'s x 12 "W"s x 12 Lt supination/pronation AROM elbow in at side x 12 Supine  Scap squeeze shoulder extension isometric 3 sec x 10  Standing   L shoulder isometric abduction 10 sec x 10  L shoulder ER isometric 10 sec x 10  L shoulder IR isometric 10 sec x 10  Manual Therapy: STM L shoulder girdle tightness noted in upper traps; supraspinatus; leveator; teres; biceps; deltoid  PROM L shoulder into flexion; scaption; ER in scaption no pain    PATIENT EDUCATION:  Education details: poC; HEP Person educated: Patient Education method: Explanation, Demonstration, Tactile cues, Verbal cues, and Handouts Education comprehension: verbalized understanding, returned  demonstration, verbal cues required, tactile cues required, and needs further education  HOME EXERCISE PROGRAM: Access Code: [email or web address] URL: [email or web address] Date: 06/05/2023 Prepared by: Corlis Leak  Exercises - Supine Cervical Retraction with Towel  - 2 x daily - 7 x weekly - 1 sets - 5-10 reps - 10 sec  hold - Supine Scapular Retraction  - 2 x daily - 7 x weekly - 1 sets - 10 reps - 5-10 sec  hold - Supine Shoulder Flexion AAROM with Hands Clasped  - 2 x daily - 7 x weekly - 1 sets - 3-5 reps - 10 sec  hold - Anterior Shoulder and Biceps Stretch  - 2 x daily - 7 x weekly - 1 sets - 3 reps - 30 sec  hold - Forearm Supination with Dumbbell  - 1 x daily - 7 x weekly - 3 sets - 10 reps - Seated Elbow Flexion with Self-Anchored Resistance  - 1 x daily - 7 x weekly - 3 sets - 10 reps - Standing Bicep Stretch at Wall  - 1 x daily - 7 x weekly - 1 sets - 3-5 reps - 20-30 sec hold - Supine Scapular Retraction  - 2 x daily - 7 x weekly - 1 sets - 10 reps - 5-10 sec  hold - Isometric Shoulder Abduction at Wall  - 2 x daily - 7 x weekly - 1 sets - 5-10 reps - 5 sec  hold - Isometric Shoulder External Rotation at Wall  - 2 x daily - 7 x weekly - 1 sets - 5 reps - 5 sec  hold - Standing  Isometric Shoulder Internal Rotation with Towel Roll at Doorway  - 2 x daily - 7 x weekly - 1 sets - 5 reps - 5 sec  hold - Isometric Shoulder Extension at Wall  - 2 x daily - 7 x weekly - 1 sets - 5-10 reps - 5 sec  hold  ASSESSMENT:  CLINICAL IMPRESSION: Inocencio Homes reports some improvement in L shoulder with injections and exercises. She has some persistent pain and tightness in the L upper trap area of L shoulder with elevation of the L UE. She has compensatory movements in L shoulder girdle with active movement of L UE. Good response to DN and manual work through the upper traps and pecs. Will continue with DN for R shoulder girdle. Reviewed exercises and cautioned patient to avoid pain with HEP    06/05/23: She has increased functional activity tolerance at home but she has persistent pain with AROM in forward flexion and scaption. Note L shoulder girdle tightness in upper traps; supraspinatus; leveator; teres; biceps; deltoid. No pain with PROM L shoulder which would indicate muscle tendon pathology; likely rotator cuff. Continued focus on postural strengthening and isometric strengthening today. Encouraged pt to continue consistent HEP. Good tolerance to scapular strengthening including isometrics.   EVAL: Patient is a 82 y.o. female who was seen today for physical therapy evaluation and treatment for cervicalgia and L UE pain. She has negative cervical exam; pain with AROM L shoulder; pain with PROM L shoulder; pain with resistive testing L shoulder; pain with palpation anterior L shoulder through area of biceps tendon, infraspinatus, teres. She will benefit from PT to address problems identified.   OBJECTIVE IMPAIRMENTS: decreased activity tolerance, decreased mobility, decreased ROM, decreased strength, hypomobility, increased fascial restrictions, increased muscle spasms, impaired UE functional use, improper body mechanics, postural dysfunction, and pain.    GOALS: Goals reviewed with patient?  Yes  SHORT TERM GOALS: Target date: 07/03/2023  Independent in initial HEP  Baseline:  Goal status: INITIAL  2.  Decrease L shoulder pain by 25-50% allowing patient to use L UE for light functional activities at home  Baseline:  Goal status: INITIAL   LONG TERM GOALS: Target date: 08/14/2023  Improve posture and alignment with patient to demonstrate improved posture with posterior shoulder girdle musculature engaged  Baseline:  Goal status: INITIAL  2.  Increase AROM L shoulder to =/> than AROM R shoulder  Baseline:  Goal status: INITIAL  3.  4/5 to 5/5 strength L shoulder  Baseline:  Goal status: INITIAL  4.  Decreased pain L shoulder by 70-100% allowing patient to return to normal functional activities including yard work and household chores  Baseline:  Goal status: INITIAL  5.  Independent in HEP including aquatic therapy as indicated  Baseline:  Goal status: INITIAL  6.  Improve functional limitation score to 61 Baseline: 47 Goal status: INITIAL   PLAN:  PT FREQUENCY: 2x/week  PT DURATION: 12 weeks  PLANNED INTERVENTIONS: Therapeutic exercises, Therapeutic activity, Neuromuscular re-education, Balance training, Gait training, Patient/Family education, Self Care, Joint mobilization, Aquatic Therapy, Dry Needling, Spinal mobilization, Cryotherapy, Moist heat, Taping, Ultrasound, Ionotophoresis 4mg /ml Dexamethasone, Manual therapy, and Re-evaluation  PLAN FOR NEXT SESSION: update HEP, continue with postural correction and education as well as spine care education; manual work, DN, modalities as indicated    [employer], PT 06/12/2023, 2:56 PM

## 2023-06-14 ENCOUNTER — Ambulatory Visit: Payer: PPO | Admitting: Physical Therapy

## 2023-06-14 ENCOUNTER — Encounter: Payer: Self-pay | Admitting: Physical Therapy

## 2023-06-14 DIAGNOSIS — M25512 Pain in left shoulder: Secondary | ICD-10-CM

## 2023-06-14 DIAGNOSIS — M6281 Muscle weakness (generalized): Secondary | ICD-10-CM

## 2023-06-14 DIAGNOSIS — M5412 Radiculopathy, cervical region: Secondary | ICD-10-CM | POA: Diagnosis not present

## 2023-06-14 NOTE — Therapy (Signed)
OUTPATIENT PHYSICAL THERAPY CERVICAL TREATMENT   Patient Name: Christine Barry MRN: [id number] DOB:21-Sep-1941, 82 y.o., female Today's Date: 06/14/2023  END OF SESSION:  PT End of Session - 06/14/23 1141     Visit Number 7    Number of Visits 24    Date for PT Re-Evaluation 08/14/23    Authorization Type [employer]    Authorization - Visit Number 7    Progress Note Due on Visit 10    PT Start Time 1100    PT Stop Time 1141    PT Time Calculation (min) 41 min    Activity Tolerance Patient tolerated treatment well    Behavior During Therapy WFL for tasks assessed/performed               Past Medical History:  Diagnosis Date   Allergy 1964   Anxiety 1986   Arthritis    Asthma    COPD (chronic obstructive pulmonary disease) (HCC)    Emphysema of lung (HCC) 2016   Hyperlipidemia    Thyroid disease    Past Surgical History:  Procedure Laterality Date   CESAREAN SECTION     EYE SURGERY  2018   Cataracts   Patient Active Problem List   Diagnosis Date Noted   Left shoulder pain 06/06/2023   Weight gain 06/08/2022   Grief at loss of child 06/08/2022   Postoperative hypothyroidism 03/08/2021   Centrilobular emphysema (HCC) 03/08/2021   Moderate persistent asthma without complication 03/08/2021   Osteoporosis screening 03/08/2021   Right hip pain 08/23/2020   Anxiety 07/23/2019   Diverticulosis 07/23/2019   Elevated cholesterol 07/23/2019   History of nicotine dependence 07/23/2019   Hyperplastic colon polyp 07/23/2019   Increased glucose level 07/23/2019   Osteoarthritis 07/23/2019   Redundant colon 07/23/2019   Seasonal allergic rhinitis 07/23/2019   Sensorineural hearing loss (SNHL) of both ears 07/23/2019   Vitamin D deficiency 07/23/2019   Xerosis of skin 07/23/2019   Chronic obstructive pulmonary disease (HCC) 07/23/2019   Pseudoptosis 09/16/2013   Dermatochalasis 09/16/2013    PCP: Christen Butter, NP  REFERRING PROVIDER: Christen Butter,  NP  REFERRING DIAG: cervicalgia  THERAPY DIAG:  Cervical radiculopathy  Acute pain of left shoulder  Muscle weakness (generalized)  Rationale for Evaluation and Treatment: Rehabilitation  ONSET DATE: 04/28/23  SUBJECTIVE:                                                                                                                                                                                                         SUBJECTIVE STATEMENT:  Pt reports she is feeling better after the dry needling. She states she has been performing some of the isometric exercises at home Hand dominance: Right  PERTINENT HISTORY:  R hip pain; arthritis   PAIN:  Are you having pain? Yes: NPRS scale: 2/10 at rest 5/10 with lifting arm  Pain location: L shoulder to middle of the L arm  Pain description: sharp Aggravating factors: lifting arm; reaching down;  Relieving factors: not moving arm; no change with prednisone   PRECAUTIONS: None    WEIGHT BEARING RESTRICTIONS: No  FALLS:  Has patient fallen in last 6 months? 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 She does some household chores and cooking; yard work; yoga 1x/wk at [employer]; church; reading; on line games   PATIENT GOALS: get rid of the L arm and use the arm normally   NEXT MD VISIT: 08/12/23  OBJECTIVE:   DIAGNOSTIC FINDINGS:  Cervical xray 05/20/23:  Severe multilevel spondylosis and facet hypertrophy, most pronounced at the C5-6 level as above. No acute displaced fracture  PATIENT SURVEYS:  FOTO 47; goal 61  SENSATION: WFL  POSTURE: Patient presents with head forward posture with increased thoracic kyphosis; shoulders rounded and elevated; scapulae abducted and rotated along the thoracic spine; head of the humerus anterior in orientation.    PALPATION: Pain with palpation L shoulder anterior shoulder at biceps tendon area; posterior shoulder at infraspinatus/teres area    CERVICAL ROM:   Active ROM A/PROM (deg) eval  Flexion 47  Extension 46  Right lateral flexion 28  Left lateral flexion 32  Right rotation 62  Left rotation 59   (Blank rows = not tested)  UPPER EXTREMITY ROM:  Active ROM Right eval Left eval  Shoulder flexion 147 66 pain L arm   Shoulder extension 67 pull 55 tight  Shoulder abduction 158 56 pain L arm   Shoulder adduction    Shoulder extension 57 arm at side  55 at side pull   Shoulder internal rotation Thumb T7 Thumb T10 pull L arm   Shoulder external rotation    Elbow flexion    Elbow extension    Wrist flexion    Wrist extension    Wrist ulnar deviation    Wrist radial deviation    Wrist pronation    Wrist supination     (Blank rows = not tested)  UPPER EXTREMITY MMT:  MMT Right eval Left eval  Shoulder flexion 5 3- pain  Shoulder extension 5 4 pain  Shoulder abduction 5 3- pain  Shoulder adduction    Shoulder extension 5 4  Shoulder internal rotation 5 4 pain  Shoulder external rotation 5 4+  Middle trapezius 4 3  Lower trapezius 4 3  Elbow flexion    Elbow extension    Wrist flexion    Wrist extension    Wrist ulnar deviation    Wrist radial deviation    Wrist pronation    Wrist supination    Grip strength     (Blank rows = not tested)  CERVICAL SPECIAL TESTS:  Spurling's test: Negative, and Distraction test: Negative   [facility] Adult PT Treatment:  DATE: 06/14/23 Therapeutic Exercise: Pulley flexion x 2 minutes Seated with noodle behind back: scap squeeze 10 sec hold x 10, L's x 10, W' x 10 Lt shoulder isometrics: 10 x 10 sec hold abd, ER, IR Manual Therapy: STM Lt UT, pecs PROM Lt shoulder into flexion Trigger Point Dry-Needling  Treatment instructions: Expect mild to moderate muscle soreness. S/S of  pneumothorax if dry needled over a lung field, and to seek immediate medical attention should they occur. Patient verbalized understanding of these instructions and education.  Patient Consent Given: Yes Education handout provided: Previously provided Muscles treated: Lt Upper trap Electrical stimulation performed: No Parameters: N/A Treatment response/outcome: palpable increase in muscle length   [facility] Adult PT Treatment:                                                DATE: 06/12/23 Therapeutic Exercise: Sitting Shoulder flexion AROM to tolerance x 4 reps  Scap squeezes with noodle 10 sec x 10 "L"'s with noodle x 10 "W"s with noodle x 10 Lt supination/pronation AROM elbow in at side x 12 Supine  Scap squeeze shoulder extension isometric 3 sec x 10  Standing  L shoulder isometric abduction 10 sec x 10  L shoulder ER isometric 10 sec x 10  L shoulder IR isometric 10 sec x 10  Manual Therapy: STM L shoulder girdle tightness noted in upper traps; supraspinatus; leveator; teres; biceps; deltoid  PROM L shoulder into flexion; scaption; ER in scaption no pain  Skilled palpation to assess response to manual work and DN Trigger Point Dry-Needling  Treatment instructions: Expect mild to moderate muscle soreness. S/S of pneumothorax if dry needled over a lung field, and to seek immediate medical attention should they occur. Patient verbalized understanding of these instructions and education.  Patient Consent Given: Yes Education handout provided: Yes Muscles treated: L upper trap; pecs Electrical stimulation performed: No Parameters: N/A Treatment response/outcome: decreased palpable tightness    [facility] Adult PT Treatment:                                                DATE: 06/05/23 Therapeutic Exercise: Sitting Shoulder flexion AROM to tolerance x 4 reps  Scap squeezes with noodle 10 sec x 10 "L"'s with noodle x 10 "W"s with noodle x 10 Lt supination/pronation AROM elbow in at side x  12 Supine  Scap squeeze shoulder extension isometric 3 sec x 10  Standing  L shoulder isometric abduction 10 sec x 10  L shoulder ER isometric 10 sec x 10  L shoulder IR isometric 10 sec x 10  Manual Therapy: STM L shoulder girdle tightness noted in upper traps; supraspinatus; leveator; teres; biceps; deltoid  PROM L shoulder into flexion; scaption; ER in scaption no pain    PATIENT EDUCATION:  Education details: poC; HEP Person educated: Patient Education method: Explanation, Demonstration, Tactile cues, Verbal cues, and Handouts Education comprehension: verbalized understanding, returned demonstration, verbal cues required, tactile cues required, and needs further education  HOME EXERCISE PROGRAM: Access Code: [email or web address] URL: [email or web address] Date: 06/05/2023 Prepared by: Corlis Leak  Exercises - Supine Cervical Retraction with Towel  - 2 x daily - 7 x weekly - 1 sets - 5-10 reps - 10  sec  hold - Supine Scapular Retraction  - 2 x daily - 7 x weekly - 1 sets - 10 reps - 5-10 sec  hold - Supine Shoulder Flexion AAROM with Hands Clasped  - 2 x daily - 7 x weekly - 1 sets - 3-5 reps - 10 sec  hold - Anterior Shoulder and Biceps Stretch  - 2 x daily - 7 x weekly - 1 sets - 3 reps - 30 sec  hold - Forearm Supination with Dumbbell  - 1 x daily - 7 x weekly - 3 sets - 10 reps - Seated Elbow Flexion with Self-Anchored Resistance  - 1 x daily - 7 x weekly - 3 sets - 10 reps - Standing Bicep Stretch at Wall  - 1 x daily - 7 x weekly - 1 sets - 3-5 reps - 20-30 sec hold - Supine Scapular Retraction  - 2 x daily - 7 x weekly - 1 sets - 10 reps - 5-10 sec  hold - Isometric Shoulder Abduction at Wall  - 2 x daily - 7 x weekly - 1 sets - 5-10 reps - 5 sec  hold - Isometric Shoulder External Rotation at Wall  - 2 x daily - 7 x weekly - 1 sets - 5 reps - 5 sec  hold - Standing Isometric Shoulder Internal Rotation with Towel Roll at Doorway  - 2 x daily - 7 x weekly - 1 sets - 5  reps - 5 sec  hold - Isometric Shoulder Extension at Wall  - 2 x daily - 7 x weekly - 1 sets - 5-10 reps - 5 sec  hold  ASSESSMENT:  CLINICAL IMPRESSION: Continued with dry needling as pt with good response after last treatment. Pt requires cues for technique with isometrics to reduce compensation. She is progressing well with POC   06/05/23: She has increased functional activity tolerance at home but she has persistent pain with AROM in forward flexion and scaption. Note L shoulder girdle tightness in upper traps; supraspinatus; leveator; teres; biceps; deltoid. No pain with PROM L shoulder which would indicate muscle tendon pathology; likely rotator cuff. Continued focus on postural strengthening and isometric strengthening today. Encouraged pt to continue consistent HEP. Good tolerance to scapular strengthening including isometrics.   EVAL: Patient is a 82 y.o. female who was seen today for physical therapy evaluation and treatment for cervicalgia and L UE pain. She has negative cervical exam; pain with AROM L shoulder; pain with PROM L shoulder; pain with resistive testing L shoulder; pain with palpation anterior L shoulder through area of biceps tendon, infraspinatus, teres. She will benefit from PT to address problems identified.   OBJECTIVE IMPAIRMENTS: decreased activity tolerance, decreased mobility, decreased ROM, decreased strength, hypomobility, increased fascial restrictions, increased muscle spasms, impaired UE functional use, improper body mechanics, postural dysfunction, and pain.    GOALS: Goals reviewed with patient? Yes  SHORT TERM GOALS: Target date: 07/03/2023  Independent in initial HEP  Baseline:  Goal status: INITIAL  2.  Decrease L shoulder pain by 25-50% allowing patient to use L UE for light functional activities at home  Baseline:  Goal status: INITIAL   LONG TERM GOALS: Target date: 08/14/2023  Improve posture and alignment with patient to demonstrate  improved posture with posterior shoulder girdle musculature engaged  Baseline:  Goal status: INITIAL  2.  Increase AROM L shoulder to =/> than AROM R shoulder  Baseline:  Goal status: INITIAL  3.  4/5 to 5/5 strength  L shoulder  Baseline:  Goal status: INITIAL  4.  Decreased pain L shoulder by 70-100% allowing patient to return to normal functional activities including yard work and household chores  Baseline:  Goal status: INITIAL  5.  Independent in HEP including aquatic therapy as indicated  Baseline:  Goal status: INITIAL  6.  Improve functional limitation score to 61 Baseline: 47 Goal status: INITIAL   PLAN:  PT FREQUENCY: 2x/week  PT DURATION: 12 weeks  PLANNED INTERVENTIONS: Therapeutic exercises, Therapeutic activity, Neuromuscular re-education, Balance training, Gait training, Patient/Family education, Self Care, Joint mobilization, Aquatic Therapy, Dry Needling, Spinal mobilization, Cryotherapy, Moist heat, Taping, Ultrasound, Ionotophoresis 4mg /ml Dexamethasone, Manual therapy, and Re-evaluation  PLAN FOR NEXT SESSION: update HEP, continue with postural correction and education as well as spine care education; manual work, DN, modalities as indicated    Lorrain Rivers, PT 06/14/2023, 11:42 AM

## 2023-06-17 ENCOUNTER — Ambulatory Visit: Payer: PPO

## 2023-06-17 DIAGNOSIS — M25512 Pain in left shoulder: Secondary | ICD-10-CM

## 2023-06-17 DIAGNOSIS — R29898 Other symptoms and signs involving the musculoskeletal system: Secondary | ICD-10-CM

## 2023-06-17 DIAGNOSIS — M25551 Pain in right hip: Secondary | ICD-10-CM

## 2023-06-17 DIAGNOSIS — M6281 Muscle weakness (generalized): Secondary | ICD-10-CM

## 2023-06-17 DIAGNOSIS — M5412 Radiculopathy, cervical region: Secondary | ICD-10-CM

## 2023-06-17 NOTE — Therapy (Signed)
OUTPATIENT PHYSICAL THERAPY CERVICAL TREATMENT   Patient Name: Casimera Gerads MRN: [id number] DOB:[birth year], 82 y.o., female Today's Date: 06/17/2023  END OF SESSION:  PT End of Session - 06/17/23 1441     Visit Number 8    Number of Visits 24    Date for PT Re-Evaluation 08/14/23    Authorization Type [employer]    Authorization - Visit Number 8    Progress Note Due on Visit 10    PT Start Time 1445    PT Stop Time 1527    PT Time Calculation (min) 42 min    Behavior During Therapy [facility] for tasks assessed/performed               Past Medical History:  Diagnosis Date   Allergy 1964   Anxiety 1986   Arthritis    Asthma    COPD (chronic obstructive pulmonary disease) (HCC)    Emphysema of lung (HCC) 2016   Hyperlipidemia    Thyroid disease    Past Surgical History:  Procedure Laterality Date   CESAREAN SECTION     EYE SURGERY  2018   Cataracts   Patient Active Problem List   Diagnosis Date Noted   Left shoulder pain 06/06/2023   Weight gain 06/08/2022   Grief at loss of child 06/08/2022   Postoperative hypothyroidism 03/08/2021   Centrilobular emphysema (HCC) 03/08/2021   Moderate persistent asthma without complication 03/08/2021   Osteoporosis screening 03/08/2021   Right hip pain 08/23/2020   Anxiety 07/23/2019   Diverticulosis 07/23/2019   Elevated cholesterol 07/23/2019   History of nicotine dependence 07/23/2019   Hyperplastic colon polyp 07/23/2019   Increased glucose level 07/23/2019   Osteoarthritis 07/23/2019   Redundant colon 07/23/2019   Seasonal allergic rhinitis 07/23/2019   Sensorineural hearing loss (SNHL) of both ears 07/23/2019   Vitamin D deficiency 07/23/2019   Xerosis of skin 07/23/2019   Chronic obstructive pulmonary disease (HCC) 07/23/2019   Pseudoptosis 09/16/2013   Dermatochalasis 09/16/2013    PCP: Christen Butter, NP  REFERRING PROVIDER: Christen Butter, NP  REFERRING DIAG: cervicalgia  THERAPY DIAG:  Other  symptoms and signs involving the musculoskeletal system  Acute pain of left shoulder  Muscle weakness (generalized)  Cervical radiculopathy  Right hip pain  Rationale for Evaluation and Treatment: Rehabilitation  ONSET DATE: 04/28/23  SUBJECTIVE:  SUBJECTIVE STATEMENT: Patient reports no pain but continues to have soreness along L upper arm.  Hand dominance: Right  PERTINENT HISTORY:  R hip pain; arthritis   PAIN:  Are you having pain? Yes: NPRS scale: 2/10 at rest 5/10 with lifting arm  Pain location: L shoulder to middle of the L arm  Pain description: sharp Aggravating factors: lifting arm; reaching down;  Relieving factors: not moving arm; no change with prednisone   PRECAUTIONS: None    WEIGHT BEARING RESTRICTIONS: No  FALLS:  Has patient fallen in last 6 months? 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 She does some household chores and cooking; yard work; yoga 1x/wk at [employer]; church; reading; on line games   PATIENT GOALS: get rid of the L arm and use the arm normally   NEXT MD VISIT: 08/12/23  OBJECTIVE:   DIAGNOSTIC FINDINGS:  Cervical xray 05/20/23:  Severe multilevel spondylosis and facet hypertrophy, most pronounced at the C5-6 level as above. No acute displaced fracture  PATIENT SURVEYS:  FOTO 47; goal 61  SENSATION: WFL  POSTURE: Patient presents with head forward posture with increased thoracic kyphosis; shoulders rounded and elevated; scapulae abducted and rotated along the thoracic spine; head of the humerus anterior in orientation.   PALPATION: Pain with palpation L  shoulder anterior shoulder at biceps tendon area; posterior shoulder at infraspinatus/teres area    CERVICAL ROM:   Active ROM A/PROM (deg) eval  Flexion 47  Extension 46  Right lateral flexion 28  Left lateral flexion 32  Right rotation 62  Left rotation 59   (Blank rows = not tested)  UPPER EXTREMITY ROM:  Active ROM Right eval Left eval  Shoulder flexion 147 66 pain L arm   Shoulder extension 67 pull 55 tight  Shoulder abduction 158 56 pain L arm   Shoulder adduction    Shoulder extension 57 arm at side  55 at side pull   Shoulder internal rotation Thumb T7 Thumb T10 pull L arm   Shoulder external rotation    Elbow flexion    Elbow extension    Wrist flexion    Wrist extension    Wrist ulnar deviation    Wrist radial deviation    Wrist pronation    Wrist supination     (Blank rows = not tested)  UPPER EXTREMITY MMT:  MMT Right eval Left eval  Shoulder flexion 5 3- pain  Shoulder extension 5 4 pain  Shoulder abduction 5 3- pain  Shoulder adduction    Shoulder extension 5 4  Shoulder internal rotation 5 4 pain  Shoulder external rotation 5 4+  Middle trapezius 4 3  Lower trapezius 4 3  Elbow flexion    Elbow extension    Wrist flexion    Wrist extension    Wrist ulnar deviation    Wrist radial deviation    Wrist pronation    Wrist supination    Grip strength     (Blank rows = not tested)  CERVICAL SPECIAL TESTS:  Spurling's test: Negative, and Distraction test: Negative    [facility] Adult PT Treatment:  DATE: 06/17/2023 Therapeutic Exercise: Standing (back against noodle): Scap squeezes 5x5", 5x10" L arms x10 W arms x10 Shoulder isometrics: abd, ER, IR, ext 10x5" each Standing with noodle: bent elbow sup/pron 1#DB Supine scap squeeze shoulder extension iso 2x10x3" Standing: Isometric row step out/in RTB x10 Isometric L shoulder ER YTB x15 Towel slides: flexion, scaption, M/L, circles    [facility]  Adult PT Treatment:                                                DATE: 06/14/23 Therapeutic Exercise: Pulley flexion x 2 minutes Seated with noodle behind back: scap squeeze 10 sec hold x 10, L's x 10, W' x 10 Lt shoulder isometrics: 10 x 10 sec hold abd, ER, IR Manual Therapy: STM Lt UT, pecs PROM Lt shoulder into flexion Trigger Point Dry-Needling  Treatment instructions: Expect mild to moderate muscle soreness. S/S of pneumothorax if dry needled over a lung field, and to seek immediate medical attention should they occur. Patient verbalized understanding of these instructions and education.  Patient Consent Given: Yes Education handout provided: Previously provided Muscles treated: Lt Upper trap Electrical stimulation performed: No Parameters: N/A Treatment response/outcome: palpable increase in muscle length   [facility] Adult PT Treatment:                                                DATE: 06/12/23 Therapeutic Exercise: Sitting Shoulder flexion AROM to tolerance x 4 reps  Scap squeezes with noodle 10 sec x 10 "L"'s with noodle x 10 "W"s with noodle x 10 Lt supination/pronation AROM elbow in at side x 12 Supine  Scap squeeze shoulder extension isometric 3 sec x 10  Standing  L shoulder isometric abduction 10 sec x 10  L shoulder ER isometric 10 sec x 10  L shoulder IR isometric 10 sec x 10  Manual Therapy: STM L shoulder girdle tightness noted in upper traps; supraspinatus; leveator; teres; biceps; deltoid  PROM L shoulder into flexion; scaption; ER in scaption no pain  Skilled palpation to assess response to manual work and DN Trigger Point Dry-Needling  Treatment instructions: Expect mild to moderate muscle soreness. S/S of pneumothorax if dry needled over a lung field, and to seek immediate medical attention should they occur. Patient verbalized understanding of these instructions and education.  Patient Consent Given: Yes Education handout provided: Yes Muscles treated: L  upper trap; pecs Electrical stimulation performed: No Parameters: N/A Treatment response/outcome: decreased palpable tightness     PATIENT EDUCATION:  Education details: POC; HEP Person educated: Patient Education method: Programmer, multimedia, Demonstration, Actor cues, Verbal cues, and Handouts Education comprehension: verbalized understanding, returned demonstration, verbal cues required, tactile cues required, and needs further education  HOME EXERCISE PROGRAM: Access Code: [email or web address] URL: [email or web address] Date: 06/05/2023 Prepared by: Corlis Leak  Exercises - Supine Cervical Retraction with Towel  - 2 x daily - 7 x weekly - 1 sets - 5-10 reps - 10 sec  hold - Supine Scapular Retraction  - 2 x daily - 7 x weekly - 1 sets - 10 reps - 5-10 sec  hold - Supine Shoulder Flexion AAROM with Hands Clasped  - 2 x daily - 7 x weekly - 1 sets - 3-5 reps -  10 sec  hold - Anterior Shoulder and Biceps Stretch  - 2 x daily - 7 x weekly - 1 sets - 3 reps - 30 sec  hold - Forearm Supination with Dumbbell  - 1 x daily - 7 x weekly - 3 sets - 10 reps - Seated Elbow Flexion with Self-Anchored Resistance  - 1 x daily - 7 x weekly - 3 sets - 10 reps - Standing Bicep Stretch at Wall  - 1 x daily - 7 x weekly - 1 sets - 3-5 reps - 20-30 sec hold - Supine Scapular Retraction  - 2 x daily - 7 x weekly - 1 sets - 10 reps - 5-10 sec  hold - Isometric Shoulder Abduction at Wall  - 2 x daily - 7 x weekly - 1 sets - 5-10 reps - 5 sec  hold - Isometric Shoulder External Rotation at Wall  - 2 x daily - 7 x weekly - 1 sets - 5 reps - 5 sec  hold - Standing Isometric Shoulder Internal Rotation with Towel Roll at Doorway  - 2 x daily - 7 x weekly - 1 sets - 5 reps - 5 sec  hold - Isometric Shoulder Extension at Wall  - 2 x daily - 7 x weekly - 1 sets - 5-10 reps - 5 sec  hold  ASSESSMENT:  CLINICAL IMPRESSION: Increased soreness along deltoid area with prolonged isometric activation during shoulder  exercises. Decreasing resistance improved posterior shoulder activation. Cueing required to improve core stability and decrease forward hip stance.    06/05/23: She has increased functional activity tolerance at home but she has persistent pain with AROM in forward flexion and scaption. Note L shoulder girdle tightness in upper traps; supraspinatus; leveator; teres; biceps; deltoid. No pain with PROM L shoulder which would indicate muscle tendon pathology; likely rotator cuff. Continued focus on postural strengthening and isometric strengthening today. Encouraged pt to continue consistent HEP. Good tolerance to scapular strengthening including isometrics.    OBJECTIVE IMPAIRMENTS: decreased activity tolerance, decreased mobility, decreased ROM, decreased strength, hypomobility, increased fascial restrictions, increased muscle spasms, impaired UE functional use, improper body mechanics, postural dysfunction, and pain.    GOALS: Goals reviewed with patient? Yes  SHORT TERM GOALS: Target date: 07/03/2023  Independent in initial HEP  Baseline:  Goal status: INITIAL  2.  Decrease L shoulder pain by 25-50% allowing patient to use L UE for light functional activities at home  Baseline:  Goal status: INITIAL   LONG TERM GOALS: Target date: 08/14/2023  Improve posture and alignment with patient to demonstrate improved posture with posterior shoulder girdle musculature engaged  Baseline:  Goal status: INITIAL  2.  Increase AROM L shoulder to =/> than AROM R shoulder  Baseline:  Goal status: INITIAL  3.  4/5 to 5/5 strength L shoulder  Baseline:  Goal status: INITIAL  4.  Decreased pain L shoulder by 70-100% allowing patient to return to normal functional activities including yard work and household chores  Baseline:  Goal status: INITIAL  5.  Independent in HEP including aquatic therapy as indicated  Baseline:  Goal status: INITIAL  6.  Improve functional limitation score to  61 Baseline: 47 Goal status: INITIAL   PLAN:  PT FREQUENCY: 2x/week  PT DURATION: 12 weeks  PLANNED INTERVENTIONS: Therapeutic exercises, Therapeutic activity, Neuromuscular re-education, Balance training, Gait training, Patient/Family education, Self Care, Joint mobilization, Aquatic Therapy, Dry Needling, Spinal mobilization, Cryotherapy, Moist heat, Taping, Ultrasound, Ionotophoresis 4mg /ml Dexamethasone, Manual therapy, and  Re-evaluation  PLAN FOR NEXT SESSION: update HEP, continue with postural correction and education as well as spine care education; manual work, DN, modalities as indicated    Sanjuana Mae, PTA 06/17/2023, 3:28 PM

## 2023-06-19 ENCOUNTER — Encounter: Payer: Self-pay | Admitting: Rehabilitative and Restorative Service Providers"

## 2023-06-19 ENCOUNTER — Ambulatory Visit: Payer: PPO | Admitting: Rehabilitative and Restorative Service Providers"

## 2023-06-19 DIAGNOSIS — M5412 Radiculopathy, cervical region: Secondary | ICD-10-CM

## 2023-06-19 DIAGNOSIS — M25512 Pain in left shoulder: Secondary | ICD-10-CM

## 2023-06-19 DIAGNOSIS — R29898 Other symptoms and signs involving the musculoskeletal system: Secondary | ICD-10-CM

## 2023-06-19 DIAGNOSIS — M6281 Muscle weakness (generalized): Secondary | ICD-10-CM

## 2023-06-19 NOTE — Therapy (Addendum)
 OUTPATIENT PHYSICAL THERAPY CERVICAL TREATMENT PHYSICAL THERAPY DISCHARGE SUMMARY  Visits from Start of Care: 9  Current functional level related to goals / functional outcomes: See progress note for discharge status    Remaining deficits: Unknown    Education / Equipment: HEP    Patient agrees to discharge. Patient goals were partially met. Patient is being discharged due to not returning since the last visit.  Monicka Cyran P. Leonor Liv PT, MPH 12/17/23 12:32 PM     Patient Name: Christine Barry MRN: [id number] DOB:Jun 02, 1941, 82 y.o., female Today's Date: 06/19/2023  END OF SESSION:  PT End of Session - 06/19/23 1447     Visit Number 9    Number of Visits 24    Date for PT Re-Evaluation 08/14/23    Authorization Type [employer]    Authorization - Visit Number 9    Progress Note Due on Visit 10    PT Start Time 1446    PT Stop Time 1532    PT Time Calculation (min) 46 min    Activity Tolerance Patient tolerated treatment well               Past Medical History:  Diagnosis Date   Allergy 1964   Anxiety 1986   Arthritis    Asthma    COPD (chronic obstructive pulmonary disease) (HCC)    Emphysema of lung (HCC) 2016   Hyperlipidemia    Thyroid disease    Past Surgical History:  Procedure Laterality Date   CESAREAN SECTION     EYE SURGERY  2018   Cataracts   Patient Active Problem List   Diagnosis Date Noted   Left shoulder pain 06/06/2023   Weight gain 06/08/2022   Grief at loss of child 06/08/2022   Postoperative hypothyroidism 03/08/2021   Centrilobular emphysema (HCC) 03/08/2021   Moderate persistent asthma without complication 03/08/2021   Osteoporosis screening 03/08/2021   Right hip pain 08/23/2020   Anxiety 07/23/2019   Diverticulosis 07/23/2019   Elevated cholesterol 07/23/2019   History of nicotine dependence 07/23/2019   Hyperplastic colon polyp 07/23/2019   Increased glucose level 07/23/2019   Osteoarthritis 07/23/2019    Redundant colon 07/23/2019   Seasonal allergic rhinitis 07/23/2019   Sensorineural hearing loss (SNHL) of both ears 07/23/2019   Vitamin D deficiency 07/23/2019   Xerosis of skin 07/23/2019   Chronic obstructive pulmonary disease (HCC) 07/23/2019   Pseudoptosis 09/16/2013   Dermatochalasis 09/16/2013    PCP: Christen Butter, NP  REFERRING PROVIDER: Christen Butter, NP  REFERRING DIAG: cervicalgia  THERAPY DIAG:  Other symptoms and signs involving the musculoskeletal system  Acute pain of left shoulder  Muscle weakness (generalized)  Cervical radiculopathy  Rationale for Evaluation and Treatment: Rehabilitation  ONSET DATE: 04/28/23  SUBJECTIVE:  SUBJECTIVE STATEMENT: Patient reports that her shoulder is definitely getting better. It is still sore to touch and with certain ways she moves or sometimes when lies on the L at night.     Hand dominance: Right  PERTINENT HISTORY:  R hip pain; arthritis   PAIN:  Are you having pain? Yes: NPRS scale: 1-2/10 at rest 5/10 with lifting arm  Pain location: L shoulder to middle of the L arm  Pain description: dull  Aggravating factors: lifting arm; reaching down;  Relieving factors: not moving arm; no change with prednisone   PRECAUTIONS: None    WEIGHT BEARING RESTRICTIONS: No  FALLS:  Has patient fallen in last 6 months? 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 She does some household chores and cooking; yard work; yoga 1x/wk at [employer]; church; reading; on line games   PATIENT GOALS: get rid of the L arm and use the arm normally   NEXT MD VISIT:  08/12/23  OBJECTIVE:   DIAGNOSTIC FINDINGS:  Cervical xray 05/20/23:  Severe multilevel spondylosis and facet hypertrophy, most pronounced at the C5-6 level as above. No acute displaced fracture  PATIENT SURVEYS:  FOTO 47; goal 61  SENSATION: WFL  POSTURE: Patient presents with head forward posture with increased thoracic kyphosis; shoulders rounded and elevated; scapulae abducted and rotated along the thoracic spine; head of the humerus anterior in orientation.   PALPATION: Pain with palpation L shoulder anterior shoulder at biceps tendon area; posterior shoulder at infraspinatus/teres area    CERVICAL ROM:   Active ROM A/PROM (deg) eval  Flexion 47  Extension 46  Right lateral flexion 28  Left lateral flexion 32  Right rotation 62  Left rotation 59   (Blank rows = not tested)  UPPER EXTREMITY ROM:  Active ROM Right eval Left eval  Shoulder flexion 147 66 pain L arm   Shoulder extension 67 pull 55 tight  Shoulder abduction 158 56 pain L arm   Shoulder adduction    Shoulder extension 57 arm at side  55 at side pull   Shoulder internal rotation Thumb T7 Thumb T10 pull L arm   Shoulder external rotation    Elbow flexion    Elbow extension    Wrist flexion    Wrist extension    Wrist ulnar deviation    Wrist radial deviation    Wrist pronation    Wrist supination     (Blank rows = not tested)  UPPER EXTREMITY MMT:  MMT Right eval Left eval  Shoulder flexion 5 3- pain  Shoulder extension 5 4 pain  Shoulder abduction 5 3- pain  Shoulder adduction    Shoulder extension 5 4  Shoulder internal rotation 5 4 pain  Shoulder external rotation 5 4+  Middle trapezius 4 3  Lower trapezius 4 3  Elbow flexion    Elbow extension    Wrist flexion    Wrist extension    Wrist ulnar deviation    Wrist radial deviation    Wrist pronation    Wrist supination    Grip strength     (Blank rows = not tested)  CERVICAL SPECIAL TESTS:  Spurling's test: Negative, and  Distraction test: Negative     [facility] Adult PT Treatment:  DATE: 06/19/23 Therapeutic Exercise: Sitting Shoulder flexion AROM to tolerance x 4 reps  Scap squeezes with noodle 10 sec x 10 "L"'s with noodle yellow TB minimal resistance x 10 "W"s with noodle x 10 Lt supination/pronation AROM elbow in at side x 12 Supine  Scap squeeze shoulder extension isometric 3 sec x 10  Standing  Wall slide with pillowcase bilat flexion x 10 L shoulder isometric abduction 10 sec x 10  L shoulder ER isometric 10 sec x 10  L shoulder IR isometric 10 sec x 10  Manual Therapy: STM L shoulder girdle tightness noted in upper traps; supraspinatus; leveator; teres; biceps; deltoid  PROM L shoulder into flexion; scaption; ER in scaption no pain  Skilled palpation to assess response to manual work and DN Trigger Point Dry-Needling  Treatment instructions: Expect mild to moderate muscle soreness. S/S of pneumothorax if dry needled over a lung field, and to seek immediate medical attention should they occur. Patient verbalized understanding of these instructions and education.  Patient Consent Given: Yes Education handout provided: Yes Muscles treated: L teres; triceps area/lats Electrical stimulation performed: No Parameters: N/A Treatment response/outcome: decreased palpable tightness    [facility] Adult PT Treatment:                                                DATE: 06/17/2023 Therapeutic Exercise: Standing (back against noodle): Scap squeezes 5x5", 5x10" L arms x10 W arms x10 Shoulder isometrics: abd, ER, IR, ext 10x5" each Standing with noodle: bent elbow sup/pron 1#DB Supine scap squeeze shoulder extension iso 2x10x3" Standing: Isometric row step out/in RTB x10 Isometric L shoulder ER YTB x15 Towel slides: flexion, scaption, M/L, circles    [facility] Adult PT Treatment:                                                DATE: 06/14/23 Therapeutic  Exercise: Pulley flexion x 2 minutes Seated with noodle behind back: scap squeeze 10 sec hold x 10, L's x 10, W' x 10 Lt shoulder isometrics: 10 x 10 sec hold abd, ER, IR Manual Therapy: STM Lt UT, pecs PROM Lt shoulder into flexion Trigger Point Dry-Needling  Treatment instructions: Expect mild to moderate muscle soreness. S/S of pneumothorax if dry needled over a lung field, and to seek immediate medical attention should they occur. Patient verbalized understanding of these instructions and education.  Patient Consent Given: Yes Education handout provided: Previously provided Muscles treated: Lt Upper trap Electrical stimulation performed: No Parameters: N/A Treatment response/outcome: palpable increase in muscle length   [facility] Adult PT Treatment:                                                DATE: 06/12/23 Therapeutic Exercise: Sitting Shoulder flexion AROM to tolerance x 4 reps  Scap squeezes with noodle 10 sec x 10 "L"'s with noodle x 10 "W"s with noodle x 10 Lt supination/pronation AROM elbow in at side x 12 Supine  Scap squeeze shoulder extension isometric 3 sec x 10  Standing  L shoulder isometric abduction 10 sec x 10  L  shoulder ER isometric 10 sec x 10  L shoulder IR isometric 10 sec x 10  Manual Therapy: STM L shoulder girdle tightness noted in upper traps; supraspinatus; leveator; teres; biceps; deltoid  PROM L shoulder into flexion; scaption; ER in scaption no pain  Skilled palpation to assess response to manual work and DN Trigger Point Dry-Needling  Treatment instructions: Expect mild to moderate muscle soreness. S/S of pneumothorax if dry needled over a lung field, and to seek immediate medical attention should they occur. Patient verbalized understanding of these instructions and education.  Patient Consent Given: Yes Education handout provided: Yes Muscles treated: L upper trap; pecs Electrical stimulation performed: No Parameters: N/A Treatment  response/outcome: decreased palpable tightness     PATIENT EDUCATION:  Education details: POC; HEP Person educated: Patient Education method: Programmer, multimedia, Demonstration, Actor cues, Verbal cues, and Handouts Education comprehension: verbalized understanding, returned demonstration, verbal cues required, tactile cues required, and needs further education  HOME EXERCISE PROGRAM: Access Code: [email or web address] URL: [email or web address] Date: 06/19/2023 Prepared by: Corlis Leak  Exercises - Supine Cervical Retraction with Towel  - 2 x daily - 7 x weekly - 1 sets - 5-10 reps - 10 sec  hold - Supine Scapular Retraction  - 2 x daily - 7 x weekly - 1 sets - 10 reps - 5-10 sec  hold - Supine Shoulder Flexion AAROM with Hands Clasped  - 2 x daily - 7 x weekly - 1 sets - 3-5 reps - 10 sec  hold - Anterior Shoulder and Biceps Stretch  - 2 x daily - 7 x weekly - 1 sets - 3 reps - 30 sec  hold - Forearm Supination with Dumbbell  - 1 x daily - 7 x weekly - 3 sets - 10 reps - Seated Elbow Flexion with Self-Anchored Resistance  - 1 x daily - 7 x weekly - 3 sets - 10 reps - Standing Bicep Stretch at Wall  - 1 x daily - 7 x weekly - 1 sets - 3-5 reps - 20-30 sec hold - Supine Scapular Retraction  - 2 x daily - 7 x weekly - 1 sets - 10 reps - 5-10 sec  hold - Isometric Shoulder Abduction at Wall  - 2 x daily - 7 x weekly - 1 sets - 5-10 reps - 5 sec  hold - Isometric Shoulder External Rotation at Wall  - 2 x daily - 7 x weekly - 1 sets - 5 reps - 5 sec  hold - Standing Isometric Shoulder Internal Rotation with Towel Roll at Doorway  - 2 x daily - 7 x weekly - 1 sets - 5 reps - 5 sec  hold - Isometric Shoulder Extension at Wall  - 1 x daily - 7 x weekly - 1 sets - 5-10 reps - 5 sec  hold - Shoulder Flexion Wall Slide with Towel  - 1 x daily - 7 x weekly - 1 sets - 5 reps - 5 sec  hold - Shoulder External Rotation and Scapular Retraction with Resistance  - 2 x daily - 7 x weekly - 1 sets - 10 reps -  3-5 sec  hold - Seated Shoulder W  - 2 x daily - 7 x weekly - 1 sets - 10 reps - 3 sec  hold  ASSESSMENT:  CLINICAL IMPRESSION: Good improvement in L shoulder pain with increasing AROM and decreased pain with movement. Continued with ROM and active exercises including gentle TB exercise. Palpable tightness noted in the L  lats, teres, posterior shoulder/scapular area. Good response to DN and manual work. Progressing well toward stated goals of therapy. Continued work on posture and alignment.    06/05/23: She has increased functional activity tolerance at home but she has persistent pain with AROM in forward flexion and scaption. Note L shoulder girdle tightness in upper traps; supraspinatus; leveator; teres; biceps; deltoid. No pain with PROM L shoulder which would indicate muscle tendon pathology; likely rotator cuff. Continued focus on postural strengthening and isometric strengthening today. Encouraged pt to continue consistent HEP. Good tolerance to scapular strengthening including isometrics.    OBJECTIVE IMPAIRMENTS: decreased activity tolerance, decreased mobility, decreased ROM, decreased strength, hypomobility, increased fascial restrictions, increased muscle spasms, impaired UE functional use, improper body mechanics, postural dysfunction, and pain.    GOALS: Goals reviewed with patient? Yes  SHORT TERM GOALS: Target date: 07/03/2023  Independent in initial HEP  Baseline:  Goal status: INITIAL  2.  Decrease L shoulder pain by 25-50% allowing patient to use L UE for light functional activities at home  Baseline:  Goal status: INITIAL   LONG TERM GOALS: Target date: 08/14/2023  Improve posture and alignment with patient to demonstrate improved posture with posterior shoulder girdle musculature engaged  Baseline:  Goal status: INITIAL  2.  Increase AROM L shoulder to =/> than AROM R shoulder  Baseline:  Goal status: INITIAL  3.  4/5 to 5/5 strength L shoulder  Baseline:   Goal status: INITIAL  4.  Decreased pain L shoulder by 70-100% allowing patient to return to normal functional activities including yard work and household chores  Baseline:  Goal status: INITIAL  5.  Independent in HEP including aquatic therapy as indicated  Baseline:  Goal status: INITIAL  6.  Improve functional limitation score to 61 Baseline: 47 Goal status: INITIAL   PLAN:  PT FREQUENCY: 2x/week  PT DURATION: 12 weeks  PLANNED INTERVENTIONS: Therapeutic exercises, Therapeutic activity, Neuromuscular re-education, Balance training, Gait training, Patient/Family education, Self Care, Joint mobilization, Aquatic Therapy, Dry Needling, Spinal mobilization, Cryotherapy, Moist heat, Taping, Ultrasound, Ionotophoresis 4mg /ml Dexamethasone, Manual therapy, and Re-evaluation  PLAN FOR NEXT SESSION: update HEP, continue with postural correction and education as well as spine care education; manual work, DN, modalities as indicated    [employer], PT 06/19/2023, 2:48 PM

## 2023-07-12 ENCOUNTER — Encounter: Payer: Self-pay | Admitting: Medical-Surgical

## 2023-07-14 ENCOUNTER — Encounter (HOSPITAL_BASED_OUTPATIENT_CLINIC_OR_DEPARTMENT_OTHER): Payer: Self-pay

## 2023-07-14 ENCOUNTER — Other Ambulatory Visit: Payer: Self-pay

## 2023-07-14 ENCOUNTER — Emergency Department (HOSPITAL_BASED_OUTPATIENT_CLINIC_OR_DEPARTMENT_OTHER): Payer: PPO

## 2023-07-14 ENCOUNTER — Emergency Department (HOSPITAL_BASED_OUTPATIENT_CLINIC_OR_DEPARTMENT_OTHER)
Admission: EM | Admit: 2023-07-14 | Discharge: 2023-07-14 | Disposition: A | Discharge status: Home / Self Care | Payer: PPO | Attending: Emergency Medicine | Admitting: Emergency Medicine

## 2023-07-14 DIAGNOSIS — Z87891 Personal history of nicotine dependence: Secondary | ICD-10-CM | POA: Insufficient documentation

## 2023-07-14 DIAGNOSIS — Z1152 Encounter for screening for COVID-19: Secondary | ICD-10-CM | POA: Diagnosis not present

## 2023-07-14 DIAGNOSIS — J40 Bronchitis, not specified as acute or chronic: Secondary | ICD-10-CM | POA: Diagnosis not present

## 2023-07-14 DIAGNOSIS — J441 Chronic obstructive pulmonary disease with (acute) exacerbation: Secondary | ICD-10-CM | POA: Insufficient documentation

## 2023-07-14 DIAGNOSIS — R0602 Shortness of breath: Secondary | ICD-10-CM | POA: Diagnosis not present

## 2023-07-14 DIAGNOSIS — R000 Tachycardia, unspecified: Secondary | ICD-10-CM | POA: Diagnosis not present

## 2023-07-14 DIAGNOSIS — R059 Cough, unspecified: Secondary | ICD-10-CM | POA: Diagnosis not present

## 2023-07-14 DIAGNOSIS — I700 Atherosclerosis of aorta: Secondary | ICD-10-CM | POA: Diagnosis not present

## 2023-07-14 LAB — CBC WITH DIFFERENTIAL/PLATELET
Abs Immature Granulocytes: 0.15 10*3/uL — ABNORMAL HIGH (ref 0.00–0.07)
Basophils Absolute: 0.1 10*3/uL (ref 0.0–0.1)
Basophils Relative: 1 %
Eosinophils Absolute: 0 10*3/uL (ref 0.0–0.5)
Eosinophils Relative: 0 %
HCT: 45.4 % (ref 36.0–46.0)
Hemoglobin: 14.6 g/dL (ref 12.0–15.0)
Immature Granulocytes: 2 %
Lymphocytes Relative: 11 %
Lymphs Abs: 1.1 10*3/uL (ref 0.7–4.0)
MCH: 29.5 pg (ref 26.0–34.0)
MCHC: 32.2 g/dL (ref 30.0–36.0)
MCV: 91.7 fL (ref 80.0–100.0)
Monocytes Absolute: 0.3 10*3/uL (ref 0.1–1.0)
Monocytes Relative: 3 %
Neutro Abs: 8.6 10*3/uL — ABNORMAL HIGH (ref 1.7–7.7)
Neutrophils Relative %: 83 %
Platelets: 163 10*3/uL (ref 150–400)
RBC: 4.95 MIL/uL (ref 3.87–5.11)
RDW: 13.5 % (ref 11.5–15.5)
WBC: 10.2 10*3/uL (ref 4.0–10.5)
nRBC: 0 % (ref 0.0–0.2)

## 2023-07-14 LAB — BASIC METABOLIC PANEL
Anion gap: 11 (ref 5–15)
BUN: 20 mg/dL (ref 8–23)
CO2: 25 mmol/L (ref 22–32)
Calcium: 9 mg/dL (ref 8.9–10.3)
Chloride: 97 mmol/L — ABNORMAL LOW (ref 98–111)
Creatinine, Ser: 0.92 mg/dL (ref 0.44–1.00)
GFR, Estimated: >60 mL/min (ref >60)
Glucose, Bld: 127 mg/dL — ABNORMAL HIGH (ref 70–99)
Potassium: 4.7 mmol/L (ref 3.5–5.1)
Sodium: 133 mmol/L — ABNORMAL LOW (ref 135–145)

## 2023-07-14 LAB — RESP PANEL BY RT-PCR (RSV, FLU A&B, COVID)  RVPGX2
Influenza A by PCR: NEGATIVE
Influenza B by PCR: NEGATIVE
Resp Syncytial Virus by PCR: NEGATIVE
SARS Coronavirus 2 by RT PCR: NEGATIVE

## 2023-07-14 LAB — LACTIC ACID, PLASMA: Lactic Acid, Venous: 1.3 mmol/L (ref 0.5–1.9)

## 2023-07-14 MED ORDER — ALBUTEROL SULFATE HFA 108 (90 BASE) MCG/ACT IN AERS
2.0000 | INHALATION_SPRAY | Freq: Once | RESPIRATORY_TRACT | Status: AC
  Administered 2023-07-14: 2 via RESPIRATORY_TRACT

## 2023-07-14 MED ORDER — MAGNESIUM SULFATE 2 GM/50ML IV SOLN
2.0000 g | Freq: Once | INTRAVENOUS | Status: AC
  Administered 2023-07-14: 2 g via INTRAVENOUS
  Filled 2023-07-14: qty 50

## 2023-07-14 MED ORDER — ALBUTEROL SULFATE HFA 108 (90 BASE) MCG/ACT IN AERS
2.0000 | INHALATION_SPRAY | RESPIRATORY_TRACT | Status: DC | PRN
  Filled 2023-07-14: qty 6.7

## 2023-07-14 MED ORDER — IPRATROPIUM-ALBUTEROL 0.5-2.5 (3) MG/3ML IN SOLN
RESPIRATORY_TRACT | Status: AC
  Administered 2023-07-14: 3 mL
  Filled 2023-07-14: qty 3

## 2023-07-14 MED ORDER — METHYLPREDNISOLONE SODIUM SUCC 125 MG IJ SOLR
125.0000 mg | Freq: Once | INTRAMUSCULAR | Status: AC
  Administered 2023-07-14: 125 mg via INTRAVENOUS
  Filled 2023-07-14: qty 2

## 2023-07-14 MED ORDER — DOXYCYCLINE HYCLATE 100 MG PO TABS
100.0000 mg | ORAL_TABLET | Freq: Once | ORAL | Status: AC
  Administered 2023-07-14: 100 mg via ORAL
  Filled 2023-07-14: qty 1

## 2023-07-14 MED ORDER — PREDNISONE 20 MG PO TABS: 60.0000 mg | ORAL_TABLET | Freq: Every day | ORAL | 0 refills | Status: DC

## 2023-07-14 MED ORDER — ALBUTEROL SULFATE (2.5 MG/3ML) 0.083% IN NEBU
INHALATION_SOLUTION | RESPIRATORY_TRACT | Status: AC
  Administered 2023-07-14: 2.5 mg
  Filled 2023-07-14: qty 3

## 2023-07-14 MED ORDER — DOXYCYCLINE HYCLATE 100 MG PO CAPS: 100.0000 mg | ORAL_CAPSULE | Freq: Two times a day (BID) | ORAL | 0 refills | Status: DC

## 2023-07-14 NOTE — ED Notes (Signed)

## 2023-07-14 NOTE — Discharge Instructions (Signed)
You were seen in the emergency department for increased shortness of breath and cough.  Your chest x-ray did not show an obvious pneumonia and your COVID swab was negative.  We are treating with antibiotics steroids and breathing treatments for a COPD exacerbation.  Please continue to use your inhaler 2 puffs every 4 hours as needed.  Follow-up with your primary care doctor and your pulmonologist.  Return if any worsening or concerning symptoms.

## 2023-07-14 NOTE — ED Provider Notes (Signed)
[place] [facility] Provider Note   CSN: [id number] Arrival date & time: 07/14/23  1847     History {Add pertinent medical, surgical, social history, OB history to HPI:1} Chief Complaint  Patient presents with   Shortness of Breath   Cough    Christine Barry is a 82 y.o. female.  She has a history of COPD, former smoker.  She said she started getting sick a few days ago with a cough and shortness of breath.  No fever.  Cough productive of some pale yellow sputum.  Symptoms acutely got worse today.  No chest pain.  She started on some prednisone that she had leftover at home.  She does not usually use an inhaler so she did not try it.  She was borderline hypoxic at triage and so was placed on oxygen.  She said she needs to get her seasonal COVID and her flu shot but was up-to-date as of last year.  The history is provided by the patient.  Shortness of Breath Severity:  Moderate Onset quality:  Gradual Duration:  3 days Timing:  Constant Progression:  Worsening Chronicity:  Recurrent Relieved by:  Nothing Worsened by:  Activity and coughing Ineffective treatments:  Rest Associated symptoms: cough, sputum production and wheezing   Associated symptoms: no abdominal pain, no chest pain, no fever and no hemoptysis   Cough Associated symptoms: shortness of breath and wheezing   Associated symptoms: no chest pain and no fever        Home Medications Prior to Admission medications   Medication Sig Start Date End Date Taking? Authorizing Provider  albuterol (VENTOLIN HFA) 108 (90 Base) MCG/ACT inhaler Inhale 2 puffs into the lungs every 6 (six) hours as needed for wheezing or shortness of breath. 05/15/22   Luciano Cutter, MD  ALPRAZolam Prudy Feeler) 0.25 MG tablet Take 0.25 mg by mouth at bedtime as needed for anxiety.    [provider]  cetirizine (ZYRTEC) 10 MG tablet Take 10 mg by mouth daily as needed.    [provider]   Cholecalciferol 50 MCG (2000 UT) TABS Take 2,000 Units by mouth daily. 04/29/17   [provider]  cyanocobalamin 1000 MCG tablet Take 1,000 mcg by mouth 3 (three) times a week. 11/01/15   [provider]  cyclobenzaprine (FLEXERIL) 10 MG tablet Take 0.5-1 tablets (5-10 mg total) by mouth 3 (three) times daily as needed for muscle spasms. Caution: can cause drowsiness 05/14/23   Christen Butter, NP  EUTHYROX 200 MCG tablet TAKE 1 TABLET BY MOUTH ONCE DAILY BEFORE BREAKFAST EXCEPT  THE  2ND  AND  4TH  SUNDAY  OF  THE  MONTH 02/08/23   Christen Butter, NP  Ferrous Sulfate (IRON PO) Take by mouth. Nature made    [provider]  finasteride (PROSCAR) 5 MG tablet Take 5 mg by mouth daily. 03/27/22   [provider]  Fluticasone-Umeclidin-Vilant (TRELEGY ELLIPTA) 100-62.5-25 MCG/ACT AEPB INHALE 1 PUFF ONCE DAILY 06/06/23   Luciano Cutter, MD  meloxicam (MOBIC) 15 MG tablet Take 1 tablet (15 mg total) by mouth daily. 02/07/23   Christen Butter, NP  montelukast (SINGULAIR) 10 MG tablet TAKE 1 TABLET BY MOUTH AT BEDTIME 05/15/23   Christen Butter, NP  predniSONE (DELTASONE) 50 MG tablet Take 1 tablet (50 mg total) by mouth daily. 05/14/23   Christen Butter, NP  simvastatin (ZOCOR) 40 MG tablet Take 1 tablet (40 mg total) by mouth at bedtime. 07/23/22  Christen Butter, NP  UNABLE TO FIND Med Name: [employer] - eye drops 1 drop in each eye twice a day.    [provider]      Allergies    Penicillins and Sulfa antibiotics    Review of Systems   Review of Systems  Constitutional:  Negative for fever.  Respiratory:  Positive for cough, sputum production, shortness of breath and wheezing. Negative for hemoptysis.   Cardiovascular:  Negative for chest pain.  Gastrointestinal:  Negative for abdominal pain.    Physical Exam Updated Vital Signs BP (!) 186/87   Pulse 89   Temp 97.7 F (36.5 C) (Oral)   Resp (!) 30   Ht 5\' 6"  (1.676 m)   Wt 76 kg   SpO2 97%   BMI 27.04 kg/m  Physical  Exam Vitals and nursing note reviewed.  Constitutional:      General: She is not in acute distress.    Appearance: She is well-developed.  HENT:     Head: Normocephalic and atraumatic.  Eyes:     Conjunctiva/sclera: Conjunctivae normal.  Cardiovascular:     Rate and Rhythm: Normal rate and regular rhythm.     Heart sounds: No murmur heard. Pulmonary:     Effort: Tachypnea and accessory muscle usage present. No respiratory distress.     Breath sounds: Rhonchi (few scattered) present.  Abdominal:     Palpations: Abdomen is soft.     Tenderness: There is no abdominal tenderness.  Musculoskeletal:        General: No swelling.     Cervical back: Neck supple.     Right lower leg: No tenderness. No edema.     Left lower leg: No tenderness. No edema.  Skin:    General: Skin is warm and dry.     Capillary Refill: Capillary refill takes less than 2 seconds.  Neurological:     General: No focal deficit present.     Mental Status: She is alert.     ED Results / Procedures / Treatments   Labs (all labs ordered are listed, but only abnormal results are displayed) Labs Reviewed  RESP PANEL BY RT-PCR (RSV, FLU A&B, COVID)  [id number]  BASIC METABOLIC PANEL  CBC WITH DIFFERENTIAL/PLATELET  LACTIC ACID, PLASMA  LACTIC ACID, PLASMA    EKG None  Radiology No results found.  Procedures Procedures  {Document cardiac monitor, telemetry assessment procedure when appropriate:1}  Medications Ordered in ED Medications  albuterol (VENTOLIN HFA) 108 (90 Base) MCG/ACT inhaler 2 puff (has no administration in time range)  magnesium sulfate IVPB 2 g 50 mL (has no administration in time range)  methylPREDNISolone sodium succinate (SOLU-MEDROL) 125 mg/2 mL injection 125 mg (has no administration in time range)  ipratropium-albuterol (DUONEB) 0.5-2.5 (3) MG/3ML nebulizer solution (3 mLs  Given 07/14/23 1857)  albuterol (PROVENTIL) (2.5 MG/3ML) 0.083% nebulizer solution (2.5 mg  Given 07/14/23 1857)     ED Course/ Medical Decision Making/ A&P   {   Click here for ABCD2, HEART and other calculatorsREFRESH Note before signing :1}                              Medical Decision Making Amount and/or Complexity of Data Reviewed Labs: ordered. Radiology: ordered.  Risk Prescription drug management.   This patient complains of ***; this involves an extensive number of treatment Options and is a complaint that carries with it a high risk of complications and  morbidity. The differential includes ***  I ordered, reviewed and interpreted labs, which included *** I ordered medication *** and reviewed PMP when indicated. I ordered imaging studies which included *** and I independently    visualized and interpreted imaging which showed *** Additional history obtained from *** Previous records obtained and reviewed *** I consulted *** and discussed lab and imaging findings and discussed disposition.  Cardiac monitoring reviewed, *** Social determinants considered, *** Critical Interventions: ***  After the interventions stated above, I reevaluated the patient and found *** Admission and further testing considered, ***   {Document critical care time when appropriate:1} {Document review of labs and clinical decision tools ie heart score, Chads2Vasc2 etc:1}  {Document your independent review of radiology images, and any outside records:1} {Document your discussion with family members, caretakers, and with consultants:1} {Document social determinants of health affecting pt's care:1} {Document your decision making why or why not admission, treatments were needed:1} Final Clinical Impression(s) / ED Diagnoses Final diagnoses:  None    Rx / DC Orders ED Discharge Orders     None

## 2023-07-14 NOTE — ED Triage Notes (Signed)
The patient has had a cough since Thursday. She is on prednisone for same. She is having shortness of breath.

## 2023-07-16 ENCOUNTER — Encounter: Payer: Self-pay | Admitting: Medical-Surgical

## 2023-07-16 ENCOUNTER — Ambulatory Visit (INDEPENDENT_AMBULATORY_CARE_PROVIDER_SITE_OTHER): Payer: PPO | Admitting: Medical-Surgical

## 2023-07-16 VITALS — BP 133/68 | HR 94 | Temp 98.4°F | Resp 20 | Ht 66.0 in | Wt 173.2 lb

## 2023-07-16 DIAGNOSIS — Z09 Encounter for follow-up examination after completed treatment for conditions other than malignant neoplasm: Secondary | ICD-10-CM | POA: Diagnosis not present

## 2023-07-16 DIAGNOSIS — E890 Postprocedural hypothyroidism: Secondary | ICD-10-CM

## 2023-07-16 DIAGNOSIS — R7303 Prediabetes: Secondary | ICD-10-CM

## 2023-07-16 DIAGNOSIS — J441 Chronic obstructive pulmonary disease with (acute) exacerbation: Secondary | ICD-10-CM

## 2023-07-16 MED ORDER — ALBUTEROL SULFATE (2.5 MG/3ML) 0.083% IN NEBU: 2.5000 mg | INHALATION_SOLUTION | Freq: Four times a day (QID) | RESPIRATORY_TRACT | 1 refills | Status: DC | PRN

## 2023-07-16 MED ORDER — AIRSUPRA 90-80 MCG/ACT IN AERO
2.0000 | INHALATION_SPRAY | Freq: Four times a day (QID) | RESPIRATORY_TRACT | 0 refills | Status: DC | PRN
Start: 2023-07-16 — End: 2023-09-09

## 2023-07-16 NOTE — Progress Notes (Signed)
        Established patient visit  History, exam, impression, and plan:  1. Hospital discharge follow-up 2. Chronic obstructive pulmonary disease with acute exacerbation [facility]) Pleasant 83 year old female presenting today for COPD exacerbation and hospital discharge follow-up.  She has had significant respiratory distress along with increased congestion.  Also having issues with hypoxia.  Her lowest pulse ox was 85% at home.  She went to the ED on 07/14/2023 where she was diagnosed with COPD exacerbation.  She was sent home with oral steroids and doxycycline twice daily x 7 days.  X-ray showed bibasilar atelectasis versus scarring but no other active pulmonary process.  Reports that she is feeling some better since she started the steroids and doxycycline and her oxygen level at home has been 92-94% on room air.  She does have increased secretions and a regular cough.  In office, she is tachypneic with frequent strong wet coughing.  Vitals are stable.  Decreased lung sounds throughout with intermittent expiratory wheezing.  She does see pulmonology and will see them next week.  For now continue Trelegy.  Continue Zyrtec.  Complete doxycycline and prednisone as prescribed.  Sending in albuterol nebulizer solution as this seems to get deeper in her lungs and works better.  Also sending in Airsupra to replace her current albuterol inhale to see if this works better than albuterol alone.  I did caution her to only use 1 of these at a time rather than using the nebulizer/inhaler and using Airsupra.  Patient verbalized understanding is agreeable to the plan.  3. Postoperative hypothyroidism Taking levothyroxine as prescribed.  Checking TSH today. - TSH  4. Prediabetes Checking hemoglobin A1c. - Hemoglobin A1c  Procedures performed this visit: None.  Return if symptoms worsen or fail to improve.  __________________________________ Thayer Ohm, DNP, APRN, FNP-BC Primary Care and Sports Medicine [facility] [place]

## 2023-07-17 ENCOUNTER — Other Ambulatory Visit: Payer: Self-pay | Admitting: Medical-Surgical

## 2023-07-17 DIAGNOSIS — E890 Postprocedural hypothyroidism: Secondary | ICD-10-CM

## 2023-07-17 LAB — TSH: TSH: 0.651 u[IU]/mL (ref 0.450–4.500)

## 2023-07-17 LAB — HEMOGLOBIN A1C
Est. average glucose Bld gHb Est-mCnc: 126 mg/dL
Hgb A1c MFr Bld: 6 % — ABNORMAL HIGH (ref 4.8–5.6)

## 2023-07-17 MED ORDER — EUTHYROX 200 MCG PO TABS
ORAL_TABLET | ORAL | 1 refills | Status: DC
Start: 2023-07-17 — End: 2023-07-22

## 2023-07-18 ENCOUNTER — Ambulatory Visit (INDEPENDENT_AMBULATORY_CARE_PROVIDER_SITE_OTHER): Payer: PPO | Admitting: Sports Medicine

## 2023-07-18 ENCOUNTER — Encounter: Payer: Self-pay | Admitting: Sports Medicine

## 2023-07-18 DIAGNOSIS — M25512 Pain in left shoulder: Secondary | ICD-10-CM | POA: Diagnosis not present

## 2023-07-18 DIAGNOSIS — G8929 Other chronic pain: Secondary | ICD-10-CM

## 2023-07-18 DIAGNOSIS — J441 Chronic obstructive pulmonary disease with (acute) exacerbation: Secondary | ICD-10-CM | POA: Diagnosis not present

## 2023-07-18 NOTE — Assessment & Plan Note (Signed)
Pleasant 82 year old female, multifactorial left shoulder pain after picking up a suitcase. After failure of some conservative treatment we noted impingement signs and bicipital signs, I injected her subacromial bursa and her biceps sheath, she continued with additional physical therapy and she returns today symptom-free, we discussed avoiding activities above the shoulder when possible and continuing home PT, return as needed.

## 2023-07-18 NOTE — Progress Notes (Signed)
    Procedures performed today:    None.  Independent interpretation of notes and tests performed by another provider:   None.  Brief History, Exam, Impression, and Recommendations:    Left shoulder pain Pleasant 82 year old female, multifactorial left shoulder pain after picking up a suitcase. After failure of some conservative treatment we noted impingement signs and bicipital signs, I injected her subacromial bursa and her biceps sheath, she continued with additional physical therapy and she returns today symptom-free, we discussed avoiding activities above the shoulder when possible and continuing home PT, return as needed.    ____________________________________________ Ihor Austin. Benjamin Stain, M.D., ABFM., CAQSM., AME. Primary Care and Sports Medicine [facility]  Adjunct Professor of Family Medicine  [place] [employer]  Restaurant manager, fast food

## 2023-07-22 ENCOUNTER — Encounter (HOSPITAL_BASED_OUTPATIENT_CLINIC_OR_DEPARTMENT_OTHER): Payer: Self-pay | Admitting: Pulmonary Disease

## 2023-07-22 ENCOUNTER — Ambulatory Visit (HOSPITAL_BASED_OUTPATIENT_CLINIC_OR_DEPARTMENT_OTHER): Payer: PPO | Admitting: Pulmonary Disease

## 2023-07-22 VITALS — BP 126/64 | HR 70 | Resp 20 | Ht 66.0 in | Wt 173.0 lb

## 2023-07-22 DIAGNOSIS — J441 Chronic obstructive pulmonary disease with (acute) exacerbation: Secondary | ICD-10-CM | POA: Diagnosis not present

## 2023-07-22 MED ORDER — TRELEGY ELLIPTA 100-62.5-25 MCG/ACT IN AEPB: 1.0000 | INHALATION_SPRAY | Freq: Every day | RESPIRATORY_TRACT | 5 refills | Status: DC

## 2023-07-22 MED ORDER — PREDNISONE 10 MG PO TABS: ORAL_TABLET | ORAL | 0 refills | Status: AC

## 2023-07-22 NOTE — Patient Instructions (Signed)
Severe COPD/asthma overlap COPD exacerbation --Prednisone taper ordered --CONTINUE Trelegy 100 ONE puff ONCE a day --CONTINUE montelukast 10 mg --CONTINUE Albuterol AS NEEDED for shortness of breath or wheezing --CONTINUE Duonebs AS NEEDED  for shortness of breath or wheezing --Use flutter valve AS NEEDED for mucous clearance

## 2023-07-22 NOTE — Progress Notes (Signed)
Subjective:   PATIENT ID: Christine Barry GENDER: female DOB: 18-Feb-1941, MRN: [id number]   HPI  Chief Complaint  Patient presents with   Follow-up    Went to ED Sunday a week ago 10/6. Was given oxygen nebulizer, antibiotic. Its been a week, prednisone ran out Thursday after 4 days of 60 mg, felt better on it. Is taking mucinex, ibuprofen, inhalers and nebulizer now. Asking for 800 mg ibuprofen and prednisone refills   Reason for Visit: follow-up  [name] is a 82 year old female former smoker with COPD who presents for follow-up.  Synopsis: She was diagnosed with COPD in 2016 and followed by a Pulmonogist, Dr. Wilma Flavin in [place]. She moved to be near her daughter in [place], [place]. At baseline, she is active and enjoys going to the farmer's market weekly. Denies coughing shortness of breath and wheezing. She is compliant with her bronchodilators with Dulera and rarely used her albuterol. In 2021 she had a prolonged exacerbation requiring 2-3 months of steroids.  04/25/21 She established care at [facility] in 04/2021 and started treatment for COPD exacerbation. COVID-19 neg x 3 prior to her appointment. On one week follow-up today she reports one to two good days but has persistent shortness of breath, wheezing cough. During the prednisone taper her symptom seem to have recurred. Symptoms are worse in the mornings. After nebulizer treatments she will have a productive cough. Her oxygen levels have dropped to 89% at home with activity. Does not have supplemental O2. She has been compliant with Duonebs QID and symbicort twice daily.   07/06/21 In July she was treated for COPD exacerbation and then started on Trelegy 100 on follow-up visit with Dr. Maple Hudson. She rarely uses albuterol except before exercise. She recently broke her hip has limited her activity. She is doing yoga, light cardio and resistance exercises at [employer]. No longer wearing oxygen as she feels she  is doing better.   05/15/22 She reports her COPD is well controlled. Uses albuterol once a day prior to exercise at [employer] including cardio, weights and yoga. She was last treated with prednisone and azithromycin in March 2023. No exacerbations since then. Denies shortness of breath, cough, wheezing. Her son committed suicide in her home March. She tries to be active in her family's lives and work on being healthy. Is not in counseling but will share her grief with anyone including friends and neighbors. She believes she is compartmentalizing her feelings and has been advised by her sister to seek counseling for her guilt and grief.  07/22/23 Since our last visit she had to go the ED on 07/14/23 after stopping her Zyrtec. Was prescribed doxycycline and prednisone 60 mg daily and abruptly stopped. Also on mucinex. Her shortness of breath, chest congestion, chest tightness, some wheezing returned. Has some cough. Compliant with Trelegy and montelukast. Was not using albuterol until she was sick and currently on rescue and nebulizer 4-6 hours.   Social History: Former smoker. Quit in 2001. 76 pack-years  Past Medical History:  Diagnosis Date   Allergy 1964   Anxiety 1986   Arthritis    Asthma    COPD (chronic obstructive pulmonary disease) (HCC)    Emphysema of lung (HCC) 2016   Hyperlipidemia    Thyroid disease     Allergies  Allergen Reactions   Penicillins Other (See Comments)    Not sure (occurred in the 1970's), but thinks she "passed out"   Sulfa Antibiotics  Outpatient Medications Prior to Visit  Medication Sig Dispense Refill   albuterol (PROVENTIL) (2.5 MG/3ML) 0.083% nebulizer solution Take 3 mLs (2.5 mg total) by nebulization every 6 (six) hours as needed for wheezing or shortness of breath. 150 mL 1   albuterol (VENTOLIN HFA) 108 (90 Base) MCG/ACT inhaler Inhale 2 puffs into the lungs every 6 (six) hours as needed for wheezing or shortness of breath. 8 g 1    Albuterol-Budesonide (AIRSUPRA) 90-80 MCG/ACT AERO Inhale 2 puffs into the lungs every 6 (six) hours as needed. 1 g 0   ALPRAZolam (XANAX) 0.25 MG tablet Take 0.25 mg by mouth at bedtime as needed for anxiety.     cetirizine (ZYRTEC) 10 MG tablet Take 10 mg by mouth daily as needed.     Cholecalciferol 50 MCG (2000 UT) TABS Take 2,000 Units by mouth daily.     cyanocobalamin 1000 MCG tablet Take 1,000 mcg by mouth 3 (three) times a week.     cyclobenzaprine (FLEXERIL) 10 MG tablet Take 0.5-1 tablets (5-10 mg total) by mouth 3 (three) times daily as needed for muscle spasms. Caution: can cause drowsiness 60 tablet 1   doxycycline (VIBRAMYCIN) 100 MG capsule Take 1 capsule (100 mg total) by mouth 2 (two) times daily. 13 capsule 0   Ferrous Sulfate (IRON PO) Take by mouth. Nature made     finasteride (PROSCAR) 5 MG tablet Take 5 mg by mouth daily.     meloxicam (MOBIC) 15 MG tablet Take 1 tablet (15 mg total) by mouth daily. 90 tablet 3   montelukast (SINGULAIR) 10 MG tablet TAKE 1 TABLET BY MOUTH AT BEDTIME 90 tablet 0   simvastatin (ZOCOR) 40 MG tablet Take 1 tablet (40 mg total) by mouth at bedtime. 90 tablet 3   UNABLE TO FIND Med Name: [employer] - eye drops 1 drop in each eye twice a day.     EUTHYROX 200 MCG tablet TAKE 1 TABLET BY MOUTH ONCE DAILY BEFORE BREAKFAST EXCEPT  THE  2ND  AND  4TH  SUNDAY  OF  THE  MONTH 90 tablet 1   Fluticasone-Umeclidin-Vilant (TRELEGY ELLIPTA) 100-62.5-25 MCG/ACT AEPB INHALE 1 PUFF ONCE DAILY 60 each 1   predniSONE (DELTASONE) 20 MG tablet Take 3 tablets (60 mg total) by mouth daily. 12 tablet 0   No facility-administered medications prior to visit.    Review of Systems  Constitutional:  Negative for chills, diaphoresis, fever, malaise/fatigue and weight loss.  HENT:  Negative for congestion.   Respiratory:  Positive for cough, sputum production, shortness of breath and wheezing. Negative for hemoptysis.   Cardiovascular:  Negative for chest pain (chest  tightness), palpitations and leg swelling.     Objective:   Vitals:   07/22/23 1347  BP: 126/64  Pulse: 70  Resp: 20  SpO2: 94%  Weight: 173 lb (78.5 kg)  Height: 5\' 6"  (1.676 m)   SpO2: 94 %  Physical Exam: General: Well-appearing, no acute distress HENT: [place], AT Eyes: EOMI, no scleral icterus Respiratory: Clear to auscultation bilaterally.  No crackles, wheezing or rales Cardiovascular: RRR, -M/R/G, no JVD Extremities:-Edema,-tenderness Neuro: AAO x4, CNII-XII grossly intact Psych: Normal mood, normal affect   Data Reviewed:  Imaging: CXR 04/18/21 - No acute infiltrate, effusion or edema CXR 07/14/23 - No acute infiltrate effusion or edema  PFT: OSH 2019 FEV1 0.8. TGV 174% DLCO 49%. Positive bronchodilator response. Interpretation: Severe obstructive defect with hyperinflation and reduced gas exchange consistent with emphysema.  03/18/2020 spirometry: FEV1 0.54, 24%,  FVC 1.3, ratio 0.41.   12/26/2020: FEV1 0.6, 25%, FVC 1.3, ratio 0.44.  Interpretation: Very severe obstructive defect   Labs: OSH 2016 alpha-1 MS phenotype with a level of 135   CBC    Component Value Date/Time   WBC 10.2 07/14/2023 1941   RBC 4.95 07/14/2023 1941   HGB 14.6 07/14/2023 1941   HCT 45.4 07/14/2023 1941   PLT 163 07/14/2023 1941   MCV 91.7 07/14/2023 1941   MCH 29.5 07/14/2023 1941   MCHC 32.2 07/14/2023 1941   RDW 13.5 07/14/2023 1941   LYMPHSABS 1.1 07/14/2023 1941   MONOABS 0.3 07/14/2023 1941   EOSABS 0.0 07/14/2023 1941   BASOSABS 0.1 07/14/2023 1941  Absolute 06/08/21 - 560  Assessment & Plan:   Discussion: 82 year old female former smoker with severe COPD (FEV1 25%) who presents for follow-up. Recent COPD exacerbation 07/2023 but still having persistent symptoms. Previously controlled on low dose Trelegy. Discussed clinical course and management of COPD/asthma including bronchodilator regimen, preventive care including vaccinations and action plan for  exacerbation.  Severe COPD/asthma overlap COPD exacerbation --Prednisone taper ordered --CONTINUE Trelegy 100 ONE puff ONCE a day --CONTINUE montelukast 10 mg --CONTINUE Albuterol AS NEEDED for shortness of breath or wheezing --CONTINUE Duonebs AS NEEDED  for shortness of breath or wheezing --Use flutter valve AS NEEDED for mucous clearance  Allergic Rhinitis --CONTINUE Flonase 1 spray twice a day as needed  Health Maintenance Immunization History  Administered Date(s) Administered   Influenza Split 08/02/2010, 08/09/2016, 07/15/2017, 07/18/2018   Influenza, High Dose Seasonal PF 07/07/2015, 08/10/2016, 07/23/2019   Influenza, Quadrivalent, Recombinant, Inj, Pf 07/18/2018   Influenza-Unspecified 06/26/2011, 07/21/2020, 07/12/2022   PFIZER(Purple Top)SARS-COV-2 Vaccination 11/02/2019, 11/23/2019, 07/05/2020, 07/01/2021, 05/08/2022   Pfizer(Comirnaty)Fall Seasonal Vaccine 12 years and older 08/09/2022   Pneumococcal Conjugate-13 10/08/2005, 06/21/2014, 07/18/2018, 07/22/2018   Pneumococcal Polysaccharide-23 06/04/2019   Pneumococcal-Unspecified 10/08/2005   Tdap 03/01/2018   Zoster Recombinant(Shingrix) 12/16/2017, 05/05/2018   Zoster, Live 08/02/2011   CT Lung Screen - not appropriate age  No orders of the defined types were placed in this encounter.  Meds ordered this encounter  Medications   predniSONE (DELTASONE) 10 MG tablet    Sig: Take 4 tablets (40 mg total) by mouth daily with breakfast for 2 days, THEN 3 tablets (30 mg total) daily with breakfast for 2 days, THEN 2 tablets (20 mg total) daily with breakfast for 2 days, THEN 1 tablet (10 mg total) daily with breakfast for 2 days.    Dispense:  20 tablet    Refill:  0   Fluticasone-Umeclidin-Vilant (TRELEGY ELLIPTA) 100-62.5-25 MCG/ACT AEPB    Sig: Inhale 1 puff into the lungs daily.    Dispense:  60 each    Refill:  5    Return in about 4 months (around 11/22/2023).  I have spent a total time of 30-minutes on the  day of the appointment including chart review, data review, collecting history, coordinating care and discussing medical diagnosis and plan with the patient/family. Past medical history, allergies, medications were reviewed. Pertinent imaging, labs and tests included in this note have been reviewed and interpreted independently by me.  Amylee Lodato Mechele Collin, MD [facility] 07/22/2023 2:20 PM  Office Number [phone]

## 2023-07-24 ENCOUNTER — Other Ambulatory Visit: Payer: Self-pay | Admitting: Medical-Surgical

## 2023-08-08 ENCOUNTER — Other Ambulatory Visit: Payer: Self-pay | Admitting: Medical-Surgical

## 2023-08-08 DIAGNOSIS — J302 Other seasonal allergic rhinitis: Secondary | ICD-10-CM

## 2023-08-08 NOTE — Telephone Encounter (Signed)
Upcoming appointment 08/12/2023

## 2023-08-12 ENCOUNTER — Encounter: Payer: Self-pay | Admitting: Medical-Surgical

## 2023-08-12 ENCOUNTER — Ambulatory Visit (INDEPENDENT_AMBULATORY_CARE_PROVIDER_SITE_OTHER): Payer: PPO | Admitting: Medical-Surgical

## 2023-08-12 VITALS — BP 151/79 | HR 78 | Resp 20 | Ht 66.0 in | Wt 173.0 lb

## 2023-08-12 DIAGNOSIS — J432 Centrilobular emphysema: Secondary | ICD-10-CM

## 2023-08-12 DIAGNOSIS — E559 Vitamin D deficiency, unspecified: Secondary | ICD-10-CM

## 2023-08-12 DIAGNOSIS — E890 Postprocedural hypothyroidism: Secondary | ICD-10-CM

## 2023-08-12 DIAGNOSIS — F419 Anxiety disorder, unspecified: Secondary | ICD-10-CM | POA: Diagnosis not present

## 2023-08-12 NOTE — Progress Notes (Unsigned)
        Established patient visit  History, exam, impression, and plan:  1. Centrilobular emphysema (HCC) Very pleasant 82 year old female presenting today with reports of continued issues with COPD/emphysema.  She has a pulmonologist and saw them recently for continued respiratory infection issues.  She was not thrilled with the experience at that appointment and is requesting a referral to a different pulmonologist.  She was given prednisone which she completed however her symptoms remain.  She is still having difficulty with shortness of breath, productive cough, and generalized malaise.  Taking Mucinex twice daily and ibuprofen every 4-6 hours as needed.  On exam, she does have decreased airflow and scattered wheezing noted.  She is currently using the lower dose of Trelegy which previously kept her controlled.  Unfortunately, it is not working as well as it did.  A 14-day sample given of the higher dose Trelegy today with instructions to try this for a couple of weeks and let me know if it is beneficial for her symptoms.  Referral placed to new pulmonologist as requested.  2. Postoperative hypothyroidism Recent TSH reviewed.  TSH levels are normal without current medication therapy.  Plan to continue monitoring.  3. Vitamin D deficiency She does have a history of vitamin D deficiency and is currently taking oral supplementation daily.  She had some recent lab work but this was not checked.  We will defer for now but plan to check with next labs.  4. Anxiety Has a history of anxiety which flares at times especially when her respiratory status changes.  Not currently on any medications to manage her mood.  Previously used Xanax on an as-needed basis but no current prescription.  Denies SI/HI.  No changes today.   Procedures performed this visit: None.  Return in about 6 months (around 02/09/2024) for chronic disease follow up.  __________________________________ Thayer Ohm, DNP, APRN,  FNP-BC Primary Care and Sports Medicine [facility]

## 2023-08-16 ENCOUNTER — Other Ambulatory Visit: Payer: Self-pay | Admitting: Medical-Surgical

## 2023-08-16 DIAGNOSIS — Z1231 Encounter for screening mammogram for malignant neoplasm of breast: Secondary | ICD-10-CM

## 2023-08-27 ENCOUNTER — Ambulatory Visit: Payer: PPO

## 2023-08-27 ENCOUNTER — Ambulatory Visit
Admission: RE | Admit: 2023-08-27 | Discharge: 2023-08-27 | Disposition: A | Discharge status: Home / Self Care | Payer: PPO | Source: Ambulatory Visit | Attending: Family Medicine | Admitting: Family Medicine

## 2023-08-27 VITALS — BP 154/88 | HR 89 | Temp 98.1°F | Resp 24

## 2023-08-27 DIAGNOSIS — R0602 Shortness of breath: Secondary | ICD-10-CM | POA: Diagnosis not present

## 2023-08-27 DIAGNOSIS — R042 Hemoptysis: Secondary | ICD-10-CM

## 2023-08-27 DIAGNOSIS — J441 Chronic obstructive pulmonary disease with (acute) exacerbation: Secondary | ICD-10-CM | POA: Diagnosis not present

## 2023-08-27 DIAGNOSIS — R600 Localized edema: Secondary | ICD-10-CM

## 2023-08-27 DIAGNOSIS — J9811 Atelectasis: Secondary | ICD-10-CM

## 2023-08-27 DIAGNOSIS — I700 Atherosclerosis of aorta: Secondary | ICD-10-CM | POA: Diagnosis not present

## 2023-08-27 MED ORDER — FUROSEMIDE 20 MG PO TABS: 20.0000 mg | ORAL_TABLET | Freq: Every day | ORAL | 0 refills | Status: DC

## 2023-08-27 NOTE — ED Triage Notes (Addendum)
Pt c/o cough since 10/06. Was seen in ED. Tx with abx and steroids. Also taking mucinex prn. None today. Also does nebulizer and inhaler prn. Says she's been taking 600mg -800mg  q4-6hrs since 10/16. Around 4p she coughed up some red sputum. Says her throat felt very raw yesterday but she's never coughed up blood before.

## 2023-08-27 NOTE — Discharge Instructions (Signed)
Furosemide (Lasix) is a fluid pill. You may pick it up tomorrow, take 1 a day for 3 days only The should help take care of the extra fluid you have now If the fluid comes back, you may need more Lasix.  Additional Lasix should come from [facility]. Continue your usual inhalers and breathing medications

## 2023-08-27 NOTE — ED Provider Notes (Signed)
[facility]    CSN: [id number] Arrival date & time: 08/27/23  1758      History   Chief Complaint Chief Complaint  Patient presents with   Cough    HPI Christine Barry is a 82 y.o. female.   Very complicated 82 year old woman with multiple medical problems.  She has COPD and asthma, is on multiple inhalers, and has had a lot of difficulty with cough and shortness of breath ever since her hospitalization of 07/14/2023.  She states that she has brief periods of improvement, usually when she is on steroids, and then the symptoms come back again.   She is here today because she coughed up blood.  She states there was a pink tinge to the sputum and she brings me a sample and a Ziploc bag.  It is clearly bloody.  She also states that she has increased swelling in her ankles and feels "bloated".  No chest pain.  No history of any heart disease.  She has shortness of breath but states that it is stable over the last month and a half.    Past Medical History:  Diagnosis Date   Allergy 1964   Anxiety 1986   Arthritis    Asthma    COPD (chronic obstructive pulmonary disease) (HCC)    Emphysema of lung (HCC) 2016   Hyperlipidemia    Thyroid disease     Patient Active Problem List   Diagnosis Date Noted   Left shoulder pain 06/06/2023   Weight gain 06/08/2022   Grief at loss of child 06/08/2022   Postoperative hypothyroidism 03/08/2021   Centrilobular emphysema (HCC) 03/08/2021   Moderate persistent asthma without complication 03/08/2021   Osteoporosis screening 03/08/2021   Right hip pain 08/23/2020   Anxiety 07/23/2019   Diverticulosis 07/23/2019   Elevated cholesterol 07/23/2019   History of nicotine dependence 07/23/2019   Hyperplastic colon polyp 07/23/2019   Increased glucose level 07/23/2019   Osteoarthritis 07/23/2019   Redundant colon 07/23/2019   Seasonal allergic rhinitis 07/23/2019   Sensorineural hearing loss (SNHL) of both ears 07/23/2019    Vitamin D deficiency 07/23/2019   Xerosis of skin 07/23/2019   Chronic obstructive pulmonary disease (HCC) 07/23/2019   Pseudoptosis 09/16/2013   Dermatochalasis 09/16/2013    Past Surgical History:  Procedure Laterality Date   CESAREAN SECTION     EYE SURGERY  2018   Cataracts    OB History   No obstetric history on file.      Home Medications    Prior to Admission medications   Medication Sig Start Date End Date Taking? Authorizing Provider  EUTHYROX 200 MCG tablet Take by mouth. 08/15/23  Yes [provider]  furosemide (LASIX) 20 MG tablet Take 1 tablet (20 mg total) by mouth daily. 08/27/23  Yes Eustace Moore, MD  albuterol (PROVENTIL) (2.5 MG/3ML) 0.083% nebulizer solution Take 3 mLs (2.5 mg total) by nebulization every 6 (six) hours as needed for wheezing or shortness of breath. 07/16/23   Christen Butter, NP  albuterol (VENTOLIN HFA) 108 (90 Base) MCG/ACT inhaler Inhale 2 puffs into the lungs every 6 (six) hours as needed for wheezing or shortness of breath. 05/15/22   Luciano Cutter, MD  Albuterol-Budesonide [facility]) 90-80 MCG/ACT AERO Inhale 2 puffs into the lungs every 6 (six) hours as needed. 07/16/23   Christen Butter, NP  ALPRAZolam Prudy Feeler) 0.25 MG tablet Take 0.25 mg by mouth at bedtime as needed for anxiety.    [provider]  cetirizine (ZYRTEC) 10 MG tablet Take 10 mg by mouth daily as needed.    [provider]  Cholecalciferol 50 MCG (2000 UT) TABS Take 2,000 Units by mouth daily. 04/29/17   [provider]  cyanocobalamin 1000 MCG tablet Take 1,000 mcg by mouth 3 (three) times a week. 11/01/15   [provider]  cyclobenzaprine (FLEXERIL) 10 MG tablet Take 0.5-1 tablets (5-10 mg total) by mouth 3 (three) times daily as needed for muscle spasms. Caution: can cause drowsiness 05/14/23   Christen Butter, NP  Ferrous Sulfate (IRON PO) Take by mouth. Nature made    [provider]  finasteride (PROSCAR) 5 MG tablet Take 5  mg by mouth daily. 03/27/22   [provider]  Fluticasone-Umeclidin-Vilant (TRELEGY ELLIPTA) 100-62.5-25 MCG/ACT AEPB Inhale 1 puff into the lungs daily. 07/22/23   Luciano Cutter, MD  montelukast (SINGULAIR) 10 MG tablet TAKE 1 TABLET BY MOUTH AT BEDTIME 08/08/23   Christen Butter, NP  simvastatin (ZOCOR) 40 MG tablet TAKE 1 TABLET BY MOUTH AT BEDTIME 07/25/23   Christen Butter, NP    Family History Family History  Problem Relation Age of Onset   Lung disease Father    Early death Father    Anxiety disorder Mother    Arthritis Mother    Anxiety disorder Son    Arthritis Sister    Arthritis Sister     Social History Social History   Tobacco Use   Smoking status: Former    Current packs/day: 0.00    Average packs/day: 2.0 packs/day for 38.7 years (77.5 ttl pk-yrs)    Types: Cigarettes    Start date: 10/08/1961    Quit date: 07/07/2000    Years since quitting: 23.1   Smokeless tobacco: Never  Vaping Use   Vaping status: Never Used  Substance Use Topics   Alcohol use: Yes    Comment: occasional   Drug use: Never     Allergies   Penicillins and Sulfa antibiotics   Review of Systems Review of Systems   Physical Exam Triage Vital Signs ED Triage Vitals  Encounter Vitals Group     BP 08/27/23 1816 (!) 198/89     Systolic BP Percentile -- See repeat     Diastolic BP Percentile --      Pulse Rate 08/27/23 1900 89     Resp 08/27/23 1900 20     Temp 08/27/23 1816 98.1 F (36.7 C)     Temp src --      SpO2 08/27/23 1900 92 %     Weight --      Height --      Head Circumference --      Peak Flow --      Pain Score --      Pain Loc --      Pain Education --      Exclude from Growth Chart --    No data found.  Updated Vital Signs BP (!) 154/88 (BP Location: Right Arm)   Pulse 89   Temp 98.1 F (36.7 C)   Resp 20   SpO2 92%      Physical Exam Constitutional:      General: She is not in acute distress.    Appearance: She is well-developed and normal  weight.     Comments: Short of breath at rest.  Difficulty finishing a sentence  HENT:     Head: Normocephalic and atraumatic.  Eyes:     Conjunctiva/sclera: Conjunctivae normal.  Pupils: Pupils are equal, round, and reactive to light.  Cardiovascular:     Rate and Rhythm: Normal rate.     Heart sounds: Normal heart sounds.     Comments: Heart sounds slightly distant.  Do not appreciate gallop. Pulmonary:     Effort: Pulmonary effort is normal. No respiratory distress.     Breath sounds: Normal breath sounds.  Abdominal:     General: There is no distension.     Palpations: Abdomen is soft.  Musculoskeletal:        General: Normal range of motion.     Cervical back: Normal range of motion.     Right lower leg: Edema present.     Left lower leg: Edema present.  Skin:    General: Skin is warm and dry.  Neurological:     General: No focal deficit present.     Mental Status: She is alert.      UC Treatments / Results  Labs (all labs ordered are listed, but only abnormal results are displayed) Labs Reviewed - No data to display  EKG   Radiology DG Chest 2 View  Result Date: 08/27/2023 CLINICAL DATA:  Hemoptysis EXAM: CHEST - 2 VIEW COMPARISON:  07/14/2023 FINDINGS: Bibasilar opacities, likely atelectasis. No effusions. Heart and mediastinal contours are within normal limits. Aortic atherosclerosis. IMPRESSION: Bibasilar atelectasis. Electronically Signed   By: Charlett Nose M.D.   On: 08/27/2023 19:21    Procedures Procedures (including critical care time)  Medications Ordered in UC Medications - No data to display  Initial Impression / Assessment and Plan / UC Course  I have reviewed the triage vital signs and the nursing notes.  Pertinent labs & imaging results that were available during my care of the patient were reviewed by me and considered in my medical decision making (see chart for details).     I had concern for congestive heart failure given her pedal  edema and pink sputum.  Her shortness of breath appears to be from the COPD.  I have advised her to follow-up with her pulmonary doctors and primary care.  Will give her a trial of Lasix to see if it improves her symptoms and edema. Final Clinical Impressions(s) / UC Diagnoses   Final diagnoses:  Pedal edema  SOB (shortness of breath)  Hemoptysis  COPD exacerbation (HCC)     Discharge Instructions      Furosemide (Lasix) is a fluid pill. You may pick it up tomorrow, take 1 a day for 3 days only The should help take care of the extra fluid you have now If the fluid comes back, you may need more Lasix.  Additional Lasix should come from [facility]. Continue your usual inhalers and breathing medications   ED Prescriptions     Medication Sig Dispense Auth. Provider   furosemide (LASIX) 20 MG tablet Take 1 tablet (20 mg total) by mouth daily. 10 tablet Eustace Moore, MD      PDMP not reviewed this encounter.   Eustace Moore, MD 08/27/23 [phone]

## 2023-08-29 ENCOUNTER — Telehealth: Payer: PPO | Admitting: Medical-Surgical

## 2023-08-31 ENCOUNTER — Other Ambulatory Visit: Payer: Self-pay | Admitting: Medical-Surgical

## 2023-09-02 ENCOUNTER — Encounter: Payer: Self-pay | Admitting: Medical-Surgical

## 2023-09-02 ENCOUNTER — Other Ambulatory Visit: Payer: Self-pay

## 2023-09-02 ENCOUNTER — Telehealth: Payer: Self-pay

## 2023-09-02 MED ORDER — ALPRAZOLAM 0.25 MG PO TABS: 0.2500 mg | ORAL_TABLET | Freq: Every evening | ORAL | 0 refills | Status: DC | PRN

## 2023-09-02 MED ORDER — ALBUTEROL SULFATE HFA 108 (90 BASE) MCG/ACT IN AERS: 2.0000 | INHALATION_SPRAY | Freq: Four times a day (QID) | RESPIRATORY_TRACT | 1 refills | Status: AC | PRN

## 2023-09-02 NOTE — Telephone Encounter (Signed)
See telephone message. This message has been resolved.

## 2023-09-02 NOTE — Telephone Encounter (Signed)
Copied from CRM [id number]. Topic: Clinical - Medical Advice >> Sep 02, 2023 12:04 PM Donita Brooks wrote: Reason for CRM: Pt went [facility] on [facility], Dr. Fannie Knee gave her Furosemide- 20mg  for 3 days. Dr. Fannie Knee advise she call her provider. Pt wants to know if she should continue to use the Furosemide- 20mg 

## 2023-09-02 NOTE — Telephone Encounter (Signed)
Christine Barry also sent a [employer]. It has been sent to the covering provider. Ander Slade is out of the office this week.

## 2023-09-02 NOTE — Telephone Encounter (Signed)
I sent message via another portal to you.

## 2023-09-02 NOTE — Telephone Encounter (Signed)
Patient advised and scheduled.

## 2023-09-02 NOTE — Telephone Encounter (Signed)
Ok to take daily until swelling is gone or out of your 10 pills given. I would get back in here this week or early next week for follow up on edema and see if anything else is needed.

## 2023-09-09 ENCOUNTER — Ambulatory Visit (INDEPENDENT_AMBULATORY_CARE_PROVIDER_SITE_OTHER): Payer: PPO | Admitting: Physician Assistant

## 2023-09-09 ENCOUNTER — Encounter: Payer: Self-pay | Admitting: Physician Assistant

## 2023-09-09 VITALS — BP 109/65 | HR 92 | Ht 66.0 in | Wt 173.0 lb

## 2023-09-09 DIAGNOSIS — R600 Localized edema: Secondary | ICD-10-CM

## 2023-09-09 DIAGNOSIS — R0981 Nasal congestion: Secondary | ICD-10-CM

## 2023-09-09 DIAGNOSIS — J9811 Atelectasis: Secondary | ICD-10-CM | POA: Diagnosis not present

## 2023-09-09 DIAGNOSIS — J432 Centrilobular emphysema: Secondary | ICD-10-CM | POA: Diagnosis not present

## 2023-09-09 DIAGNOSIS — R809 Proteinuria, unspecified: Secondary | ICD-10-CM | POA: Diagnosis not present

## 2023-09-09 DIAGNOSIS — R0602 Shortness of breath: Secondary | ICD-10-CM | POA: Diagnosis not present

## 2023-09-09 DIAGNOSIS — J441 Chronic obstructive pulmonary disease with (acute) exacerbation: Secondary | ICD-10-CM | POA: Diagnosis not present

## 2023-09-09 DIAGNOSIS — R053 Chronic cough: Secondary | ICD-10-CM | POA: Diagnosis not present

## 2023-09-09 LAB — POCT UA - MICROALBUMIN
Creatinine, POC: 50 mg/dL
Microalbumin Ur, POC: 30 mg/L

## 2023-09-09 MED ORDER — PREDNISONE 20 MG PO TABS: ORAL_TABLET | ORAL | 0 refills | Status: DC

## 2023-09-09 MED ORDER — AZITHROMYCIN 250 MG PO TABS: ORAL_TABLET | ORAL | 0 refills | Status: DC

## 2023-09-09 NOTE — Progress Notes (Unsigned)
Acute Office Visit  Subjective:     Patient ID: Christine Barry, female    DOB: 12/25/1940, 82 y.o.   MRN: [id number]  Chief Complaint  Patient presents with   Hospitalization Follow-up    Bilateral ankle swelling onset 2 weeks, pt was given lasiks to take     HPI Patient is in today for persistent worsening cough, SOB and bilateral ankle swelling for 2 wks.  She was seen in UC on 11/19. She was given Lasix for the swelling. CXR showed bibasilar atelectasis. PMH COPD. Swelling is better today.   She also is experiencing a wet cough and SOB. She is coughing up clear phlegm. She denies fever. She had a COPD exacerbation on 10/6 and went to the ED who got a CXR and EKG. EKG showed sinus rhythm and probable left atrial enlargement. CXR showed no acute cardiopulmonary disease. She was given doxy and prednisone. She went back to the ER on 10/14 for persistent symptoms and was given another prednisone taper which helped. She does not use O2. She has not had prednisone or abx since October.   Patient also reports constant nasal and sinus congestion. She has tried flonase, atrovent, and astelin.   .. Active Ambulatory Problems    Diagnosis Date Noted   Postoperative hypothyroidism 03/08/2021   Centrilobular emphysema (HCC) 03/08/2021   Moderate persistent asthma without complication 03/08/2021   Osteoporosis screening 03/08/2021   Anxiety 07/23/2019   Diverticulosis 07/23/2019   Elevated cholesterol 07/23/2019   History of nicotine dependence 07/23/2019   Hyperplastic colon polyp 07/23/2019   Increased glucose level 07/23/2019   Osteoarthritis 07/23/2019   Pseudoptosis 09/16/2013   Redundant colon 07/23/2019   Right hip pain 08/23/2020   Seasonal allergic rhinitis 07/23/2019   Sensorineural hearing loss (SNHL) of both ears 07/23/2019   Dermatochalasis 09/16/2013   Vitamin D deficiency 07/23/2019   Xerosis of skin 07/23/2019   Chronic obstructive pulmonary disease (HCC)  07/23/2019   Weight gain 06/08/2022   Grief at loss of child 06/08/2022   Left shoulder pain 06/06/2023   Atelectasis 09/09/2023   Pedal edema 09/10/2023   Chronic cough 09/10/2023   SOB (shortness of breath) 09/10/2023   Chronic nasal congestion 09/10/2023   Microalbuminuria 09/10/2023   Resolved Ambulatory Problems    Diagnosis Date Noted   No Resolved Ambulatory Problems   Past Medical History:  Diagnosis Date   Allergy 1964   Arthritis    Asthma    COPD (chronic obstructive pulmonary disease) (HCC)    Emphysema of lung (HCC) 2016   Hyperlipidemia    Thyroid disease      Review of Systems  Constitutional:  Positive for chills. Negative for fever.  Respiratory:  Positive for cough and shortness of breath.   Cardiovascular:  Positive for leg swelling.      Objective:    BP 109/65   Pulse 92   Ht 5\' 6"  (1.676 m)   Wt 173 lb (78.5 kg)   SpO2 92%   BMI 27.92 kg/m    Physical Exam Constitutional:      Appearance: Normal appearance.  HENT:     Head: Normocephalic.  Cardiovascular:     Rate and Rhythm: Normal rate and regular rhythm.     Pulses: Normal pulses.     Heart sounds: Normal heart sounds.  Pulmonary:     Effort: No respiratory distress.     Breath sounds: Examination of the right-upper field reveals decreased breath sounds. Examination of the  left-upper field reveals decreased breath sounds. Examination of the right-lower field reveals rhonchi. Examination of the left-lower field reveals rhonchi. Decreased breath sounds and rhonchi present.  Musculoskeletal:     Comments: Scant pedal edema bilaterally.   Neurological:     General: No focal deficit present.     Mental Status: She is alert and oriented to person, place, and time.  Psychiatric:        Mood and Affect: Mood normal.         Assessment & Plan:  Marland KitchenMarland KitchenMizani was seen today for hospitalization follow-up.  Diagnoses and all orders for this visit:  COPD exacerbation (HCC) -      predniSONE (DELTASONE) 20 MG tablet; Take 3 tablets for 3 days, take 2 tablets for 3 days, take 1 tablet for 3 days, take 1/2 tablet for 4 days. -     azithromycin (ZITHROMAX Z-PAK) 250 MG tablet; Take 2 tablets (500 mg) on  Day 1,  followed by 1 tablet (250 mg) once daily on Days 2 through 5. -     CT Chest Wo Contrast; Future  Atelectasis -     CMP14+EGFR -     Brain natriuretic peptide -     POCT UA - Microalbumin -     predniSONE (DELTASONE) 20 MG tablet; Take 3 tablets for 3 days, take 2 tablets for 3 days, take 1 tablet for 3 days, take 1/2 tablet for 4 days. -     CT Chest Wo Contrast; Future  Centrilobular emphysema (HCC) -     CMP14+EGFR -     Brain natriuretic peptide -     POCT UA - Microalbumin -     CT Chest Wo Contrast; Future  Chronic cough -     CMP14+EGFR -     Brain natriuretic peptide -     POCT UA - Microalbumin -     predniSONE (DELTASONE) 20 MG tablet; Take 3 tablets for 3 days, take 2 tablets for 3 days, take 1 tablet for 3 days, take 1/2 tablet for 4 days. -     CT Chest Wo Contrast; Future -     Ambulatory referral to Allergy  Pedal edema -     CMP14+EGFR -     Brain natriuretic peptide -     POCT UA - Microalbumin -     furosemide (LASIX) 20 MG tablet; Take one tablet daily has needed for lower extremity edema. -     ECHOCARDIOGRAM COMPLETE; Future -     Cardiac Stress Test: Informed Consent Details: Physician/Practitioner Attestation; Transcribe to consent form and obtain patient signature  SOB (shortness of breath) -     CMP14+EGFR -     Brain natriuretic peptide -     POCT UA - Microalbumin -     CT Chest Wo Contrast; Future -     furosemide (LASIX) 20 MG tablet; Take one tablet daily has needed for lower extremity edema. -     ECHOCARDIOGRAM COMPLETE; Future -     Cardiac Stress Test: Informed Consent Details: Physician/Practitioner Attestation; Transcribe to consent form and obtain patient signature  Chronic nasal congestion -     Ambulatory  referral to Allergy  Microalbuminuria    Advised patient to use Lasix PRN for more severe leg swelling. Ordered BNP, CMP, urine microalbumin to check for heart and kidney function. Abnormal protein range for microalbumin-will check kidney function.   Ordered echo due to PMH of COPD and leg swelling. Ordered chest  CT due to persistent COPD exacerbation symptoms. Prescribed prednisone taper and Z-Pak for COPD exacerbation.  Consider allergist for chronic congestion. Failed flonase/atrovent/astelin.     Tandy Gaw, PA-C

## 2023-09-09 NOTE — Patient Instructions (Signed)
Get CT of chest and echo of heart

## 2023-09-10 ENCOUNTER — Encounter: Payer: Self-pay | Admitting: Physician Assistant

## 2023-09-10 DIAGNOSIS — R053 Chronic cough: Secondary | ICD-10-CM | POA: Insufficient documentation

## 2023-09-10 DIAGNOSIS — R0602 Shortness of breath: Secondary | ICD-10-CM | POA: Insufficient documentation

## 2023-09-10 DIAGNOSIS — R600 Localized edema: Secondary | ICD-10-CM | POA: Insufficient documentation

## 2023-09-10 DIAGNOSIS — R0981 Nasal congestion: Secondary | ICD-10-CM | POA: Insufficient documentation

## 2023-09-10 DIAGNOSIS — R809 Proteinuria, unspecified: Secondary | ICD-10-CM | POA: Insufficient documentation

## 2023-09-10 LAB — CMP14+EGFR
ALT: 19 [IU]/L (ref 0–32)
AST: 23 [IU]/L (ref 0–40)
Albumin: 4.4 g/dL (ref 3.8–4.8)
Alkaline Phosphatase: 71 [IU]/L (ref 44–121)
BUN/Creatinine Ratio: 23 (ref 12–28)
BUN: 19 mg/dL (ref 8–27)
Bilirubin Total: 0.3 mg/dL (ref 0.0–1.2)
CO2: 23 mmol/L (ref 20–29)
Calcium: 9.2 mg/dL (ref 8.7–10.3)
Chloride: 100 mmol/L (ref 96–106)
Creatinine, Ser: 0.83 mg/dL (ref 0.57–1.00)
Globulin, Total: 2.3 g/dL (ref 1.5–4.5)
Glucose: 88 mg/dL (ref 70–99)
Potassium: 4.4 mmol/L (ref 3.5–5.2)
Sodium: 140 mmol/L (ref 134–144)
Total Protein: 6.7 g/dL (ref 6.0–8.5)
eGFR: 71 mL/min/{1.73_m2} (ref >59)

## 2023-09-10 MED ORDER — FUROSEMIDE 20 MG PO TABS: ORAL_TABLET | ORAL | 0 refills | Status: DC

## 2023-09-10 NOTE — Progress Notes (Signed)
Anevay,   Kidney, liver, glucose look good.  You did have some slightly abnormal protein in urine. This can be precursor to kidney damage and we at times start medication for this. Your BP is great and even on low side so we need to be cautious. Follow up with Joy in next few weeks to address this. CT and echo have been ordered and you should be called soon.

## 2023-09-11 ENCOUNTER — Other Ambulatory Visit: Payer: PPO

## 2023-09-11 DIAGNOSIS — R053 Chronic cough: Secondary | ICD-10-CM | POA: Diagnosis not present

## 2023-09-11 DIAGNOSIS — R0602 Shortness of breath: Secondary | ICD-10-CM | POA: Diagnosis not present

## 2023-09-11 DIAGNOSIS — J9811 Atelectasis: Secondary | ICD-10-CM

## 2023-09-11 DIAGNOSIS — J441 Chronic obstructive pulmonary disease with (acute) exacerbation: Secondary | ICD-10-CM

## 2023-09-11 DIAGNOSIS — J432 Centrilobular emphysema: Secondary | ICD-10-CM | POA: Diagnosis not present

## 2023-09-11 DIAGNOSIS — R918 Other nonspecific abnormal finding of lung field: Secondary | ICD-10-CM | POA: Diagnosis not present

## 2023-09-18 DIAGNOSIS — J300 Vasomotor rhinitis: Secondary | ICD-10-CM | POA: Diagnosis not present

## 2023-09-18 DIAGNOSIS — R600 Localized edema: Secondary | ICD-10-CM | POA: Diagnosis not present

## 2023-09-18 DIAGNOSIS — J309 Allergic rhinitis, unspecified: Secondary | ICD-10-CM | POA: Diagnosis not present

## 2023-09-18 DIAGNOSIS — J449 Chronic obstructive pulmonary disease, unspecified: Secondary | ICD-10-CM | POA: Diagnosis not present

## 2023-09-19 DIAGNOSIS — J309 Allergic rhinitis, unspecified: Secondary | ICD-10-CM | POA: Diagnosis not present

## 2023-09-20 ENCOUNTER — Telehealth: Payer: Self-pay

## 2023-09-20 DIAGNOSIS — J441 Chronic obstructive pulmonary disease with (acute) exacerbation: Secondary | ICD-10-CM

## 2023-09-20 NOTE — Telephone Encounter (Signed)
Referral placed.

## 2023-09-20 NOTE — Telephone Encounter (Signed)
Copied from CRM [id number]. Topic: Referral - Request for Referral >> Sep 19, 2023 12:23 PM Prudencio Pair wrote: Did the patient discuss referral with their provider in the last year? Yes (If No - schedule appointment) (If Yes - send message)  Appointment offered? No  Type of order/referral and detailed reason for visit: Pulmonary Referral  Preference of office, provider, location: Dr. Heber [place] at [facility] in [place], [place]     Fax #: [phone]  If referral order, have you been seen by this specialty before? No (If Yes, this issue or another issue? When? Where?  Can we respond through MyChart? No

## 2023-09-20 NOTE — Telephone Encounter (Signed)
Patient informed. 

## 2023-09-20 NOTE — Telephone Encounter (Signed)
 Attempted call to patient. Left a voice mail message  to return our call.

## 2023-09-20 NOTE — Telephone Encounter (Signed)
Referral, clinical notes, demographics and copies of insurance cards have been faxed to [facility] at [phone]. Office will contact patient to schedule referral appointment.

## 2023-09-23 ENCOUNTER — Encounter: Payer: Self-pay | Admitting: Physician Assistant

## 2023-09-23 DIAGNOSIS — R918 Other nonspecific abnormal finding of lung field: Secondary | ICD-10-CM | POA: Insufficient documentation

## 2023-09-23 NOTE — Progress Notes (Signed)
No acute abnormality of chest.  Pulmonary nodules seen, largest measures 6mm.  Repeat CT in 3-6 months.   Forward to PCP as FYI.

## 2023-09-26 ENCOUNTER — Ambulatory Visit: Payer: PPO

## 2023-09-26 DIAGNOSIS — Z1231 Encounter for screening mammogram for malignant neoplasm of breast: Secondary | ICD-10-CM

## 2023-09-27 DIAGNOSIS — J9611 Chronic respiratory failure with hypoxia: Secondary | ICD-10-CM | POA: Diagnosis not present

## 2023-09-27 DIAGNOSIS — J432 Centrilobular emphysema: Secondary | ICD-10-CM | POA: Diagnosis not present

## 2023-09-27 DIAGNOSIS — Z23 Encounter for immunization: Secondary | ICD-10-CM | POA: Diagnosis not present

## 2023-09-27 DIAGNOSIS — R918 Other nonspecific abnormal finding of lung field: Secondary | ICD-10-CM | POA: Diagnosis not present

## 2023-09-30 DIAGNOSIS — J432 Centrilobular emphysema: Secondary | ICD-10-CM | POA: Diagnosis not present

## 2023-10-07 DIAGNOSIS — L649 Androgenic alopecia, unspecified: Secondary | ICD-10-CM | POA: Diagnosis not present

## 2023-10-10 ENCOUNTER — Ambulatory Visit (HOSPITAL_BASED_OUTPATIENT_CLINIC_OR_DEPARTMENT_OTHER)
Admission: RE | Admit: 2023-10-10 | Discharge: 2023-10-10 | Disposition: A | Discharge status: Home / Self Care | Payer: PPO | Source: Ambulatory Visit | Attending: Physician Assistant | Admitting: Physician Assistant

## 2023-10-10 ENCOUNTER — Other Ambulatory Visit (HOSPITAL_BASED_OUTPATIENT_CLINIC_OR_DEPARTMENT_OTHER): Payer: Self-pay

## 2023-10-10 DIAGNOSIS — R0602 Shortness of breath: Secondary | ICD-10-CM | POA: Insufficient documentation

## 2023-10-10 DIAGNOSIS — R600 Localized edema: Secondary | ICD-10-CM | POA: Diagnosis not present

## 2023-10-10 LAB — ECHOCARDIOGRAM COMPLETE
AR max vel: 2.22 cm2
AV Area VTI: 2.54 cm2
AV Area mean vel: 2.28 cm2
AV Mean grad: 4 mm[Hg]
AV Peak grad: 8.6 mm[Hg]
AV Vena cont: 0.2 cm
Ao pk vel: 1.47 m/s
Area-P 1/2: 3.02 cm2
Calc EF: 62.6 %
MV M vel: 2.74 m/s
MV Peak grad: 30 mm[Hg]
P 1/2 time: 1475 ms
S' Lateral: 2.3 cm
Single Plane A2C EF: 64.6 %
Single Plane A4C EF: 60.8 %

## 2023-10-10 MED ORDER — COMIRNATY 30 MCG/0.3ML IM SUSY
0.3000 mL | PREFILLED_SYRINGE | Freq: Once | INTRAMUSCULAR | 0 refills | Status: AC
  Filled 2023-10-10: qty 0.3, 1d supply, fill #0

## 2023-10-11 NOTE — Progress Notes (Signed)
 Synetta Fail,   Good Ejection Fraction.  Valves look good.  Echo looks good.  No concerns.

## 2023-10-19 ENCOUNTER — Other Ambulatory Visit: Payer: Self-pay | Admitting: Medical-Surgical

## 2023-10-29 ENCOUNTER — Institutional Professional Consult (permissible substitution): Payer: PPO | Admitting: Pulmonary Disease

## 2023-10-29 ENCOUNTER — Other Ambulatory Visit: Payer: Self-pay | Admitting: Physician Assistant

## 2023-10-29 DIAGNOSIS — R0602 Shortness of breath: Secondary | ICD-10-CM

## 2023-10-29 DIAGNOSIS — R600 Localized edema: Secondary | ICD-10-CM

## 2023-11-05 DIAGNOSIS — J432 Centrilobular emphysema: Secondary | ICD-10-CM | POA: Diagnosis not present

## 2023-11-05 DIAGNOSIS — J9611 Chronic respiratory failure with hypoxia: Secondary | ICD-10-CM | POA: Diagnosis not present

## 2023-11-05 DIAGNOSIS — R918 Other nonspecific abnormal finding of lung field: Secondary | ICD-10-CM | POA: Diagnosis not present

## 2023-11-11 ENCOUNTER — Other Ambulatory Visit: Payer: Self-pay | Admitting: Medical-Surgical

## 2023-11-11 DIAGNOSIS — J302 Other seasonal allergic rhinitis: Secondary | ICD-10-CM

## 2023-11-13 DIAGNOSIS — R600 Localized edema: Secondary | ICD-10-CM | POA: Insufficient documentation

## 2024-01-16 ENCOUNTER — Other Ambulatory Visit: Payer: Self-pay | Admitting: Medical-Surgical

## 2024-02-09 ENCOUNTER — Other Ambulatory Visit: Payer: Self-pay | Admitting: Medical-Surgical

## 2024-02-09 DIAGNOSIS — M25551 Pain in right hip: Secondary | ICD-10-CM

## 2024-02-09 DIAGNOSIS — E890 Postprocedural hypothyroidism: Secondary | ICD-10-CM

## 2024-02-10 ENCOUNTER — Ambulatory Visit (INDEPENDENT_AMBULATORY_CARE_PROVIDER_SITE_OTHER): Payer: PPO | Admitting: Medical-Surgical

## 2024-02-10 ENCOUNTER — Encounter: Payer: Self-pay | Admitting: Medical-Surgical

## 2024-02-10 VITALS — BP 115/71 | HR 78 | Resp 20 | Ht 66.0 in | Wt 173.0 lb

## 2024-02-10 DIAGNOSIS — E890 Postprocedural hypothyroidism: Secondary | ICD-10-CM

## 2024-02-10 DIAGNOSIS — J302 Other seasonal allergic rhinitis: Secondary | ICD-10-CM

## 2024-02-10 DIAGNOSIS — R918 Other nonspecific abnormal finding of lung field: Secondary | ICD-10-CM

## 2024-02-10 DIAGNOSIS — R350 Frequency of micturition: Secondary | ICD-10-CM | POA: Diagnosis not present

## 2024-02-10 DIAGNOSIS — Z13820 Encounter for screening for osteoporosis: Secondary | ICD-10-CM | POA: Diagnosis not present

## 2024-02-10 DIAGNOSIS — R7303 Prediabetes: Secondary | ICD-10-CM | POA: Diagnosis not present

## 2024-02-10 DIAGNOSIS — E7800 Pure hypercholesterolemia, unspecified: Secondary | ICD-10-CM

## 2024-02-10 DIAGNOSIS — F419 Anxiety disorder, unspecified: Secondary | ICD-10-CM

## 2024-02-10 DIAGNOSIS — J432 Centrilobular emphysema: Secondary | ICD-10-CM | POA: Diagnosis not present

## 2024-02-10 LAB — POCT URINALYSIS DIP (CLINITEK)
Bilirubin, UA: NEGATIVE
Blood, UA: NEGATIVE
Glucose, UA: NEGATIVE mg/dL
Ketones, POC UA: NEGATIVE mg/dL
Nitrite, UA: NEGATIVE
POC PROTEIN,UA: NEGATIVE
Spec Grav, UA: 1.025 (ref 1.010–1.025)
Urobilinogen, UA: 0.2 U/dL
pH, UA: 5.5 (ref 5.0–8.0)

## 2024-02-10 LAB — POCT GLYCOSYLATED HEMOGLOBIN (HGB A1C): Hemoglobin A1C: 5.5 % (ref 4.0–5.6)

## 2024-02-10 MED ORDER — ALPRAZOLAM 0.25 MG PO TABS: 0.2500 mg | ORAL_TABLET | Freq: Every evening | ORAL | 0 refills | Status: AC | PRN

## 2024-02-10 MED ORDER — EUTHYROX 200 MCG PO TABS: 200.0000 ug | ORAL_TABLET | Freq: Every day | ORAL | 3 refills | Status: DC

## 2024-02-10 MED ORDER — SIMVASTATIN 40 MG PO TABS: 40.0000 mg | ORAL_TABLET | Freq: Every day | ORAL | 0 refills | Status: DC

## 2024-02-10 MED ORDER — MELOXICAM 15 MG PO TABS: 15.0000 mg | ORAL_TABLET | Freq: Every day | ORAL | 3 refills | Status: AC

## 2024-02-10 MED ORDER — MONTELUKAST SODIUM 10 MG PO TABS: 10.0000 mg | ORAL_TABLET | Freq: Every day | ORAL | 0 refills | Status: DC

## 2024-02-10 NOTE — Progress Notes (Signed)
        Established patient visit  History, exam, impression, and plan:  1. Centrilobular emphysema (HCC) (Primary) Pleasant 83 year old female presenting today with a history of central lobar emphysema.  She is now managed by pulmonology and reports that she is doing well after they increased her Trelegy to a higher dose.  Using albuterol  nebulizers and taking Singulair  as prescribed.  Today, her respirations are even and unlabored.  Lungs are clear to auscultation throughout.  Continue follow-up with pulmonology as directed. - montelukast  (SINGULAIR ) 10 MG tablet; Take 1 tablet (10 mg total) by mouth at bedtime.  Dispense: 90 tablet; Refill: 0  2. Postoperative hypothyroidism Currently taking Euthyrox  200 mg daily, tolerating well.  No side effects.  Her last 2 TSH levels were within normal limits so no need for labs today.  Denies any concerning symptoms at this time.  Refilling Euthyrox  200 mcg daily.  Plan for labs in 6 months. - EUTHYROX  200 MCG tablet; Take 1 tablet (200 mcg total) by mouth daily before breakfast.  Dispense: 90 tablet; Refill: 3  3. Osteoporosis screening She is postmenopausal and due for osteoporosis screening.  Last DEXA scan in 2022 was normal.  Reordering DEXA scan today. - DG Bone Density; Future  4. Pulmonary nodules Now followed by pulmonology as noted above.  5. Anxiety She does have some intermittent issues with severe anxiety but also admits to some depression today.  This is often related to her health and the limitations that come with aging.  Has used Xanax  very sparingly in the past for episodes of severe anxiety and is almost out.  Refilling Xanax  today with caution to use only for severe anxiety that does not respond to other measures.  6. Seasonal allergic rhinitis, unspecified trigger As noted above, she does have emphysema and is managed by pulmonology.  Taking Singulair  10 mg daily which works well to help keep her seasonal allergic rhinitis and  check.  Refilling today. - montelukast  (SINGULAIR ) 10 MG tablet; Take 1 tablet (10 mg total) by mouth at bedtime.  Dispense: 90 tablet; Refill: 0  7. Elevated cholesterol She does have a history of elevated cholesterol and is currently on simvastatin  40 mg daily.  Tolerates the medication well without side effects.  Denies any concerning issues today.  Continue simvastatin  40 mg daily as prescribed. - simvastatin  (ZOCOR ) 40 MG tablet; Take 1 tablet (40 mg total) by mouth at bedtime.  Dispense: 30 tablet; Refill: 0  8.  Prediabetes Last hemoglobin A1c checked 6 months ago was 6.0%.  Recheck today shows improvement of 5.5%.  Continue dietary modifications and increasing physical activity.   Procedures performed this visit: None.  Return in about 6 months (around 08/12/2024) for chronic disease follow up.  __________________________________ Maryl Snook, DNP, APRN, FNP-BC Primary Care and Sports Medicine [facility] [place]

## 2024-02-12 ENCOUNTER — Encounter: Payer: Self-pay | Admitting: Medical-Surgical

## 2024-02-12 LAB — URINE CULTURE

## 2024-02-17 ENCOUNTER — Ambulatory Visit

## 2024-02-17 ENCOUNTER — Ambulatory Visit (INDEPENDENT_AMBULATORY_CARE_PROVIDER_SITE_OTHER): Admitting: Sports Medicine

## 2024-02-17 ENCOUNTER — Telehealth: Payer: Self-pay

## 2024-02-17 ENCOUNTER — Encounter: Payer: Self-pay | Admitting: Sports Medicine

## 2024-02-17 ENCOUNTER — Other Ambulatory Visit (INDEPENDENT_AMBULATORY_CARE_PROVIDER_SITE_OTHER)

## 2024-02-17 DIAGNOSIS — M25512 Pain in left shoulder: Secondary | ICD-10-CM

## 2024-02-17 DIAGNOSIS — M19012 Primary osteoarthritis, left shoulder: Secondary | ICD-10-CM | POA: Diagnosis not present

## 2024-02-17 DIAGNOSIS — E890 Postprocedural hypothyroidism: Secondary | ICD-10-CM

## 2024-02-17 DIAGNOSIS — G8929 Other chronic pain: Secondary | ICD-10-CM

## 2024-02-17 DIAGNOSIS — I700 Atherosclerosis of aorta: Secondary | ICD-10-CM | POA: Diagnosis not present

## 2024-02-17 MED ORDER — TRIAMCINOLONE ACETONIDE 40 MG/ML IJ SUSP
40.0000 mg | Freq: Once | INTRAMUSCULAR | Status: AC
  Administered 2024-02-17: 40 mg via INTRAMUSCULAR

## 2024-02-17 NOTE — Progress Notes (Signed)
    Procedures performed today:    Procedure: Real-time Ultrasound Guided injection of the left glenohumeral joint Device: Samsung HS60  Verbal informed consent obtained.  Time-out conducted.  Noted no overlying erythema, induration, or other signs of local infection.  Skin prepped in a sterile fashion.  Local anesthesia: Topical Ethyl chloride.  With sterile technique and under real time ultrasound guidance: Arthritic changes noted, 1 cc Kenalog 40, 2 cc lidocaine, 2 cc bupivacaine injected easily Completed without difficulty  Advised to call if fevers/chills, erythema, induration, drainage, or persistent bleeding.  Images permanently stored and available for review in PACS.  Impression: Technically successful ultrasound guided injection.  Independent interpretation of notes and tests performed by another provider:   None.  Brief History, Exam, Impression, and Recommendations:    Left shoulder pain This is a very pleasant 83 year old female, multifactorial left shoulder pain, approximately 9 months ago we did subacromial and bicipital injections, she did well for a while, now having recurrence of pain, today she has severe loss of external rotation, otherwise she has fairly good strength in all directions. Abduction and external rotation creates some pain, she has good strength abduction. On review of her x-rays from the last visit it does look like she has some glenohumeral osteoarthritis likely worsened. Today we did a glenohumeral joint injection, I would like updated x-rays, formal PT, return to see me in 6 weeks.    ____________________________________________ Joselyn Nicely. Sandy Crumb, M.D., ABFM., CAQSM., AME. Primary Care and Sports Medicine [facility]  Adjunct Professor of [facility] Medicine  [employer]  Restaurant manager, fast food

## 2024-02-17 NOTE — Assessment & Plan Note (Signed)
 This is a very pleasant 83 year old female, multifactorial left shoulder pain, approximately 9 months ago we did subacromial and bicipital injections, she did well for a while, now having recurrence of pain, today she has severe loss of external rotation, otherwise she has fairly good strength in all directions. Abduction and external rotation creates some pain, she has good strength abduction. On review of her x-rays from the last visit it does look like she has some glenohumeral osteoarthritis likely worsened. Today we did a glenohumeral joint injection, I would like updated x-rays, formal PT, return to see me in 6 weeks.

## 2024-02-17 NOTE — Telephone Encounter (Signed)
 Copied from CRM [id number]. Topic: Clinical - Medication Question >> Feb 14, 2024  3:07 PM Christine Barry wrote: Reason for CRM: Christine Barry from [facility] in [place] said the EUTHYROX  200 MCG tablet is not available in that brand name and wants to know if ok with replacing it. [phone].

## 2024-02-18 MED ORDER — LEVOTHYROXINE SODIUM 200 MCG PO TABS: 200.0000 ug | ORAL_TABLET | Freq: Every day | ORAL | 1 refills | Status: DC

## 2024-02-18 NOTE — Addendum Note (Signed)
 Addended by: Izora Marten on: 02/18/2024 02:08 PM   Modules accepted: Orders

## 2024-02-18 NOTE — Telephone Encounter (Signed)
 Spoke with Christine Barry at [facility] - states the only Brand name available is "synthroid"  and the only other option is the generic levothyroxine.

## 2024-02-18 NOTE — Telephone Encounter (Signed)
 Called and advised pt of recommendations. She voiced understanding and agreed.   TSH  to be checked 6-8 weeks after starting Synthroid.

## 2024-02-19 ENCOUNTER — Ambulatory Visit

## 2024-02-19 ENCOUNTER — Ambulatory Visit: Payer: Self-pay | Admitting: Medical-Surgical

## 2024-02-19 DIAGNOSIS — Z13820 Encounter for screening for osteoporosis: Secondary | ICD-10-CM | POA: Diagnosis not present

## 2024-02-19 DIAGNOSIS — Z780 Asymptomatic menopausal state: Secondary | ICD-10-CM | POA: Diagnosis not present

## 2024-02-20 ENCOUNTER — Ambulatory Visit: Payer: Self-pay | Admitting: Sports Medicine

## 2024-02-25 DIAGNOSIS — J439 Emphysema, unspecified: Secondary | ICD-10-CM | POA: Diagnosis not present

## 2024-02-25 DIAGNOSIS — I700 Atherosclerosis of aorta: Secondary | ICD-10-CM | POA: Diagnosis not present

## 2024-02-25 DIAGNOSIS — R918 Other nonspecific abnormal finding of lung field: Secondary | ICD-10-CM | POA: Diagnosis not present

## 2024-02-25 DIAGNOSIS — I2510 Atherosclerotic heart disease of native coronary artery without angina pectoris: Secondary | ICD-10-CM | POA: Diagnosis not present

## 2024-02-26 NOTE — Therapy (Signed)
 OUTPATIENT PHYSICAL THERAPY SHOULDER EVALUATION   Patient Name: Christine Barry MRN: [id number] DOB:[birth year], 83 y.o., female Today's Date: 02/27/2024  END OF SESSION:  PT End of Session - 02/27/24 1603     Visit Number 1    Number of Visits 16    Date for PT Re-Evaluation 04/23/24    Authorization Type [employer] $15 copay    PT Start Time 1445    PT Stop Time 1535    PT Time Calculation (min) 50 min    Activity Tolerance Patient tolerated treatment well             Past Medical History:  Diagnosis Date   Allergy 1964   Anxiety 1986   Arthritis    Asthma    COPD (chronic obstructive pulmonary disease) (HCC)    Emphysema of lung (HCC) 2016   Hyperlipidemia    Thyroid  disease    Past Surgical History:  Procedure Laterality Date   CESAREAN SECTION     EYE SURGERY  2018   Cataracts   Patient Active Problem List   Diagnosis Date Noted   Pulmonary nodules 09/23/2023   Pedal edema 09/10/2023   Chronic cough 09/10/2023   SOB (shortness of breath) 09/10/2023   Chronic nasal congestion 09/10/2023   Microalbuminuria 09/10/2023   Atelectasis 09/09/2023   Left shoulder pain 06/06/2023   Weight gain 06/08/2022   Grief at loss of child 06/08/2022   Postoperative hypothyroidism 03/08/2021   Moderate persistent asthma without complication 03/08/2021   Osteoporosis screening 03/08/2021   Right hip pain 08/23/2020   Centrilobular emphysema (HCC) 07/23/2019   Anxiety 07/23/2019   Diverticulosis 07/23/2019   Elevated cholesterol 07/23/2019   History of nicotine dependence 07/23/2019   Hyperplastic colon polyp 07/23/2019   Increased glucose level 07/23/2019   Osteoarthritis 07/23/2019   Redundant colon 07/23/2019   Seasonal allergic rhinitis 07/23/2019   Sensorineural hearing loss (SNHL) of both ears 07/23/2019   Vitamin D deficiency 07/23/2019   Xerosis of skin 07/23/2019   Pseudoptosis 09/16/2013   Dermatochalasis 09/16/2013    PCP: Cherre Cornish,  NP  REFERRING PROVIDER: Dr Gean Keels  REFERRING DIAG: Chronic L shoulder pain   THERAPY DIAG:  Other symptoms and signs involving the musculoskeletal system  Acute pain of left shoulder  Muscle weakness (generalized)  Rationale for Evaluation and Treatment: Rehabilitation  ONSET DATE: 01/07/24  SUBJECTIVE:                                                                                                                                                                                      SUBJECTIVE STATEMENT: Patient reports that she has pain in  the L shoulder over the winter during COPD flare up. She did a lot of sitting with the COPD and her shoulder started hurting more. She had injection in the L shoulder 02/17/24. She notes improvement in shoulder pain but has continued muscle pain in the shoulder and arm.   Hand dominance: Right  PERTINENT HISTORY: Bursitis L shoulder over the years; R hip pain; arthritis: COPD; thyroid  issues; cholesterol   PAIN:  Are you having pain? Yes: NPRS scale: 0/10 at rest; lying on L side 3-4/10;  Pain location: L shoulder  Pain description: sharp  Aggravating factors: lying on L side; reaching behind; movement   Relieving factors: injection  PRECAUTIONS: None  RED FLAGS: None   WEIGHT BEARING RESTRICTIONS: No  FALLS:  Has patient fallen in last 6 months? No  LIVING ENVIRONMENT: Lives with: lives alone Lives in: House/apartment Stairs: No Has following equipment at home: Grab bars  OCCUPATION: Retired - Engineer, site; Electronics engineer - retired at [employer] chores; yard work; socialize; some walking; yoga   PLOF: Independent  PATIENT GOALS:decrease pain and improve movement and use of L shoulder and arm  NEXT MD VISIT: 03/30/24  OBJECTIVE:  Note: Objective measures were completed at Evaluation unless otherwise noted.  DIAGNOSTIC FINDINGS:  Xray L shoulder 02/20/24: Mild glenohumeral and acromioclavicular  osteoarthritis.   PATIENT SURVEYS:  UEFS  Extreme difficulty/unable (0), Quite a bit of difficulty (1), Moderate difficulty (2), Little difficulty (3), No difficulty (4) Survey date:    Any of your usual work, household or school activities 3  2. Your usual hobbies, recreational/sport activities 3   3. Lifting a bag of groceries to waist level 2   4. Lifting a bag of groceries above your head 0  5. Grooming your hair 3  6. Pushing up on your hands (I.e. from bathtub or chair) 4  7. Preparing food (I.e. peeling/cutting) 2  8. Driving  4  9. Vacuuming, sweeping, or raking 2  10. Dressing  2  11. Doing up buttons 4  12. Using tools/appliances 4  13. Opening doors 3  14. Cleaning  3  15. Tying or lacing shoes 4  16. Sleeping  2  17. Laundering clothes (I.e. washing, ironing, folding) 2  18. Opening a jar 2  19. Throwing a ball 3  20. Carrying a small suitcase with your affected limb.  2  Score total:  54     COGNITION: Overall cognitive status: Within functional limits for tasks assessed     SENSATION: WFL  POSTURE: Head forward; shoulders rounded and elevated; head of the humerus anterior in orientation; scapulae abducted and rotated along the thoracic spine   UPPER EXTREMITY ROM:   Active ROM Right eval Left eval  Shoulder flexion 150 129 pain  Shoulder extension 62 47 pain   Shoulder abduction 170 130 painful arc  Shoulder adduction    Shoulder internal rotation T8/9 T10/11  Shoulder external rotation 584 31  Elbow flexion    Elbow extension    Wrist flexion    Wrist extension    Wrist ulnar deviation    Wrist radial deviation    Wrist pronation    Wrist supination    (Blank rows = not tested)  UPPER EXTREMITY MMT:  MMT Right eval Left eval  Shoulder flexion 4+ 4-  Shoulder extension 5 5-  Shoulder abduction 4+ 4- pain   Shoulder adduction    Shoulder internal rotation 4+ 4-  Shoulder external rotation 4+ 4-  Middle trapezius weak weak  Lower  trapezius weak weak  Elbow flexion    Elbow extension    Wrist flexion    Wrist extension    Wrist ulnar deviation    Wrist radial deviation    Wrist pronation    Wrist supination    Grip strength (lbs)    (Blank rows = not tested)  PALPATION:  Muscular tightness L shoulder girdle through pecs; cervical musculature and biceps tendon area to insertion of deltoid                                                                                                                              TREATMENT DATE: 02/27/24   PATIENT EDUCATION: Education details: POC ; HEP Person educated: Patient Education method: Programmer, multimedia, Facilities manager, Actor cues, Verbal cues, and Handouts Education comprehension: verbalized understanding, returned demonstration, verbal cues required, tactile cues required, and needs further education  HOME EXERCISE PROGRAM: Access Code: [email or web address] URL: [email or web address] Date: 02/27/2024 Prepared by: Vonnie Ligman  Exercises - Seated Cervical Retraction  - 2 x daily - 7 x weekly - 1-2 sets - 5-10 reps - 10 sec  hold - Seated Scapular Retraction  - 2 x daily - 7 x weekly - 1-2 sets - 10 reps - 10 sec  hold - Shoulder External Rotation and Scapular Retraction  - 1 x daily - 7 x weekly - 1 sets - 10 reps - 3-5 sec   hold - Seated Shoulder W  - 1 x daily - 7 x weekly - 1 sets - 10 reps - 3 sec  hold - Standing Posterior Deltoid Release with Ball at Wall  - 2 x daily - 7 x weekly  ASSESSMENT:  CLINICAL IMPRESSION: Patient is a 83 y.o. female who was seen today for physical therapy evaluation and treatment for chronic L shoulder pain. She reports flare up after forced decrease in activity related to increased symptoms of COPD for several months over the winter. Patient was sitting in her recliner hours per day, often playing chess on her phone. She received an injection in L shoulder 02/17/24 with good improvement. She reports continued muscle pain in the area  of deltoid insertion with functional activities. Evaluation reveals that patient has poor posture and alignment; limited shoulders ROM L shoulder; decreased UE strength; muscular tightness to palpation L shoulder girdle, especially at insertion of the deltoid. Patient will benefit from PT to address problems identified.     OBJECTIVE IMPAIRMENTS: decreased activity tolerance, decreased ROM, decreased strength, impaired UE functional use, improper body mechanics, postural dysfunction, and pain.   ACTIVITY LIMITATIONS: carrying, lifting, dressing, and reach over head  PARTICIPATION LIMITATIONS: meal prep, cleaning, and laundry  PERSONAL FACTORS: Behavior pattern, Past/current experiences, and Time since onset of injury/illness/exacerbation are also affecting patient's functional outcome.   REHAB POTENTIAL: Good  CLINICAL DECISION MAKING: Evolving/moderate complexity  EVALUATION COMPLEXITY: Moderate   GOALS: Goals reviewed with patient? Yes  SHORT TERM  GOALS: Target date: 03/26/2024  Independent in initial HEP  Baseline: Goal status: INITIAL  2.  Decrease pain L shoulder by 50-70% allowing patient to lift L UE with minimal to no pain  Baseline:  Goal status: INITIAL  3.  Patient to demonstrate and/or verbalize modifications for use of phone during on line chess games  Baseline:  Goal status: INITIAL  LONG TERM GOALS: Target date: 04/23/2024   Decrease pain in L shoulder by 75-100% allowing patient to return to all normal functional activities with pain no greater that 0-2/10 Baseline:  Goal status: INITIAL  2.  Increase AROM L shoulder by 5-10 degrees in elevation and ER  Baseline:  Goal status: INITIAL  3.  Increased posterior shoulder functional strength in middle and lower traps to facilitate improved upright posture to improve control for active movement L UE Baseline:  Goal status: INITIAL  4.  Independent in HEP Baseline:  Goal status: INITIAL  5.  Patient  demonstrates and/or verbalizes good ergonomic changes to improve posture and alignment  Baseline:  Goal status: INITIAL  6.  Increase UEFS score by 10 points  Baseline:  Goal status: INITIAL  PLAN:  PT FREQUENCY: 2x/week  PT DURATION: 8 weeks  PLANNED INTERVENTIONS: 97164- PT Re-evaluation, 97110-Therapeutic exercises, 97530- Therapeutic activity, 97112- Neuromuscular re-education, 97535- Self Care, 09811- Manual therapy, Patient/Family education, Taping, Dry Needling, Joint mobilization, Cryotherapy, and Moist heat  PLAN FOR NEXT SESSION: review and progress exercises; postural education and correction; manual work and modalities as indicated    Daksha Koone P Covey Baller, PT 02/27/2024, 4:04 PM

## 2024-02-27 ENCOUNTER — Ambulatory Visit: Attending: Sports Medicine | Admitting: Rehabilitative and Restorative Service Providers"

## 2024-02-27 ENCOUNTER — Other Ambulatory Visit: Payer: Self-pay

## 2024-02-27 ENCOUNTER — Encounter: Payer: Self-pay | Admitting: Rehabilitative and Restorative Service Providers"

## 2024-02-27 DIAGNOSIS — M6281 Muscle weakness (generalized): Secondary | ICD-10-CM | POA: Insufficient documentation

## 2024-02-27 DIAGNOSIS — G8929 Other chronic pain: Secondary | ICD-10-CM | POA: Diagnosis not present

## 2024-02-27 DIAGNOSIS — R29898 Other symptoms and signs involving the musculoskeletal system: Secondary | ICD-10-CM | POA: Diagnosis present

## 2024-02-27 DIAGNOSIS — M5412 Radiculopathy, cervical region: Secondary | ICD-10-CM | POA: Insufficient documentation

## 2024-02-27 DIAGNOSIS — M25512 Pain in left shoulder: Secondary | ICD-10-CM | POA: Insufficient documentation

## 2024-03-05 ENCOUNTER — Ambulatory Visit: Payer: Self-pay | Admitting: Rehabilitative and Restorative Service Providers"

## 2024-03-05 ENCOUNTER — Encounter: Payer: Self-pay | Admitting: Rehabilitative and Restorative Service Providers"

## 2024-03-05 DIAGNOSIS — R29898 Other symptoms and signs involving the musculoskeletal system: Secondary | ICD-10-CM | POA: Diagnosis not present

## 2024-03-05 DIAGNOSIS — M5412 Radiculopathy, cervical region: Secondary | ICD-10-CM

## 2024-03-05 DIAGNOSIS — M25512 Pain in left shoulder: Secondary | ICD-10-CM

## 2024-03-05 DIAGNOSIS — M6281 Muscle weakness (generalized): Secondary | ICD-10-CM

## 2024-03-05 NOTE — Therapy (Signed)
 OUTPATIENT PHYSICAL THERAPY SHOULDER EVALUATION   Patient Name: Christine Barry MRN: [id number] DOB:11-Nov-1940, 83 y.o., female Today's Date: 03/05/2024  END OF SESSION:  PT End of Session - 03/05/24 1400     Visit Number 2    Number of Visits 16    Date for PT Re-Evaluation 04/23/24    Authorization Type [employer] $15 copay    PT Start Time 1400    PT Stop Time 1445    PT Time Calculation (min) 45 min    Activity Tolerance Patient tolerated treatment well             Past Medical History:  Diagnosis Date   Allergy 1964   Anxiety 1986   Arthritis    Asthma    COPD (chronic obstructive pulmonary disease) (HCC)    Emphysema of lung (HCC) 2016   Hyperlipidemia    Thyroid  disease    Past Surgical History:  Procedure Laterality Date   CESAREAN SECTION     EYE SURGERY  2018   Cataracts   Patient Active Problem List   Diagnosis Date Noted   Pulmonary nodules 09/23/2023   Pedal edema 09/10/2023   Chronic cough 09/10/2023   SOB (shortness of breath) 09/10/2023   Chronic nasal congestion 09/10/2023   Microalbuminuria 09/10/2023   Atelectasis 09/09/2023   Left shoulder pain 06/06/2023   Weight gain 06/08/2022   Grief at loss of child 06/08/2022   Postoperative hypothyroidism 03/08/2021   Moderate persistent asthma without complication 03/08/2021   Osteoporosis screening 03/08/2021   Right hip pain 08/23/2020   Centrilobular emphysema (HCC) 07/23/2019   Anxiety 07/23/2019   Diverticulosis 07/23/2019   Elevated cholesterol 07/23/2019   History of nicotine dependence 07/23/2019   Hyperplastic colon polyp 07/23/2019   Increased glucose level 07/23/2019   Osteoarthritis 07/23/2019   Redundant colon 07/23/2019   Seasonal allergic rhinitis 07/23/2019   Sensorineural hearing loss (SNHL) of both ears 07/23/2019   Vitamin D deficiency 07/23/2019   Xerosis of skin 07/23/2019   Pseudoptosis 09/16/2013   Dermatochalasis 09/16/2013    PCP: Cherre Cornish,  NP  REFERRING PROVIDER: Dr Gean Keels  REFERRING DIAG: Chronic L shoulder pain   THERAPY DIAG:  Other symptoms and signs involving the musculoskeletal system  Acute pain of left shoulder  Muscle weakness (generalized)  Cervical radiculopathy  Rationale for Evaluation and Treatment: Rehabilitation  ONSET DATE: 01/07/24  SUBJECTIVE:                                                                                                                                                                                      SUBJECTIVE STATEMENT: Some soreness from stretching  and exercises. She has made modifications in the way she holds phone while playing chess. Switched to ipad instead of phone for chess. Feels this will help. More comfortable with exercise after treatment today.   EVAL: Patient reports that she has pain in the L shoulder over the winter during COPD flare up. She did a lot of sitting with the COPD and her shoulder started hurting more. She had injection in the L shoulder 02/17/24. She notes improvement in shoulder pain but has continued muscle pain in the shoulder and arm.   Hand dominance: Right  PERTINENT HISTORY: Bursitis L shoulder over the years; R hip pain; arthritis: COPD; thyroid  issues; cholesterol   PAIN:  Are you having pain? Yes: NPRS scale: 0/10 at rest; lying on L side just soreness no pain   Pain location: L shoulder  Pain description: sharp  Aggravating factors: lying on L side; reaching behind; movement   Relieving factors: injection  PRECAUTIONS: None   WEIGHT BEARING RESTRICTIONS: No  FALLS:  Has patient fallen in last 6 months? 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  DIAGNOSTIC FINDINGS:  Xray L shoulder 02/20/24: Mild glenohumeral and acromioclavicular osteoarthritis.   PATIENT SURVEYS:  UEFS  Extreme difficulty/unable (0), Quite a bit of difficulty (1), Moderate difficulty (2), Little difficulty (3), No difficulty (4) Survey date:    Any of your usual work, household or school activities 3  2. Your usual hobbies, recreational/sport activities 3   3. Lifting a bag of groceries to waist level 2   4. Lifting a bag of groceries above your head 0  5. Grooming your hair 3  6. Pushing up on your hands (I.e. from bathtub or chair) 4  7. Preparing food (I.e. peeling/cutting) 2  8. Driving  4  9. Vacuuming, sweeping, or raking 2  10. Dressing  2  11. Doing up buttons 4  12. Using tools/appliances 4  13. Opening doors 3  14. Cleaning  3  15. Tying or lacing shoes 4  16. Sleeping  2  17. Laundering clothes (I.e. washing, ironing, folding) 2  18. Opening a jar 2  19. Throwing a ball 3  20. Carrying a small suitcase with your affected limb.  2  Score total:  54     POSTURE: Head forward; shoulders rounded and elevated; head of the humerus anterior in orientation; scapulae abducted and rotated along the thoracic spine   UPPER EXTREMITY ROM:   Active ROM Right eval Left eval  Shoulder flexion 150 129 pain  Shoulder extension 62 47 pain   Shoulder abduction 170 130 painful arc  Shoulder adduction    Shoulder internal rotation T8/9 T10/11  Shoulder external rotation 58 31  Elbow flexion    Elbow extension    Wrist flexion    Wrist extension    Wrist ulnar deviation    Wrist radial deviation    Wrist pronation    Wrist supination    (Blank rows = not tested)  UPPER EXTREMITY MMT:  MMT Right eval Left eval  Shoulder flexion 4+ 4-  Shoulder extension 5 5-  Shoulder abduction 4+  4- pain   Shoulder adduction    Shoulder internal rotation 4+ 4-  Shoulder external rotation 4+ 4-  Middle trapezius weak weak  Lower trapezius weak weak  Elbow flexion    Elbow extension    Wrist flexion    Wrist extension    Wrist ulnar deviation    Wrist radial deviation    Wrist pronation    Wrist supination    Grip strength (lbs)    (Blank rows = not tested)  PALPATION:  Muscular tightness L shoulder girdle through pecs; cervical musculature and biceps tendon area to insertion of deltoid    [facility] Adult PT Treatment:                                                DATE: 03/05/24 Therapeutic Exercise: Sitting  Chin tuck 5 sec x 5  Scap squeeze 5 sec x 10  Standing Supine  Manual Therapy:  Neuromuscular re-ed: Postural correction and education Therapeutic Activity: Sitting  Chin tuck with PT assist for technique  Scap squeeze with verbal and tactile cues for technique  Standing  Chin tuck 5 sec x 5 Scap squeeze 5 sec x 5  Scap squeeze with ER 5 sec x 10  W 5 sec x 10  Supine  Chin tuck on two pillows 5 sec x 10 Chest lift 5 sec x 10  Diaphragmatic breathing count of 4-6 ~ 5 min  Self Care: Education re posture and alignment for functional activities - use of phone and ipad.                                                                                                                              TREATMENT DATE: 02/27/24   PATIENT EDUCATION: Education details: POC ; HEP Person educated: Patient Education method: Programmer, multimedia, Facilities manager, Actor cues, Verbal cues, and Handouts Education comprehension: verbalized understanding, returned demonstration, verbal cues required, tactile cues required, and needs further education  HOME EXERCISE PROGRAM: Access Code: [email or web address] URL: [email or web address] Date: 03/05/2024 Prepared by: Wendall Isabell  Exercises - Seated Cervical Retraction  - 2 x daily - 7 x weekly - 1-2 sets - 5-10 reps - 10 sec  hold -  Seated Scapular Retraction  - 2 x daily - 7 x weekly - 1-2 sets - 10 reps - 10 sec  hold - Shoulder External Rotation and Scapular Retraction  - 1 x daily - 7 x weekly - 1 sets - 10 reps - 3-5 sec   hold - Seated Shoulder W  - 1 x daily - 7 x weekly - 1 sets - 10 reps - 3 sec  hold - Standing Posterior Deltoid Release with Ball at Wall  - 2 x daily - 7 x weekly - Supine Diaphragmatic Breathing  - 2 x daily -  7 x weekly - 1 sets - 10 reps - 4-6 sec  hold - Supine Cervical Retraction with Towel  - 2 x daily - 7 x weekly - 1 sets - 5-10 reps - 10 sec  hold - Supine Scapular Retraction  - 2 x daily - 7 x weekly - 1 sets - 10 reps - 5-10 sec  hold - Isometric Shoulder Abduction at Wall  - 2 x daily - 7 x weekly - 1 sets - 5-10 reps - 5 sec  hold  ASSESSMENT:  CLINICAL IMPRESSION: Patient returns reporting decreased pain in the L shoulder but she has some tightness and soreness in the neck muscles from the exercises. Reviewed and corrected exercises. Took pictures of proper position for exercises on patient's phone so she can refer to the pictures as needed at home. Added diaphragmatic breathing to address increased accessory muscle breathing pattern that increases overwork of cervical and upper chest muscles.    Eval: Patient is a 83 y.o. female who was seen today for physical therapy evaluation and treatment for chronic L shoulder pain. She reports flare up after forced decrease in activity related to increased symptoms of COPD for several months over the winter. Patient was sitting in her recliner hours per day, often playing chess on her phone. She received an injection in L shoulder 02/17/24 with good improvement. She reports continued muscle pain in the area of deltoid insertion with functional activities. Evaluation reveals that patient has poor posture and alignment; limited shoulders ROM L shoulder; decreased UE strength; muscular tightness to palpation L shoulder girdle, especially at insertion of  the deltoid. Patient will benefit from PT to address problems identified.     OBJECTIVE IMPAIRMENTS: decreased activity tolerance, decreased ROM, decreased strength, impaired UE functional use, improper body mechanics, postural dysfunction, and pain.    GOALS: Goals reviewed with patient? Yes  SHORT TERM GOALS: Target date: 03/26/2024  Independent in initial HEP  Baseline: Goal status: INITIAL  2.  Decrease pain L shoulder by 50-70% allowing patient to lift L UE with minimal to no pain  Baseline:  Goal status: INITIAL  3.  Patient to demonstrate and/or verbalize modifications for use of phone during on line chess games  Baseline:  Goal status: INITIAL  LONG TERM GOALS: Target date: 04/23/2024   Decrease pain in L shoulder by 75-100% allowing patient to return to all normal functional activities with pain no greater that 0-2/10 Baseline:  Goal status: INITIAL  2.  Increase AROM L shoulder by 5-10 degrees in elevation and ER  Baseline:  Goal status: INITIAL  3.  Increased posterior shoulder functional strength in middle and lower traps to facilitate improved upright posture to improve control for active movement L UE Baseline:  Goal status: INITIAL  4.  Independent in HEP Baseline:  Goal status: INITIAL  5.  Patient demonstrates and/or verbalizes good ergonomic changes to improve posture and alignment  Baseline:  Goal status: INITIAL  6.  Increase UEFS score by 10 points  Baseline:  Goal status: INITIAL  PLAN:  PT FREQUENCY: 2x/week  PT DURATION: 8 weeks  PLANNED INTERVENTIONS: 97164- PT Re-evaluation, 97110-Therapeutic exercises, 97530- Therapeutic activity, 97112- Neuromuscular re-education, 97535- Self Care, 16109- Manual therapy, Patient/Family education, Taping, Dry Needling, Joint mobilization, Cryotherapy, and Moist heat  PLAN FOR NEXT SESSION: review and progress exercises; postural education and correction; manual work and modalities as indicated     Siobhan Zaro P Twan Harkin, PT 03/05/2024, 2:01 PM

## 2024-03-11 ENCOUNTER — Encounter: Payer: Self-pay | Admitting: Rehabilitative and Restorative Service Providers"

## 2024-03-11 ENCOUNTER — Ambulatory Visit: Payer: Self-pay | Attending: Sports Medicine | Admitting: Rehabilitative and Restorative Service Providers"

## 2024-03-11 DIAGNOSIS — M25551 Pain in right hip: Secondary | ICD-10-CM

## 2024-03-11 DIAGNOSIS — M5412 Radiculopathy, cervical region: Secondary | ICD-10-CM

## 2024-03-11 DIAGNOSIS — M25512 Pain in left shoulder: Secondary | ICD-10-CM

## 2024-03-11 DIAGNOSIS — R29898 Other symptoms and signs involving the musculoskeletal system: Secondary | ICD-10-CM

## 2024-03-11 DIAGNOSIS — M6281 Muscle weakness (generalized): Secondary | ICD-10-CM | POA: Diagnosis present

## 2024-03-11 NOTE — Therapy (Addendum)
 OUTPATIENT PHYSICAL THERAPY SHOULDER EVALUATION PHYSICAL THERAPY DISCHARGE SUMMARY  Visits from Start of Care: 3  Current functional level related to goals / functional outcomes: See progress note for discharge status    Remaining deficits: Unknown    Education / Equipment: HEP   Patient agrees to discharge. Patient goals were partially met. Patient is being discharged due to not returning since the last visit. Christine Barry PT, MPH 06/17/24 8:23 AM     Patient Name: Christine Barry MRN: [id number] DOB:[birth year], 83 y.o., female Today's Date: 03/11/2024  END OF SESSION:  PT End of Session - 03/11/24 1023     Visit Number 3    Number of Visits 16    Date for PT Re-Evaluation 04/23/24    Authorization Type [employer] $15 copay    PT Start Time 1018    PT Stop Time 1100    PT Time Calculation (min) 42 min    Activity Tolerance Patient tolerated treatment well             Past Medical History:  Diagnosis Date   Allergy 1964   Anxiety 1986   Arthritis    Asthma    COPD (chronic obstructive pulmonary disease) (HCC)    Emphysema of lung (HCC) 2016   Hyperlipidemia    Thyroid  disease    Past Surgical History:  Procedure Laterality Date   CESAREAN SECTION     EYE SURGERY  2018   Cataracts   Patient Active Problem List   Diagnosis Date Noted   Pulmonary nodules 09/23/2023   Pedal edema 09/10/2023   Chronic cough 09/10/2023   SOB (shortness of breath) 09/10/2023   Chronic nasal congestion 09/10/2023   Microalbuminuria 09/10/2023   Atelectasis 09/09/2023   Left shoulder pain 06/06/2023   Weight gain 06/08/2022   Grief at loss of child 06/08/2022   Postoperative hypothyroidism 03/08/2021   Moderate persistent asthma without complication 03/08/2021   Osteoporosis screening 03/08/2021   Right hip pain 08/23/2020   Centrilobular emphysema (HCC) 07/23/2019   Anxiety 07/23/2019   Diverticulosis 07/23/2019   Elevated cholesterol 07/23/2019    History of nicotine dependence 07/23/2019   Hyperplastic colon polyp 07/23/2019   Increased glucose level 07/23/2019   Osteoarthritis 07/23/2019   Redundant colon 07/23/2019   Seasonal allergic rhinitis 07/23/2019   Sensorineural hearing loss (SNHL) of both ears 07/23/2019   Vitamin D deficiency 07/23/2019   Xerosis of skin 07/23/2019   Pseudoptosis 09/16/2013   Dermatochalasis 09/16/2013    PCP: Zada Palin, NP  REFERRING PROVIDER: Dr Debby JINNY Petties  REFERRING DIAG: Chronic L shoulder pain   THERAPY DIAG:  Other symptoms and signs involving the musculoskeletal system  Acute pain of left shoulder  Muscle weakness (generalized)  Cervical radiculopathy  Right hip pain  Rationale for Evaluation and Treatment: Rehabilitation  ONSET DATE: 01/07/24  SUBJECTIVE:  SUBJECTIVE STATEMENT: Shoulder feels good. Found a lap desk/pillow to place phone or tablet to use for chess games. Exercises are going well. No pain or problems.   EVAL: Patient reports that she has pain in the L shoulder over the winter during COPD flare up. She did a lot of sitting with the COPD and her shoulder started hurting more. She had injection in the L shoulder 02/17/24. She notes improvement in shoulder pain but has continued muscle pain in the shoulder and arm.   Hand dominance: Right  PERTINENT HISTORY: Bursitis L shoulder over the years; R hip pain; arthritis: COPD; thyroid  issues; cholesterol   PAIN:  Are you having pain? Yes: NPRS scale: 0/10 at rest; lying on L side just soreness no pain   Pain location: L shoulder  Pain description: sharp  Aggravating factors: lying on L side; reaching behind; movement   Relieving factors: injection  PRECAUTIONS: None   WEIGHT BEARING RESTRICTIONS: No  FALLS:  Has patient  fallen in last 6 months? No  LIVING ENVIRONMENT: Lives with: lives alone Lives in: House/apartment Stairs: No Has following equipment at home: Grab bars  OCCUPATION: Retired - Engineer, site; Electronics engineer - retired at [employer] chores; yard work; socialize; some walking; yoga   PATIENT GOALS:decrease pain and improve movement and use of L shoulder and arm  NEXT MD VISIT: 03/30/24  OBJECTIVE:  Note: Objective measures were completed at Evaluation unless otherwise noted.  DIAGNOSTIC FINDINGS:  Xray L shoulder 02/20/24: Mild glenohumeral and acromioclavicular osteoarthritis.   PATIENT SURVEYS:  UEFS  Extreme difficulty/unable (0), Quite a bit of difficulty (1), Moderate difficulty (2), Little difficulty (3), No difficulty (4) Survey date:    Any of your usual work, household or school activities 3  2. Your usual hobbies, recreational/sport activities 3   3. Lifting a bag of groceries to waist level 2   4. Lifting a bag of groceries above your head 0  5. Grooming your hair 3  6. Pushing up on your hands (I.e. from bathtub or chair) 4  7. Preparing food (I.e. peeling/cutting) 2  8. Driving  4  9. Vacuuming, sweeping, or raking 2  10. Dressing  2  11. Doing up buttons 4  12. Using tools/appliances 4  13. Opening doors 3  14. Cleaning  3  15. Tying or lacing shoes 4  16. Sleeping  2  17. Laundering clothes (I.e. washing, ironing, folding) 2  18. Opening a jar 2  19. Throwing a ball 3  20. Carrying a small suitcase with your affected limb.  2  Score total:  54     POSTURE: Head forward; shoulders rounded and elevated; head of the humerus anterior in orientation; scapulae abducted and rotated along the thoracic spine   UPPER EXTREMITY ROM:   Active ROM Right eval Left eval  Shoulder flexion 150 129 pain  Shoulder extension 62 47 pain   Shoulder abduction 170 130 painful arc  Shoulder adduction    Shoulder internal rotation T8/9 T10/11  Shoulder external  rotation 58 31  Elbow flexion    Elbow extension    Wrist flexion    Wrist extension    Wrist ulnar deviation    Wrist radial deviation    Wrist pronation    Wrist supination    (Blank rows = not tested)  UPPER EXTREMITY MMT:  MMT Right eval Left eval  Shoulder flexion 4+ 4-  Shoulder extension 5 5-  Shoulder abduction 4+ 4- pain  Shoulder adduction    Shoulder internal rotation 4+ 4-  Shoulder external rotation 4+ 4-  Middle trapezius weak weak  Lower trapezius weak weak  Elbow flexion    Elbow extension    Wrist flexion    Wrist extension    Wrist ulnar deviation    Wrist radial deviation    Wrist pronation    Wrist supination    Grip strength (lbs)    (Blank rows = not tested)  PALPATION:  Muscular tightness L shoulder girdle through pecs; cervical musculature and biceps tendon area to insertion of deltoid    [facility] Adult PT Treatment:                                                DATE: 03/11/24 Therapeutic Exercise: Sitting  Chin tuck 5 sec x 5  Scap squeeze 5 sec x 10  Standing Supine  Manual Therapy:  Neuromuscular re-ed: Postural correction and education Therapeutic Activity: Sitting  Chin tuck with PT assist for technique  Scap squeeze with verbal and tactile cues for technique  Axial extension yellow TB 3 sec x 5 x 2  Standing  Chin tuck 5 sec x 5 Scap squeeze 5 sec x 5  Scap squeeze with ER 5 sec x 10  W 5 sec x 10  Row isometric step back 5 sec x 10 red TB IR isometric step out red TB 5 sec x 10  ER isometric step out yellow TB 5 sec x 10  Supine  Diaphragmatic breathing count of 4-6(reviewed for home) Self Care: Education re posture and alignment for functional activities - use of phone and ipad.    [facility] Adult PT Treatment:                                                DATE: 03/05/24 Therapeutic Exercise: Sitting  Chin tuck 5 sec x 5  Scap squeeze 5 sec x 10  Standing Supine  Manual Therapy:  Neuromuscular re-ed: Postural  correction and education Therapeutic Activity: Sitting  Chin tuck with PT assist for technique  Scap squeeze with verbal and tactile cues for technique  Standing  Chin tuck 5 sec x 5 Scap squeeze 5 sec x 5  Scap squeeze with ER 5 sec x 10  W 5 sec x 10  Supine  Chin tuck on two pillows 5 sec x 10 Chest lift 5 sec x 10  Diaphragmatic breathing count of 4-6 ~ 5 min  Self Care: Education re posture and alignment for functional activities - use of phone and ipad.  TREATMENT DATE: 02/27/24   PATIENT EDUCATION: Education details: POC ; HEP Person educated: Patient Education method: Programmer, multimedia, Facilities manager, Actor cues, Verbal cues, and Handouts Education comprehension: verbalized understanding, returned demonstration, verbal cues required, tactile cues required, and needs further education  HOME EXERCISE PROGRAM: Access Code: [email or web address] URL: [email or web address] Date: 03/11/2024 Prepared by: Fronnie Urton  Exercises - Seated Cervical Retraction  - 2 x daily - 7 x weekly - 1-2 sets - 5-10 reps - 10 sec  hold - Seated Scapular Retraction  - 2 x daily - 7 x weekly - 1-2 sets - 10 reps - 10 sec  hold - Shoulder External Rotation and Scapular Retraction  - 1 x daily - 7 x weekly - 1 sets - 10 reps - 3-5 sec   hold - Seated Shoulder W  - 1 x daily - 7 x weekly - 1 sets - 10 reps - 3 sec  hold - Standing Posterior Deltoid Release with Ball at Wall  - 2 x daily - 7 x weekly - Supine Diaphragmatic Breathing  - 2 x daily - 7 x weekly - 1 sets - 10 reps - 4-6 sec  hold - Supine Cervical Retraction with Towel  - 2 x daily - 7 x weekly - 1 sets - 5-10 reps - 10 sec  hold - Supine Scapular Retraction  - 2 x daily - 7 x weekly - 1 sets - 10 reps - 5-10 sec  hold - Isometric Shoulder Abduction at Wall  - 2 x daily - 7 x weekly - 1 sets - 5-10 reps - 5 sec   hold - Standing Shoulder Row Reactive Isometric  - 2 x daily - 7 x weekly - 1 sets - 10 reps - 30-45 sec  hold - Shoulder Internal Rotation Reactive Isometrics  - 2 x daily - 7 x weekly - 1 sets - 10 reps - 3-5 sec  hold - External Rotation Reactive Isometrics with Flex Bar  - 2 x daily - 7 x weekly - 1-2 sets - 10 reps - 3-5 sec  hold - Cervical Retraction with Resistance  - 2 x daily - 7 x weekly - 1 sets - 10 reps - 3 sec  hold  ASSESSMENT:  CLINICAL IMPRESSION: Patient reports and demonstrates excellent progress with resolution of pain in the L shoulder as well as the tightness and soreness in the cervical spine. Added isometric exercises with TB again taking pictures of proper position for exercises on patient's phone so she can refer to the pictures as needed at home. Will progress strengthening as indicated next visit. Sharman has changed her sitting posture and is now propping the phone or ipad when she is playing chess on line.    Eval: Patient is a 83 y.o. female who was seen today for physical therapy evaluation and treatment for chronic L shoulder pain. She reports flare up after forced decrease in activity related to increased symptoms of COPD for several months over the winter. Patient was sitting in her recliner hours per day, often playing chess on her phone. She received an injection in L shoulder 02/17/24 with good improvement. She reports continued muscle pain in the area of deltoid insertion with functional activities. Evaluation reveals that patient has poor posture and alignment; limited shoulders ROM L shoulder; decreased UE strength; muscular tightness to palpation L shoulder girdle, especially at insertion of the deltoid. Patient will benefit from PT to address problems identified.     OBJECTIVE IMPAIRMENTS: decreased activity  tolerance, decreased ROM, decreased strength, impaired UE functional use, improper body mechanics, postural dysfunction, and pain.    GOALS: Goals  reviewed with patient? Yes  SHORT TERM GOALS: Target date: 03/26/2024  Independent in initial HEP  Baseline: Goal status: INITIAL  2.  Decrease pain L shoulder by 50-70% allowing patient to lift L UE with minimal to no pain  Baseline:  Goal status: INITIAL  3.  Patient to demonstrate and/or verbalize modifications for use of phone during on line chess games  Baseline:  Goal status: INITIAL  LONG TERM GOALS: Target date: 04/23/2024   Decrease pain in L shoulder by 75-100% allowing patient to return to all normal functional activities with pain no greater that 0-2/10 Baseline:  Goal status: INITIAL  2.  Increase AROM L shoulder by 5-10 degrees in elevation and ER  Baseline:  Goal status: INITIAL  3.  Increased posterior shoulder functional strength in middle and lower traps to facilitate improved upright posture to improve control for active movement L UE Baseline:  Goal status: INITIAL  4.  Independent in HEP Baseline:  Goal status: INITIAL  5.  Patient demonstrates and/or verbalizes good ergonomic changes to improve posture and alignment  Baseline:  Goal status: INITIAL  6.  Increase UEFS score by 10 points  Baseline:  Goal status: INITIAL  PLAN:  PT FREQUENCY: 2x/week  PT DURATION: 8 weeks  PLANNED INTERVENTIONS: 97164- PT Re-evaluation, 97110-Therapeutic exercises, 97530- Therapeutic activity, 97112- Neuromuscular re-education, 97535- Self Care, 02859- Manual therapy, Patient/Family education, Taping, Dry Needling, Joint mobilization, Cryotherapy, and Moist heat  PLAN FOR NEXT SESSION: review and progress exercises; postural education and correction; manual work and modalities as indicated    [employer], PT 03/11/2024, 10:23 AM

## 2024-03-12 ENCOUNTER — Other Ambulatory Visit: Payer: Self-pay | Admitting: Medical-Surgical

## 2024-03-12 DIAGNOSIS — E7800 Pure hypercholesterolemia, unspecified: Secondary | ICD-10-CM

## 2024-03-18 ENCOUNTER — Encounter: Payer: Self-pay | Admitting: Rehabilitative and Restorative Service Providers"

## 2024-03-20 DIAGNOSIS — J441 Chronic obstructive pulmonary disease with (acute) exacerbation: Secondary | ICD-10-CM | POA: Diagnosis not present

## 2024-03-20 DIAGNOSIS — R911 Solitary pulmonary nodule: Secondary | ICD-10-CM | POA: Diagnosis not present

## 2024-03-25 ENCOUNTER — Encounter: Payer: Self-pay | Admitting: Rehabilitative and Restorative Service Providers"

## 2024-03-30 ENCOUNTER — Ambulatory Visit (INDEPENDENT_AMBULATORY_CARE_PROVIDER_SITE_OTHER): Admitting: Sports Medicine

## 2024-03-30 VITALS — Wt 170.0 lb

## 2024-03-30 DIAGNOSIS — G8929 Other chronic pain: Secondary | ICD-10-CM

## 2024-03-30 DIAGNOSIS — M25512 Pain in left shoulder: Secondary | ICD-10-CM | POA: Diagnosis not present

## 2024-03-30 NOTE — Assessment & Plan Note (Signed)
 This is a very pleasant 83 year old female, multifactorial chronic left shoulder pain, she had subacromial and bicipital injections about 10 months ago. She had a recurrence of pain, severe loss of external rotation suspicious for glenohumeral arthritis and some degree of adhesive capsulitis, I injected her glenohumeral joint at the last visit she returns today for the most part pain-free, only tiny amount of discomfort posterior joint line but doing well. If this recurs we can do another steroid injection, if it has not been at least 3 months we can also consider PRP. Otherwise return to see me as needed and continue the exercises given to her by physical therapy indefinitely.

## 2024-03-30 NOTE — Progress Notes (Signed)
    Procedures performed today:    None.  Independent interpretation of notes and tests performed by another provider:   None.  Brief History, Exam, Impression, and Recommendations:    Left shoulder pain This is a very pleasant 83 year old female, multifactorial chronic left shoulder pain, she had subacromial and bicipital injections about 10 months ago. She had a recurrence of pain, severe loss of external rotation suspicious for glenohumeral arthritis and some degree of adhesive capsulitis, I injected her glenohumeral joint at the last visit she returns today for the most part pain-free, only tiny amount of discomfort posterior joint line but doing well. If this recurs we can do another steroid injection, if it has not been at least 3 months we can also consider PRP. Otherwise return to see me as needed and continue the exercises given to her by physical therapy indefinitely.    ____________________________________________ Debby PARAS. Curtis, M.D., ABFM., CAQSM., AME. Primary Care and Sports Medicine [facility]  Adjunct Professor of [facility] Medicine  [employer]  Restaurant manager, fast food

## 2024-04-03 DIAGNOSIS — R918 Other nonspecific abnormal finding of lung field: Secondary | ICD-10-CM | POA: Diagnosis not present

## 2024-04-03 DIAGNOSIS — J9611 Chronic respiratory failure with hypoxia: Secondary | ICD-10-CM | POA: Diagnosis not present

## 2024-04-03 DIAGNOSIS — J432 Centrilobular emphysema: Secondary | ICD-10-CM | POA: Diagnosis not present

## 2024-04-08 ENCOUNTER — Telehealth (HOSPITAL_COMMUNITY): Payer: Self-pay

## 2024-04-08 ENCOUNTER — Encounter (HOSPITAL_COMMUNITY): Payer: Self-pay

## 2024-04-08 NOTE — Telephone Encounter (Signed)
Attempted to call patient in regards to Pulmonary Rehab - LM on VM   Sent letter 

## 2024-04-08 NOTE — Telephone Encounter (Signed)
 Outside/paper referral received by Dr. Kendrick Fries from [facility]. Will fax over Physician order and request further documents. Insurance benefits and eligibility to be determined.

## 2024-04-13 ENCOUNTER — Telehealth (HOSPITAL_COMMUNITY): Payer: Self-pay

## 2024-04-13 NOTE — Telephone Encounter (Addendum)
 Received referral from Dr. Vicenta Lennert for this pt to participate in Pulmonary Rehab with the diagnosis of chronic respiratory failure with hypoxia and centrilobular emphysema. Clinical review of pt follow up appt on 04/03/24 Pulmonary office note. Pt appropriate for scheduling for Pulmonary rehab. Will forward to support staff for scheduling and verification of insurance eligibility/benefits with pt consent.   Ronal Levin, RN, BSN Cardiac and Pulmonary Rehab

## 2024-04-14 ENCOUNTER — Telehealth (HOSPITAL_COMMUNITY): Payer: Self-pay

## 2024-04-14 DIAGNOSIS — J300 Vasomotor rhinitis: Secondary | ICD-10-CM | POA: Insufficient documentation

## 2024-04-14 NOTE — Telephone Encounter (Signed)
 Pt insurance is active and benefits verified through [facility]. Co-pay $15, DED $0/$0 met, out of pocket $3,400/$407.70 met, co-insurance 0%. No pre-authorization required. 04/14/2024 @ 9:43am, spoke with [employer], REF# [id number].

## 2024-04-14 NOTE — Telephone Encounter (Signed)
 Called patient to see if Christine Barry was interested in participating in the Pulmonary Rehab Program. Patient stated yes. Patient will come in for orientation on 7/9 at 9 am and will attend the 1:15 exercise class.  Sent package via Mychart

## 2024-04-15 ENCOUNTER — Telehealth (HOSPITAL_COMMUNITY): Payer: Self-pay

## 2024-04-15 ENCOUNTER — Encounter (HOSPITAL_COMMUNITY)
Admission: RE | Admit: 2024-04-15 | Discharge: 2024-04-15 | Disposition: A | Discharge status: Home / Self Care | Source: Ambulatory Visit | Attending: Pulmonary Disease | Admitting: Pulmonary Disease

## 2024-04-15 ENCOUNTER — Encounter (HOSPITAL_COMMUNITY): Payer: Self-pay

## 2024-04-15 VITALS — BP 124/68 | HR 89 | Wt 175.5 lb

## 2024-04-15 DIAGNOSIS — J432 Centrilobular emphysema: Secondary | ICD-10-CM | POA: Insufficient documentation

## 2024-04-15 DIAGNOSIS — J9611 Chronic respiratory failure with hypoxia: Secondary | ICD-10-CM | POA: Insufficient documentation

## 2024-04-15 NOTE — Progress Notes (Signed)
 Christine Barry 83 y.o. female  Pulmonary Rehab Orientation Note  This patient who was referred to Pulmonary Rehab by Dr. Vicenta Lennert with the diagnosis of chronic respiratory failure with hypoxia and centrilobular emphysema arrived today in Cardiac and Pulmonary Rehab. She  arrived ambulatory with normal gait. She  does not carry portable oxygen . Per patient, Christine Barry uses oxygen  never. Color good, skin warm and dry. Patient is oriented to time and place. Patient's medical history, psychosocial health, and medications reviewed.   Psychosocial assessment reveals patient lives with alone. Christine Barry is currently retired. Patient hobbies include watching tv, spending time with others, and facebook. Patient reports her stress level is low. Areas of stress/anxiety include health and family . Patient does not exhibit signs of depression. Signs of depression include hopelessness and fatigue. PHQ2/9 score 1/1. Christine Barry shows good  coping skills with positive outlook on life. Offered emotional support and reassurance. Will continue to monitor and evaluate progress toward psychosocial goal(s) of decreased health related and family stressors.   Physical assessment reveals: Well appearing, A&Ox4, NAD Eyes/Ears: + glasses Lungs: Clear with no wheezes, rales, rhonchi, + chronic productive cough, + dyspnea on exertion Heart: Regular rate rhythm, no murmurs, no rubs, no clicks Gastrointestinal: abdomin soft, + bowel sounds in all 4 quads, denies recent weight gain or loss, endorses normal BMs Genitourinary: WNL, pt denies s/s Extremities:  +2 pulses, grip strength equal, strong, no edema, no cyanosis, no clubbing Integumentary: pt denies any rashes, open or non healing wounds Psy/Soc: Pt endorses anxiety, takes xanax  prn. Pt is worried and concerned about her 2 grandchildren that have been through alot in the past years. Losing their father, their only uncle to suicide and moving from [place] to [place] during COVID. Pt  states she has to live long and be there for their grandchildren and her daughter  Assistive devices: none  Christine Barry reports she does take medications as prescribed. Patient states she follows a regular  diet. The patient has been trying to lose weight through a healthy diet and exercise program.. Pt's weight will be monitored closely.   Demonstration and practice of PLB using pulse oximeter. Christine Barry able to return demonstration satisfactorily. Safety and hand hygiene in the exercise area reviewed with patient. Man voices understanding of the information reviewed. Department expectations discussed with patient and achievable goals were set. The patient shows enthusiasm about attending the program and we look forward to working with Christine. Christine Barry completed a 6 min walk test today and is scheduled to begin exercise on 04/21/24 at 1015.   0850-1030 Christine Levin, RN, BSN

## 2024-04-15 NOTE — Progress Notes (Signed)
 Pulmonary Individual Treatment Plan  Patient Details  Name: Christine Barry MRN: [id number] Date of Birth: Jun 25, 1941 Referring Provider:   [name] Pulmonary Rehab Walk Test from 04/15/2024 in [facility]  Referring Provider McQuaid    Initial Encounter Date:  Flowsheet Row Pulmonary Rehab Walk Test from 04/15/2024 in [facility]  Date 04/15/24    Visit Diagnosis: Chronic respiratory failure with hypoxia (HCC)  Centrilobular emphysema (HCC)  Patient's Home Medications on Admission:   Current Outpatient Medications:    albuterol  (PROVENTIL ) (2.5 MG/3ML) 0.083% nebulizer solution, Take 3 mLs (2.5 mg total) by nebulization every 6 (six) hours as needed for wheezing or shortness of breath., Disp: 150 mL, Rfl: 1   albuterol  (VENTOLIN  HFA) 108 (90 Base) MCG/ACT inhaler, Inhale 2 puffs into the lungs every 6 (six) hours as needed for wheezing or shortness of breath., Disp: 8 g, Rfl: 1   ALPRAZolam  (XANAX ) 0.25 MG tablet, Take 1 tablet (0.25 mg total) by mouth at bedtime as needed for anxiety., Disp: 15 tablet, Rfl: 0   azithromycin  (ZITHROMAX ) 250 MG tablet, Take 250 mg by mouth daily., Disp: , Rfl:    cetirizine (ZYRTEC) 10 MG tablet, Take 10 mg by mouth daily as needed., Disp: , Rfl:    Cholecalciferol 50 MCG (2000 UT) TABS, Take 2,000 Units by mouth daily., Disp: , Rfl:    cyanocobalamin  1000 MCG tablet, Take 1,000 mcg by mouth 3 (three) times a week., Disp: , Rfl:    Ferrous Sulfate (IRON PO), Take by mouth. Nature made, Disp: , Rfl:    finasteride (PROSCAR) 5 MG tablet, Take 5 mg by mouth daily., Disp: , Rfl:    furosemide  (LASIX ) 20 MG tablet, Take 20 mg by mouth., Disp: , Rfl:    ipratropium (ATROVENT ) 0.03 % nasal spray, Place 1 spray into the nose 4 (four) times daily as needed., Disp: , Rfl:    levothyroxine  (SYNTHROID ) 200 MCG tablet, Take 1 tablet (200 mcg  total) by mouth daily before breakfast. BRAND NAME ONLY, Disp: 90 tablet, Rfl: 1   meloxicam  (MOBIC ) 15 MG tablet, Take 1 tablet (15 mg total) by mouth daily., Disp: 90 tablet, Rfl: 3   minoxidil (LONITEN) 2.5 MG tablet, Take 1.25 mg by mouth daily., Disp: , Rfl:    montelukast  (SINGULAIR ) 10 MG tablet, Take 1 tablet (10 mg total) by mouth at bedtime., Disp: 90 tablet, Rfl: 0   predniSONE  (DELTASONE ) 5 MG tablet, Take 5 mg by mouth., Disp: , Rfl:    simvastatin  (ZOCOR ) 40 MG tablet, TAKE 1 TABLET BY MOUTH AT BEDTIME, Disp: 30 tablet, Rfl: 0   TRELEGY ELLIPTA  200-62.5-25 MCG/ACT AEPB, Inhale 1 puff into the lungs., Disp: , Rfl:    cyclobenzaprine  (FLEXERIL ) 10 MG tablet, Take 0.5-1 tablets (5-10 mg total) by mouth 3 (three) times daily as needed for muscle spasms. Caution: can cause drowsiness, Disp: 60 tablet, Rfl: 1  Past Medical History: Past Medical History:  Diagnosis Date   Allergy 1964   Anxiety 1986   Arthritis    Asthma    COPD (chronic obstructive pulmonary disease) (HCC)    Emphysema of lung (HCC) 2016   Hyperlipidemia    Thyroid  disease     Tobacco Use: Social History   Tobacco Use  Smoking Status Former   Current packs/day: 0.00   Average packs/day: 2.0 packs/day for 38.7 years (77.5 ttl pk-yrs)   Types:  Cigarettes   Start date: 10/08/1961   Quit date: 07/07/2000   Years since quitting: 23.7  Smokeless Tobacco Never    Labs: Review Flowsheet  More data may exist      Latest Ref Rng & Units 06/08/2021 07/20/2022 02/07/2023 07/16/2023 02/10/2024  Labs for ITP Cardiac and Pulmonary Rehab  Cholestrol <200 mg/dL 793  766  807  - -  LDL (calc) mg/dL (calc) 893  876  93  - -  HDL-C > OR = 50 mg/dL 72  82  70  - -  Trlycerides <150 mg/dL 831  839  797  - -  Hemoglobin A1c 4.0 - 5.6 % - - 6.0  6.0  5.5     Capillary Blood Glucose: No results found for: GLUCAP   Pulmonary Assessment Scores:  Pulmonary Assessment Scores     Row Name 04/15/24 0952         ADL UCSD    ADL Phase Entry     SOB Score total 34       CAT Score   CAT Score 15       mMRC Score   mMRC Score 3       UCSD: Self-administered rating of dyspnea associated with activities of daily living (ADLs) 6-point scale (0 = not at all to 5 = maximal or unable to do because of breathlessness)  Scoring Scores range from 0 to 120.  Minimally important difference is 5 units  CAT: CAT can identify the health impairment of COPD patients and is better correlated with disease progression.  CAT has a scoring range of zero to 40. The CAT score is classified into four groups of low (less than 10), medium (10 - 20), high (21-30) and very high (31-40) based on the impact level of disease on health status. A CAT score over 10 suggests significant symptoms.  A worsening CAT score could be explained by an exacerbation, poor medication adherence, poor inhaler technique, or progression of COPD or comorbid conditions.  CAT MCID is 2 points  mMRC: mMRC (Modified Medical Research Council) Dyspnea Scale is used to assess the degree of baseline functional disability in patients of respiratory disease due to dyspnea. No minimal important difference is established. A decrease in score of 1 point or greater is considered a positive change.   Pulmonary Function Assessment:  Pulmonary Function Assessment - 04/15/24 0955       Breath   Bilateral Breath Sounds Clear    Shortness of Breath Yes;Limiting activity          Exercise Target Goals: Exercise Program Goal: Individual exercise prescription set using results from initial 6 min walk test and THRR while considering  patient's activity barriers and safety.   Exercise Prescription Goal: Initial exercise prescription builds to 30-45 minutes a day of aerobic activity, 2-3 days per week.  Home exercise guidelines will be given to patient during program as part of exercise prescription that the participant will acknowledge.  Activity Barriers & Risk  Stratification:  Activity Barriers & Cardiac Risk Stratification - 04/15/24 1104       Activity Barriers & Cardiac Risk Stratification   Activity Barriers Arthritis;Deconditioning;Muscular Weakness;Shortness of Breath          6 Minute Walk:  6 Minute Walk     Row Name 04/15/24 1056         6 Minute Walk   Phase Initial     Distance 985 feet     Walk Time 6 minutes     #  of Rest Breaks 1  2:40-2:50     MPH 1.87     METS 1.93     RPE 11     Perceived Dyspnea  1     VO2 Peak 6.75     Symptoms No     Resting HR 89 bpm     Resting BP 124/58     Resting Oxygen  Saturation  92 %     Exercise Oxygen  Saturation  during 6 min walk 87 %     Max Ex. HR 105 bpm     Max Ex. BP 160/80     2 Minute Post BP 120/80       Interval HR   1 Minute HR 90     2 Minute HR 101     3 Minute HR 90     4 Minute HR 93     5 Minute HR 100     6 Minute HR 105     2 Minute Post HR 86     Interval Heart Rate? Yes       Interval Oxygen    Interval Oxygen ? Yes     Baseline Oxygen  Saturation % 92 %     1 Minute Oxygen  Saturation % 93 %     1 Minute Liters of Oxygen  0 L     2 Minute Oxygen  Saturation % 87 %     2 Minute Liters of Oxygen  0 L  increased to 1L     3 Minute Oxygen  Saturation % 89 %  87% at 2:40     3 Minute Liters of Oxygen  1 L  increased to 2L     4 Minute Oxygen  Saturation % 94 %     4 Minute Liters of Oxygen  2 L     5 Minute Oxygen  Saturation % 93 %     5 Minute Liters of Oxygen  2 L     6 Minute Oxygen  Saturation % 90 %     6 Minute Liters of Oxygen  2 L     2 Minute Post Oxygen  Saturation % 95 %     2 Minute Post Liters of Oxygen  2 L        Oxygen  Initial Assessment:  Oxygen  Initial Assessment - 04/15/24 0954       Home Oxygen    Home Oxygen  Device None    Sleep Oxygen  Prescription None    Home Exercise Oxygen  Prescription None    Home Resting Oxygen  Prescription None      Initial 6 min Walk   Oxygen  Used Continuous    Liters per minute 2      Program Oxygen   Prescription   Program Oxygen  Prescription Continuous    Liters per minute 2      Intervention   Short Term Goals To learn and exhibit compliance with exercise, home and travel O2 prescription;To learn and understand importance of maintaining oxygen  saturations>88%;To learn and demonstrate proper use of respiratory medications;To learn and understand importance of monitoring SPO2 with pulse oximeter and demonstrate accurate use of the pulse oximeter.;To learn and demonstrate proper pursed lip breathing techniques or other breathing techniques.     Long  Term Goals Exhibits compliance with exercise, home  and travel O2 prescription;Verbalizes importance of monitoring SPO2 with pulse oximeter and return demonstration;Maintenance of O2 saturations>88%;Exhibits proper breathing techniques, such as pursed lip breathing or other method taught during program session;Compliance with respiratory medication;Demonstrates proper use of MDI's  Oxygen  Re-Evaluation:  Oxygen  Re-Evaluation     Row Name 04/15/24 0954             Goals/Expected Outcomes   Goals/Expected Outcomes Compliance and understanding of oxygen  saturation monitoring and breathing techniques to decrease shortness of breath.          Oxygen  Discharge (Final Oxygen  Re-Evaluation):  Oxygen  Re-Evaluation - 04/15/24 0954       Goals/Expected Outcomes   Goals/Expected Outcomes Compliance and understanding of oxygen  saturation monitoring and breathing techniques to decrease shortness of breath.          Initial Exercise Prescription:  Initial Exercise Prescription - 04/15/24 1000       Date of Initial Exercise RX and Referring Provider   Date 04/15/24    Referring Provider McQuaid    Expected Discharge Date 07/09/24      Oxygen    Oxygen  Continuous    Liters 2    Maintain Oxygen  Saturation 88% or higher      Treadmill   MPH 1.5    Grade 0    Minutes 15    METs 2      NuStep   Level 1    SPM 60     Minutes 15    METs 1.5      Prescription Details   Frequency (times per week) 2    Duration Progress to 30 minutes of continuous aerobic without signs/symptoms of physical distress      Intensity   THRR 40-80% of Max Heartrate 56-11    Ratings of Perceived Exertion 11-13    Perceived Dyspnea 0-4      Progression   Progression Continue to progress workloads to maintain intensity without signs/symptoms of physical distress.      Resistance Training   Training Prescription Yes    Weight red bands    Reps 10-15          Perform Capillary Blood Glucose checks as needed.  Exercise Prescription Changes:   Exercise Comments:   Exercise Goals and Review:   Exercise Goals     Row Name 04/15/24 1105             Exercise Goals   Increase Physical Activity Yes       Intervention Provide advice, education, support and counseling about physical activity/exercise needs.;Develop an individualized exercise prescription for aerobic and resistive training based on initial evaluation findings, risk stratification, comorbidities and participant's personal goals.       Expected Outcomes Short Term: Attend rehab on a regular basis to increase amount of physical activity.;Long Term: Add in home exercise to make exercise part of routine and to increase amount of physical activity.;Long Term: Exercising regularly at least 3-5 days a week.       Increase Strength and Stamina Yes       Intervention Provide advice, education, support and counseling about physical activity/exercise needs.;Develop an individualized exercise prescription for aerobic and resistive training based on initial evaluation findings, risk stratification, comorbidities and participant's personal goals.       Expected Outcomes Short Term: Increase workloads from initial exercise prescription for resistance, speed, and METs.;Short Term: Perform resistance training exercises routinely during rehab and add in resistance training  at home;Long Term: Improve cardiorespiratory fitness, muscular endurance and strength as measured by increased METs and functional capacity ( )       Able to understand and use rate of perceived exertion (RPE) scale Yes       Intervention Provide education and  explanation on how to use RPE scale       Expected Outcomes Short Term: Able to use RPE daily in rehab to express subjective intensity level;Long Term:  Able to use RPE to guide intensity level when exercising independently       Able to understand and use Dyspnea scale Yes       Intervention Provide education and explanation on how to use Dyspnea scale       Expected Outcomes Short Term: Able to use Dyspnea scale daily in rehab to express subjective sense of shortness of breath during exertion;Long Term: Able to use Dyspnea scale to guide intensity level when exercising independently       Knowledge and understanding of Target Heart Rate Range (THRR) Yes       Intervention Provide education and explanation of THRR including how the numbers were predicted and where they are located for reference       Expected Outcomes Short Term: Able to state/look up THRR;Long Term: Able to use THRR to govern intensity when exercising independently;Short Term: Able to use daily as guideline for intensity in rehab       Understanding of Exercise Prescription Yes       Intervention Provide education, explanation, and written materials on patient's individual exercise prescription       Expected Outcomes Short Term: Able to explain program exercise prescription;Long Term: Able to explain home exercise prescription to exercise independently          Exercise Goals Re-Evaluation :  Exercise Goals Re-Evaluation     Row Name 04/15/24 1105             Exercise Goal Re-Evaluation   Exercise Goals Review Increase Physical Activity;Able to understand and use Dyspnea scale;Understanding of Exercise Prescription;Increase Strength and Stamina;Knowledge and  understanding of Target Heart Rate Range (THRR);Able to understand and use rate of perceived exertion (RPE) scale       Comments Sharman is scheduled to begin exercise on 7/15. Will continue to monitor and progress as able.       Expected Outcomes i. Through exercise at rehab and home, the patient will decrease shortness of breath with daily activities and feel confident in carrying out an exercise regimen at home.          Discharge Exercise Prescription (Final Exercise Prescription Changes):   Nutrition:  Target Goals: Understanding of nutrition guidelines, daily intake of sodium 1500mg , cholesterol 200mg , calories 30% from fat and 7% or less from saturated fats, daily to have 5 or more servings of fruits and vegetables.  Biometrics:  Pre Biometrics - 04/15/24 0919       Pre Biometrics   Grip Strength 10 kg           Nutrition Therapy Plan and Nutrition Goals:   Nutrition Assessments:  MEDIFICTS Score Key: >=70 Need to make dietary changes  40-70 Heart Healthy Diet <= 40 Therapeutic Level Cholesterol Diet   Picture Your Plate Scores: <59 Unhealthy dietary pattern with much room for improvement. 41-50 Dietary pattern unlikely to meet recommendations for good health and room for improvement. 51-60 More healthful dietary pattern, with some room for improvement.  >60 Healthy dietary pattern, although there may be some specific behaviors that could be improved.    Nutrition Goals Re-Evaluation:   Nutrition Goals Discharge (Final Nutrition Goals Re-Evaluation):   Psychosocial: Target Goals: Acknowledge presence or absence of significant depression and/or stress, maximize coping skills, provide positive support system. Participant is able to verbalize  types and ability to use techniques and skills needed for reducing stress and depression.  Initial Review & Psychosocial Screening:  Initial Psych Review & Screening - 04/15/24 0933       Initial Review   Current  issues with Current Psychotropic Meds;Current Stress Concerns    Source of Stress Concerns Chronic Illness    Comments Pt is worried and concerned about her 2 grandchildren that have been through alot in the past years. Losing their father and their only uncle and moving from [place] to [place]. Pt states she has to live long and be there for them and her daughter      Family Dynamics   Good Support System? Yes      Barriers   Psychosocial barriers to participate in program Psychosocial barriers identified (see note)      Screening Interventions   Interventions Encouraged to exercise;To provide support and resources with identified psychosocial needs;Provide feedback about the scores to participant    Expected Outcomes Short Term goal: Utilizing psychosocial counselor, staff and physician to assist with identification of specific Stressors or current issues interfering with healing process. Setting desired goal for each stressor or current issue identified.;Long Term Goal: Stressors or current issues are controlled or eliminated.;Short Term goal: Identification and review with participant of any Quality of Life or Depression concerns found by scoring the questionnaire.;Long Term goal: The participant improves quality of Life and PHQ9 Scores as seen by post scores and/or verbalization of changes          Quality of Life Scores:  Scores of 19 and below usually indicate a poorer quality of life in these areas.  A difference of  2-3 points is a clinically meaningful difference.  A difference of 2-3 points in the total score of the Quality of Life Index has been associated with significant improvement in overall quality of life, self-image, physical symptoms, and general health in studies assessing change in quality of life.  PHQ-9: Review Flowsheet  More data exists      04/15/2024 05/14/2023 04/09/2023 02/07/2023 08/09/2022  Depression screen PHQ 2/9  Decreased Interest 0 0 0 0 0  Down, Depressed, Hopeless 1  0 1 0 0  PHQ - 2 Score 1 0 1 0 0  Altered sleeping 0 - - - -  Tired, decreased energy 0 - - - -  Change in appetite 0 - - - -  Feeling bad or failure about yourself  0 - - - -  Trouble concentrating 1 - - - -  Moving slowly or fidgety/restless 0 - - - -  Suicidal thoughts 0 - - - -  PHQ-9 Score 2 - - - -  Difficult doing work/chores Not difficult at all - - - -   Interpretation of Total Score  Total Score Depression Severity:  1-4 = Minimal depression, 5-9 = Mild depression, 10-14 = Moderate depression, 15-19 = Moderately severe depression, 20-27 = Severe depression   Psychosocial Evaluation and Intervention:  Psychosocial Evaluation - 04/15/24 0937       Psychosocial Evaluation & Interventions   Interventions Relaxation education;Encouraged to exercise with the program and follow exercise prescription    Comments Pt is worried and concerned about her 2 grandchildren that have been through alot in the past years. Losing their father and their only uncle and [place] Pt states she has to live long and be there for them and her daughter    Expected Outcomes For Sharman to participate  in [place] without any psy/soc barriers or concerns    Continue Psychosocial Services  Follow up required by staff          Psychosocial Re-Evaluation:   Psychosocial Discharge (Final Psychosocial Re-Evaluation):   Education: Education Goals: Education classes will be provided on a weekly basis, covering required topics. Participant will state understanding/return demonstration of topics presented.  Learning Barriers/Preferences:  Learning Barriers/Preferences - 04/15/24 9061       Learning Barriers/Preferences   Learning Barriers Sight    Learning Preferences None          Education Topics: Know Your Numbers Group instruction that is supported by a PowerPoint presentation. Instructor discusses importance of knowing and understanding resting, exercise, and post-exercise oxygen   saturation, heart rate, and blood pressure. Oxygen  saturation, heart rate, blood pressure, rating of perceived exertion, and dyspnea are reviewed along with a normal range for these values.    Exercise for the Pulmonary Patient Group instruction that is supported by a PowerPoint presentation. Instructor discusses benefits of exercise, core components of exercise, frequency, duration, and intensity of an exercise routine, importance of utilizing pulse oximetry during exercise, safety while exercising, and options of places to exercise outside of rehab.    MET Level  Group instruction provided by PowerPoint, verbal discussion, and written material to support subject matter. Instructor reviews what METs are and how to increase METs.    Pulmonary Medications Verbally interactive group education provided by instructor with focus on inhaled medications and proper administration.   Anatomy and Physiology of the Respiratory System Group instruction provided by PowerPoint, verbal discussion, and written material to support subject matter. Instructor reviews respiratory cycle and anatomical components of the respiratory system and their functions. Instructor also reviews differences in obstructive and restrictive respiratory diseases with examples of each.    Oxygen  Safety Group instruction provided by PowerPoint, verbal discussion, and written material to support subject matter. There is an overview of "What is Oxygen " and "Why do we need it".  Instructor also reviews how to create a safe environment for oxygen  use, the importance of using oxygen  as prescribed, and the risks of noncompliance. There is a brief discussion on traveling with oxygen  and resources the patient may utilize.   Oxygen  Use Group instruction provided by PowerPoint, verbal discussion, and written material to discuss how supplemental oxygen  is prescribed and different types of oxygen  supply systems. Resources for more information  are provided.    Breathing Techniques Group instruction that is supported by demonstration and informational handouts. Instructor discusses the benefits of pursed lip and diaphragmatic breathing and detailed demonstration on how to perform both.     Risk Factor Reduction Group instruction that is supported by a PowerPoint presentation. Instructor discusses the definition of a risk factor, different risk factors for pulmonary disease, and how the heart and lungs work together.   Pulmonary Diseases Group instruction provided by PowerPoint, verbal discussion, and written material to support subject matter. Instructor gives an overview of the different type of pulmonary diseases. There is also a discussion on risk factors and symptoms as well as ways to manage the diseases.   Stress and Energy Conservation Group instruction provided by PowerPoint, verbal discussion, and written material to support subject matter. Instructor gives an overview of stress and the impact it can have on the body. Instructor also reviews ways to reduce stress. There is also a discussion on energy conservation and ways to conserve energy throughout the day.   Warning Signs and Symptoms Group  instruction provided by PowerPoint, verbal discussion, and written material to support subject matter. Instructor reviews warning signs and symptoms of stroke, heart attack, cold and flu. Instructor also reviews ways to prevent the spread of infection.   Other Education Group or individual verbal, written, or video instructions that support the educational goals of the pulmonary rehab program.    Knowledge Questionnaire Score:  Knowledge Questionnaire Score - 04/15/24 1113       Knowledge Questionnaire Score   Pre Score 16/18          Core Components/Risk Factors/Patient Goals at Admission:  Personal Goals and Risk Factors at Admission - 04/15/24 0938       Core Components/Risk Factors/Patient Goals on Admission     Weight Management Yes;Weight Loss    Intervention Weight Management/Obesity: Establish reasonable short term and long term weight goals.;Weight Management: Provide education and appropriate resources to help participant work on and attain dietary goals.;Obesity: Provide education and appropriate resources to help participant work on and attain dietary goals.;Weight Management: Develop a combined nutrition and exercise program designed to reach desired caloric intake, while maintaining appropriate intake of nutrient and fiber, sodium and fats, and appropriate energy expenditure required for the weight goal.    Admit Weight 175 lb 7.8 oz (79.6 kg)    Expected Outcomes Short Term: Continue to assess and modify interventions until short term weight is achieved;Long Term: Adherence to nutrition and physical activity/exercise program aimed toward attainment of established weight goal;Weight Loss: Understanding of general recommendations for a balanced deficit meal plan, which promotes 1-2 lb weight loss per week and includes a negative energy balance of [phone] kcal/d;Understanding recommendations for meals to include 15-35% energy as protein, 25-35% energy from fat, 35-60% energy from carbohydrates, less than 200mg  of dietary cholesterol, 20-35 gm of total fiber daily;Understanding of distribution of calorie intake throughout the day with the consumption of 4-5 meals/snacks    Improve shortness of breath with ADL's Yes    Intervention Provide education, individualized exercise plan and daily activity instruction to help decrease symptoms of SOB with activities of daily living.    Expected Outcomes Short Term: Improve cardiorespiratory fitness to achieve a reduction of symptoms when performing ADLs;Long Term: Be able to perform more ADLs without symptoms or delay the onset of symptoms          Core Components/Risk Factors/Patient Goals Review:    Core Components/Risk Factors/Patient Goals at Discharge  (Final Review):    ITP Comments:   Comments: Dr. Slater Staff is Medical Director for Pulmonary Rehab at [facility].

## 2024-04-15 NOTE — Telephone Encounter (Signed)
 Called McQuaid's office to notify him of Janyce using oxygen  during 6 MWT. Left call back number.

## 2024-04-15 NOTE — Progress Notes (Signed)
   04/15/24 1056  6 Minute Walk  Phase Initial  Distance 985 feet  Walk Time 6 minutes  # of Rest Breaks 1 (2:40-2:50)  MPH 1.87  METS 1.93  RPE 11  Perceived Dyspnea  1  VO2 Peak 6.75  Symptoms No  Resting HR 89 bpm  Resting BP 124/58  Resting Oxygen  Saturation  92 %  Exercise Oxygen  Saturation  during 6 min walk 87 %  Max Ex. HR 105 bpm  Max Ex. BP 160/80  2 Minute Post BP 120/80  Interval HR  1 Minute HR 90  2 Minute HR 101  3 Minute HR 90  4 Minute HR 93  5 Minute HR 100  6 Minute HR 105  2 Minute Post HR 86  Interval Heart Rate? Yes  Interval Oxygen   Interval Oxygen ? Yes  Baseline Oxygen  Saturation % 92 %  1 Minute Oxygen  Saturation % 93 %  1 Minute Liters of Oxygen  0 L  2 Minute Oxygen  Saturation % 87 %  2 Minute Liters of Oxygen  0 L (increased to 1L)  3 Minute Oxygen  Saturation % 89 % (87% at 2:40)  3 Minute Liters of Oxygen  1 L (increased to 2L)  4 Minute Oxygen  Saturation % 94 %  4 Minute Liters of Oxygen  2 L  5 Minute Oxygen  Saturation % 93 %  5 Minute Liters of Oxygen  2 L  6 Minute Oxygen  Saturation % 90 %  6 Minute Liters of Oxygen  2 L  2 Minute Post Oxygen  Saturation % 95 %  2 Minute Post Liters of Oxygen  2 L

## 2024-04-20 DIAGNOSIS — J9611 Chronic respiratory failure with hypoxia: Secondary | ICD-10-CM | POA: Diagnosis not present

## 2024-04-20 DIAGNOSIS — J449 Chronic obstructive pulmonary disease, unspecified: Secondary | ICD-10-CM | POA: Diagnosis not present

## 2024-04-21 ENCOUNTER — Ambulatory Visit (INDEPENDENT_AMBULATORY_CARE_PROVIDER_SITE_OTHER)

## 2024-04-21 ENCOUNTER — Encounter (HOSPITAL_COMMUNITY)
Admission: RE | Admit: 2024-04-21 | Discharge: 2024-04-21 | Disposition: A | Discharge status: Home / Self Care | Source: Ambulatory Visit | Attending: Pulmonary Disease | Admitting: Pulmonary Disease

## 2024-04-21 VITALS — BP 146/69 | HR 103 | Resp 93 | Ht 65.0 in | Wt 173.0 lb

## 2024-04-21 DIAGNOSIS — Z0000 Encounter for general adult medical examination without abnormal findings: Secondary | ICD-10-CM | POA: Diagnosis not present

## 2024-04-21 DIAGNOSIS — J9611 Chronic respiratory failure with hypoxia: Secondary | ICD-10-CM | POA: Diagnosis not present

## 2024-04-21 DIAGNOSIS — J432 Centrilobular emphysema: Secondary | ICD-10-CM

## 2024-04-21 NOTE — Patient Instructions (Signed)
  Christine Barry , Thank you for taking time to come for your Medicare Wellness Visit. I appreciate your ongoing commitment to your health goals. Please review the following plan we discussed and let me know if I can assist you in the future.   These are the goals we discussed:  Goals       Patient Stated      Live and be mobile until the day I die      Patient Stated (pt-stated)      Patient stated that she would like to maintain her staying active and being mobile.      Patient Stated      Patient states she would like to stay healthy and active.         This is a list of the screening recommended for you and due dates:  Health Maintenance  Topic Date Due   COVID-19 Vaccine (8 - Pfizer risk 2024-25 season) 04/08/2024   Flu Shot  05/08/2024   Medicare Annual Wellness Visit  04/21/2025   Mammogram  09/25/2025   DTaP/Tdap/Td vaccine (2 - Td or Tdap) 03/01/2028   DEXA scan (bone density measurement)  02/18/2034   Pneumococcal Vaccine for age over 1  Completed   Zoster (Shingles) Vaccine  Completed   Hepatitis B Vaccine  Aged Out   HPV Vaccine  Aged Out   Meningitis B Vaccine  Aged Out

## 2024-04-21 NOTE — Progress Notes (Signed)
 Subjective:   Christine Barry is a 83 y.o. female who presents for Medicare Annual (Subsequent) preventive examination.  Visit Complete: In person  Patient Medicare AWV questionnaire was completed by the patient on N/a; I have confirmed that all information answered by patient is correct and no changes since this date.  Cardiac Risk Factors include: advanced age (>56men, >10 women);smoking/ tobacco exposure;dyslipidemia     Objective:    Today's Vitals   04/21/24 1349  BP: (!) 146/69  Pulse: (!) 103  Resp: (!) 93  Weight: 173 lb (78.5 kg)  Height: 5' 5 (1.651 m)   Body mass index is 28.79 kg/m.     04/21/2024    2:20 PM 04/15/2024    9:44 AM 02/27/2024    2:56 PM 07/14/2023    6:53 PM 05/22/2023   11:46 AM 04/09/2023    1:11 PM 12/20/2021   11:39 AM  Advanced Directives  Does Patient Have a Medical Advance Directive? Yes Yes Yes No Yes Yes Yes  Type of Advance Directive Living will Living will Healthcare Power of [place];Living will  Healthcare Power of [place];Living will Living will   Does patient want to make changes to medical advance directive? No - Patient declined     No - Patient declined   Copy of Healthcare Power of Attorney in Chart?   No - copy requested  No - copy requested    Would patient like information on creating a medical advance directive?    No - Patient declined       Current Medications (verified) Outpatient Encounter Medications as of 04/21/2024  Medication Sig   albuterol  (VENTOLIN  HFA) 108 (90 Base) MCG/ACT inhaler Inhale 2 puffs into the lungs every 6 (six) hours as needed for wheezing or shortness of breath.   ALPRAZolam  (XANAX ) 0.25 MG tablet Take 1 tablet (0.25 mg total) by mouth at bedtime as needed for anxiety.   azithromycin  (ZITHROMAX ) 250 MG tablet Take 250 mg by mouth daily.   cetirizine (ZYRTEC) 10 MG tablet Take 10 mg by mouth daily as needed.   Cholecalciferol 50 MCG (2000 UT) TABS Take 2,000 Units by mouth daily.    cyanocobalamin  1000 MCG tablet Take 1,000 mcg by mouth 3 (three) times a week.   Ferrous Sulfate (IRON PO) Take by mouth. Nature made   finasteride (PROSCAR) 5 MG tablet Take 5 mg by mouth daily.   furosemide  (LASIX ) 20 MG tablet Take 20 mg by mouth.   ipratropium (ATROVENT ) 0.03 % nasal spray Place 1 spray into the nose 4 (four) times daily as needed.   levothyroxine  (SYNTHROID ) 200 MCG tablet Take 1 tablet (200 mcg total) by mouth daily before breakfast. BRAND NAME ONLY   meloxicam  (MOBIC ) 15 MG tablet Take 1 tablet (15 mg total) by mouth daily.   minoxidil (LONITEN) 2.5 MG tablet Take 1.25 mg by mouth daily.   montelukast  (SINGULAIR ) 10 MG tablet Take 1 tablet (10 mg total) by mouth at bedtime.   simvastatin  (ZOCOR ) 40 MG tablet TAKE 1 TABLET BY MOUTH AT BEDTIME   TRELEGY ELLIPTA  200-62.5-25 MCG/ACT AEPB Inhale 1 puff into the lungs.   [DISCONTINUED] albuterol  (PROVENTIL ) (2.5 MG/3ML) 0.083% nebulizer solution Take 3 mLs (2.5 mg total) by nebulization every 6 (six) hours as needed for wheezing or shortness of breath.   [DISCONTINUED] cyclobenzaprine  (FLEXERIL ) 10 MG tablet Take 0.5-1 tablets (5-10 mg total) by mouth 3 (three) times daily as needed for muscle spasms. Caution: can cause drowsiness   [DISCONTINUED] predniSONE  (  DELTASONE ) 5 MG tablet Take 5 mg by mouth.   No facility-administered encounter medications on file as of 04/21/2024.    Allergies (verified) Penicillins and Sulfa antibiotics   History: Past Medical History:  Diagnosis Date   Allergy 1964   Anxiety 1986   Arthritis    Asthma    COPD (chronic obstructive pulmonary disease) (HCC)    Emphysema of lung (HCC) 2016   Hyperlipidemia    Thyroid  disease    Past Surgical History:  Procedure Laterality Date   CESAREAN SECTION     EYE SURGERY  2018   Cataracts   Family History  Problem Relation Age of Onset   Lung disease Father    Early death Father    Anxiety disorder Mother    Arthritis Mother    Anxiety  disorder Son    Arthritis Sister    Arthritis Sister    Social History   Socioeconomic History   Marital status: Widowed    Spouse name: Not on file   Number of children: 2   Years of education: Bachelor's degree   Highest education level: Bachelor's degree (e.g., BA, AB, BS)  Occupational History   Occupation: Retired  Tobacco Use   Smoking status: Former    Current packs/day: 0.00    Average packs/day: 2.0 packs/day for 38.7 years (77.5 ttl pk-yrs)    Types: Cigarettes    Start date: 10/08/1961    Quit date: 07/07/2000    Years since quitting: 23.8   Smokeless tobacco: Never  Vaping Use   Vaping status: Never Used  Substance and Sexual Activity   Alcohol use: Not Currently   Drug use: Never   Sexual activity: Not Currently    Birth control/protection: Abstinence, Post-menopausal  Other Topics Concern   Not on file  Social History Narrative   Lives alone. Her daughter lives in [place]. She has two grandchildren. She enjoys doing yoga. Her son committed suicide in 2023.   Social Drivers of Corporate investment banker Strain: Low Risk  (04/21/2024)   Overall Financial Resource Strain (CARDIA)    Difficulty of Paying Living Expenses: Not hard at all  Food Insecurity: No Food Insecurity (04/21/2024)   Hunger Vital Sign    Worried About Running Out of Food in the Last Year: Never true    Ran Out of Food in the Last Year: Never true  Transportation Needs: No Transportation Needs (04/21/2024)   PRAPARE - Administrator, Civil Service (Medical): No    Lack of Transportation (Non-Medical): No  Physical Activity: Insufficiently Active (04/21/2024)   Exercise Vital Sign    Days of Exercise per Week: 7 days    Minutes of Exercise per Session: 10 min  Stress: No Stress Concern Present (04/21/2024)   [employer] of Occupational Health - Occupational Stress Questionnaire    Feeling of Stress: Not at all  Social Connections: Moderately Integrated (04/21/2024)    Social Connection and Isolation Panel    Frequency of Communication with Friends and Family: More than three times a week    Frequency of Social Gatherings with Friends and Family: Not on file    Attends Religious Services: More than 4 times per year    Active Member of [employer] or Organizations: Yes    Attends Banker Meetings: More than 4 times per year    Marital Status: Widowed  Recent Concern: Social Connections - Socially Isolated (03/26/2024)   Social Connection and Isolation Panel  Frequency of Communication with Friends and Family: Never    Frequency of Social Gatherings with Friends and Family: Twice a week    Attends Religious Services: More than 4 times per year    Active Member of [employer] or Organizations: No    Attends Banker Meetings: Not on file    Marital Status: Widowed    Tobacco Counseling Counseling given: Not Answered   Clinical Intake:  Pre-visit preparation completed: Yes  Pain : No/denies pain     BMI - recorded: 28.79 Nutritional Status: BMI 25 -29 Overweight Nutritional Risks: None Diabetes: No  How often do you need to have someone help you when you read instructions, pamphlets, or other written materials from your doctor or pharmacy?: 1 - Never What is the last grade level you completed in school?: 16  Interpreter Needed?: No      Activities of Daily Living    04/21/2024    1:57 PM 04/17/2024   10:06 AM  In your present state of health, do you have any difficulty performing the following activities:  Hearing? 0 0  Vision? 0 0  Difficulty concentrating or making decisions? 0 0  Walking or climbing stairs? 1 1  Dressing or bathing? 0 0  Doing errands, shopping? 0 0  Preparing Food and eating ? N N  Using the Toilet? N N  In the past six months, have you accidently leaked urine? Y Y  Do you have problems with loss of bowel control? N N  Managing your Medications? N N  Managing your Finances? N N  Housekeeping  or managing your Housekeeping? N N    Patient Care Team: Willo Mini, NP as PCP - General (Nurse Practitioner)  Indicate any recent Medical Services you may have received from other than [facility] providers in the past year (date may be approximate).     Assessment:   This is a routine wellness examination for [employer].  Hearing/Vision screen No results found.   Goals Addressed             This Visit's Progress    Patient Stated       Patient states she would like to stay healthy and active.        Depression Screen    04/21/2024    2:18 PM 04/15/2024    9:45 AM 05/14/2023    3:15 PM 04/09/2023    1:11 PM 02/07/2023    2:05 PM 08/09/2022    3:20 PM 06/08/2022   10:56 AM  PHQ 2/9 Scores  PHQ - 2 Score 0 1 0 1 0 0 1  PHQ- 9 Score  2         Fall Risk    04/21/2024    2:21 PM 04/21/2024   10:27 AM 04/17/2024   10:06 AM 04/15/2024    9:40 AM 05/14/2023    3:15 PM  Fall Risk   Falls in the past year? 0 0 0 0 0  Number falls in past yr: 0 0 0 0 0  Injury with Fall? 0 0 0 0 0  Risk for fall due to : No Fall Risks Impaired vision  Impaired vision No Fall Risks  Follow up Falls evaluation completed Falls evaluation completed;Education provided;Falls prevention discussed  Falls evaluation completed;Education provided;Falls prevention discussed Falls evaluation completed    MEDICARE RISK AT HOME: Medicare Risk at Home Any stairs in or around the home?: No If so, are there any without handrails?: No Home free of  loose throw rugs in walkways, pet beds, electrical cords, etc?: No Adequate lighting in your home to reduce risk of falls?: Yes Life alert?: No Use of a cane, walker or w/c?: No Grab bars in the bathroom?: No Shower chair or bench in shower?: No Elevated toilet seat or a handicapped toilet?: No  TIMED UP AND GO:  Was the test performed?  Yes  Length of time to ambulate 10 feet: 9 sec Gait steady and fast without use of assistive device    Cognitive Function:         04/21/2024    2:22 PM 04/09/2023    1:15 PM 07/05/2021    5:13 PM  6CIT Screen  What Year? 0 points 0 points 0 points  What month? 0 points 0 points 0 points  What time? 0 points 0 points 0 points  Count back from 20 0 points 0 points 0 points  Months in reverse 0 points 0 points 0 points  Repeat phrase 2 points 2 points 0 points  Total Score 2 points 2 points 0 points    Immunizations Immunization History  Administered Date(s) Administered   Influenza Split 08/02/2010, 08/09/2016, 07/15/2017, 07/18/2018   Influenza, High Dose Seasonal PF 07/07/2015, 08/10/2016, 07/23/2019, 09/27/2023   Influenza, Quadrivalent, Recombinant, Inj, Pf 07/18/2018   Influenza-Unspecified 06/26/2011, 07/21/2020, 07/12/2022   PFIZER(Purple Top)SARS-COV-2 Vaccination 11/02/2019, 11/23/2019, 07/05/2020, 07/01/2021, 05/08/2022   Pfizer(Comirnaty )Fall Seasonal Vaccine 12 years and older 08/09/2022, 10/10/2023   Pneumococcal Conjugate-13 10/08/2005, 06/21/2014, 07/18/2018, 07/22/2018   Pneumococcal Polysaccharide-23 06/04/2019   Pneumococcal-Unspecified 10/08/2005   Tdap 03/01/2018   Zoster Recombinant(Shingrix) 12/16/2017, 05/05/2018   Zoster, Live 08/02/2011    TDAP status: Up to date  Flu Vaccine status: Up to date  Pneumococcal vaccine status: Up to date  Covid-19 vaccine status: Completed vaccines  Qualifies for Shingles Vaccine? Yes   Zostavax completed Yes   Shingrix Completed?: Yes  Screening Tests Health Maintenance  Topic Date Due   COVID-19 Vaccine (8 - Pfizer risk 2024-25 season) 04/08/2024   INFLUENZA VACCINE  05/08/2024   Medicare Annual Wellness (AWV)  04/21/2025   DTaP/Tdap/Td (2 - Td or Tdap) 03/01/2028   DEXA SCAN  02/18/2034   Pneumococcal Vaccine: 50+ Years  Completed   Zoster Vaccines- Shingrix  Completed   Hepatitis B Vaccines  Aged Out   HPV VACCINES  Aged Out   Meningococcal B Vaccine  Aged Out    Health Maintenance  Health Maintenance Due  Topic Date Due    COVID-19 Vaccine (8 - Pfizer risk 2024-25 season) 04/08/2024    Colorectal cancer screening: No longer required.   Mammogram status: Completed 09/26/2023. Repeat every year  Bone Density status: Completed 02/19/2024. Results reflect: Bone density results: OSTEOPENIA. Repeat every 2 years.  Lung Cancer Screening: (Low Dose CT Chest recommended if Age 44-80 years, 20 pack-year currently smoking OR have quit w/in 15years.) does not qualify.   Lung Cancer Screening Referral: n/a  Additional Screening:  Hepatitis C Screening: does not qualify; Completed   Vision Screening: Recommended annual ophthalmology exams for early detection of glaucoma and other disorders of the eye. Is the patient up to date with their annual eye exam?  Yes  Who is the provider or what is the name of the office in which the patient attends annual eye exams? [email or web address] If pt is not established with a provider, would they like to be referred to a provider to establish care? N/a.   Dental Screening: Recommended annual dental exams for proper oral  hygiene   Community Resource Referral / Chronic Care Management: CRR required this visit?  No   CCM required this visit?  No     Plan:     I have personally reviewed and noted the following in the patient's chart:   Medical and social history Use of alcohol, tobacco or illicit drugs  Current medications and supplements including opioid prescriptions. Patient is not currently taking opioid prescriptions. Functional ability and status Nutritional status Physical activity Advanced directives List of other physicians Hospitalizations, surgeries, and ER # 1 visits in previous 12 months.  Vitals Screenings to include cognitive, depression, and falls Referrals and appointments  In addition, I have reviewed and discussed with patient certain preventive protocols, quality metrics, and best practice recommendations. A written personalized care plan for preventive  services as well as general preventive health recommendations were provided to patient.     Bonny Jon Mayor, CMA   04/21/2024   After Visit Summary: (In Person-Printed) AVS printed and given to the patient  Nurse Notes:    Timmie Calix is a 83 y.o. female patient of Willo Mini, NP who had a Medicare Annual Wellness Visit today. Kenyada is Retired and lives alone. She had 2 children but one has deceased. She reports that she is socially active and does interact with friends/family regularly. She is moderately physically active and enjoys yoga.

## 2024-04-21 NOTE — Progress Notes (Signed)
 Daily Session Note  Patient Details  Name: Aalaiyah Yassin MRN: [id number] Date of Birth: 20-Feb-1941 Referring Provider:   [name] Pulmonary Rehab Walk Test from 04/15/2024 in [facility] for [facility], [facility], & Lung [facility]  Referring Provider McQuaid    Encounter Date: 04/21/2024  Check In:  Session Check In - 04/21/24 1026       Check-In   Supervising physician immediately available to respond to emergencies [facility] MD immediately available    Physician(s) Rosabel Mose, NP    Location [facility]    Staff Present Ronal Levin, RN, Maud Moats, MS, ACSM-CEP, Exercise Physiologist;Leiann Sporer Midge BS, ACSM-CEP, Exercise Physiologist;Casey Claudene, RT    Virtual Visit No    Medication changes reported     No    Tobacco Cessation No Change    Warm-up and Cool-down Performed as group-led instruction    Resistance Training Performed Yes    VAD Patient? No    PAD/SET Patient? No      Pain Assessment   Currently in Pain? No/denies          Capillary Blood Glucose: No results found for this or any previous visit (from the past 24 hours).    Social History   Tobacco Use  Smoking Status Former   Current packs/day: 0.00   Average packs/day: 2.0 packs/day for 38.7 years (77.5 ttl pk-yrs)   Types: Cigarettes   Start date: 10/08/1961   Quit date: 07/07/2000   Years since quitting: 23.8  Smokeless Tobacco Never    Goals Met:  Independence with exercise equipment Exercise tolerated well No report of concerns or symptoms today Strength training completed today  Goals Unmet:  Not Applicable  Comments: Service time is from 1012 to 1140.    Dr. Slater Staff is Medical Director for Pulmonary Rehab at [facility].

## 2024-04-22 ENCOUNTER — Other Ambulatory Visit: Payer: Self-pay | Admitting: Medical-Surgical

## 2024-04-22 DIAGNOSIS — E7800 Pure hypercholesterolemia, unspecified: Secondary | ICD-10-CM

## 2024-04-23 ENCOUNTER — Encounter (HOSPITAL_COMMUNITY)
Admission: RE | Admit: 2024-04-23 | Discharge: 2024-04-23 | Disposition: A | Discharge status: Home / Self Care | Source: Ambulatory Visit | Attending: Pulmonary Disease | Admitting: Pulmonary Disease

## 2024-04-23 DIAGNOSIS — J9611 Chronic respiratory failure with hypoxia: Secondary | ICD-10-CM | POA: Diagnosis not present

## 2024-04-23 DIAGNOSIS — J432 Centrilobular emphysema: Secondary | ICD-10-CM

## 2024-04-23 NOTE — Progress Notes (Signed)
 Daily Session Note  Patient Details  Name: Chasitee Zenker MRN: [id number] Date of Birth: November 12, 1940 Referring Provider:   [name] Pulmonary Rehab Walk Test from 04/15/2024 in [facility] for Heart, Vascular, & Lung Health  Referring Provider McQuaid    Encounter Date: 04/23/2024  Check In:  Session Check In - 04/23/24 1341       Check-In   Supervising physician immediately available to respond to emergencies [facility] MD immediately available    Physician(s) Lum Louis, NP    [facility]    Staff Present Ronal Levin, RN, Maud Moats, MS, ACSM-CEP, Exercise Physiologist;Randi Midge HECKLE, ACSM-CEP, Exercise Physiologist;Lorrena Goranson Claudene Meline Belarus, RD, LDN    Virtual Visit No    Medication changes reported     No    Tobacco Cessation No Change    Warm-up and Cool-down Performed as group-led instruction    Resistance Training Performed Yes    VAD Patient? No    PAD/SET Patient? No      Pain Assessment   Currently in Pain? No/denies    Multiple Pain Sites No          Capillary Blood Glucose: No results found for this or any previous visit (from the past 24 hours).    Social History   Tobacco Use  Smoking Status Former   Current packs/day: 0.00   Average packs/day: 2.0 packs/day for 38.7 years (77.5 ttl pk-yrs)   Types: Cigarettes   Start date: 10/08/1961   Quit date: 07/07/2000   Years since quitting: 23.8  Smokeless Tobacco Never    Goals Met:  Proper associated with RPD/PD & O2 Sat Independence with exercise equipment Exercise tolerated well No report of concerns or symptoms today Strength training completed today  Goals Unmet:  Not Applicable  Comments: Service time is from 1315 to 1451.    Dr. Slater Staff is Medical Director for Pulmonary Rehab at [facility].

## 2024-04-28 ENCOUNTER — Encounter (HOSPITAL_COMMUNITY)
Admission: RE | Admit: 2024-04-28 | Discharge: 2024-04-28 | Disposition: A | Discharge status: Home / Self Care | Source: Ambulatory Visit | Attending: Pulmonary Disease | Admitting: Pulmonary Disease

## 2024-04-28 VITALS — Wt 173.5 lb

## 2024-04-28 DIAGNOSIS — J432 Centrilobular emphysema: Secondary | ICD-10-CM

## 2024-04-28 DIAGNOSIS — J9611 Chronic respiratory failure with hypoxia: Secondary | ICD-10-CM | POA: Diagnosis not present

## 2024-04-28 NOTE — Progress Notes (Signed)
 Daily Session Note  Patient Details  Name: Christine Barry MRN: [id number] Date of Birth: Feb 13, 1941 Referring Provider:   [name] Pulmonary Rehab Walk Test from 04/15/2024 in [facility] for [facility], Vascular, & Lung Health  Referring Provider McQuaid    Encounter Date: 04/28/2024  Check In:  Session Check In - 04/28/24 1436       Check-In   Supervising physician immediately available to respond to emergencies [facility] MD immediately available    Physician(s) Rosaline Bane, NP    Location [facility]    Staff Present Ronal Levin, RN, Maud Moats, MS, ACSM-CEP, Exercise Physiologist;Yecheskel Kurek Claudene, RT    Virtual Visit No    Medication changes reported     No    Tobacco Cessation No Change    Warm-up and Cool-down Performed as group-led instruction    Resistance Training Performed Yes    VAD Patient? No    PAD/SET Patient? No      Pain Assessment   Currently in Pain? No/denies    Multiple Pain Sites No          Capillary Blood Glucose: No results found for this or any previous visit (from the past 24 hours).   Exercise Prescription Changes - 04/28/24 1500       Response to Exercise   Blood Pressure (Admit) 118/70    Blood Pressure (Exercise) 120/84    Blood Pressure (Exit) 116/66    Heart Rate (Admit) 90 bpm    Heart Rate (Exercise) 104 bpm    Heart Rate (Exit) 91 bpm    Oxygen  Saturation (Admit) 96 %    Oxygen  Saturation (Exercise) 91 %    Oxygen  Saturation (Exit) 93 %    Rating of Perceived Exertion (Exercise) 12    Perceived Dyspnea (Exercise) 2    Duration Continue with 30 min of aerobic exercise without signs/symptoms of physical distress.    Intensity THRR unchanged      Progression   Progression Continue to progress workloads to maintain intensity without signs/symptoms of physical distress.      Resistance Training   Training Prescription Yes    Weight red bands    Reps 10-15    Time 10 Minutes       Oxygen    Oxygen  Continuous    Liters 3      Treadmill   MPH 1.5    Grade 1    Minutes 15    METs 2.2      NuStep   Level 2    Minutes 15    METs 2.2      Oxygen    Maintain Oxygen  Saturation 88% or higher          Social History   Tobacco Use  Smoking Status Former   Current packs/day: 0.00   Average packs/day: 2.0 packs/day for 38.7 years (77.5 ttl pk-yrs)   Types: Cigarettes   Start date: 10/08/1961   Quit date: 07/07/2000   Years since quitting: 23.8  Smokeless Tobacco Never    Goals Met:  Proper associated with RPD/PD & O2 Sat Independence with exercise equipment Exercise tolerated well No report of concerns or symptoms today Strength training completed today  Goals Unmet:  Not Applicable  Comments: Service time is from 1316 to 1444.    Dr. Slater Staff is Medical Director for Pulmonary Rehab at [facility].

## 2024-04-30 ENCOUNTER — Encounter (HOSPITAL_COMMUNITY)
Admission: RE | Admit: 2024-04-30 | Discharge: 2024-04-30 | Disposition: A | Discharge status: Home / Self Care | Source: Ambulatory Visit | Attending: Pulmonary Disease

## 2024-04-30 DIAGNOSIS — J9611 Chronic respiratory failure with hypoxia: Secondary | ICD-10-CM | POA: Diagnosis not present

## 2024-04-30 DIAGNOSIS — J432 Centrilobular emphysema: Secondary | ICD-10-CM

## 2024-04-30 NOTE — Progress Notes (Signed)
 Daily Session Note  Patient Details  Name: Christine Barry MRN: [id number] Date of Birth: 27-Nov-1940 Referring Provider:   [name] Pulmonary Rehab Walk Test from 04/15/2024 in [facility]  Referring Provider McQuaid    Encounter Date: 04/30/2024  Check In:  Session Check In - 04/30/24 1502       Check-In   Supervising physician immediately available to respond to emergencies [facility] MD immediately available    Physician(s) Lum Louis, NP    Location [facility]    Staff Present Ronal Levin, RN, Maud Moats, MS, ACSM-CEP, Exercise Physiologist;Makynzie Dobesh Claudene, RT    Virtual Visit No    Medication changes reported     No    Fall or balance concerns reported    No    Tobacco Cessation No Change    Warm-up and Cool-down Performed as group-led instruction    Resistance Training Performed Yes    VAD Patient? No    PAD/SET Patient? No      Pain Assessment   Currently in Pain? No/denies    Multiple Pain Sites No          Capillary Blood Glucose: No results found for this or any previous visit (from the past 24 hours).    Social History   Tobacco Use  Smoking Status Former   Current packs/day: 0.00   Average packs/day: 2.0 packs/day for 38.7 years (77.5 ttl pk-yrs)   Types: Cigarettes   Start date: 10/08/1961   Quit date: 07/07/2000   Years since quitting: 23.8  Smokeless Tobacco Never    Goals Met:  Proper associated with RPD/PD & O2 Sat Independence with exercise equipment Exercise tolerated well No report of concerns or symptoms today Strength training completed today  Goals Unmet:  Not Applicable  Comments: Service time is from 1305 to 1443.    Dr. Slater Staff is Medical Director for Pulmonary Rehab at [facility].

## 2024-05-05 ENCOUNTER — Encounter (HOSPITAL_COMMUNITY)
Admission: RE | Admit: 2024-05-05 | Discharge: 2024-05-05 | Disposition: A | Discharge status: Home / Self Care | Source: Ambulatory Visit | Attending: Pulmonary Disease | Admitting: Pulmonary Disease

## 2024-05-05 DIAGNOSIS — J9611 Chronic respiratory failure with hypoxia: Secondary | ICD-10-CM | POA: Diagnosis not present

## 2024-05-05 DIAGNOSIS — J432 Centrilobular emphysema: Secondary | ICD-10-CM

## 2024-05-05 NOTE — Progress Notes (Signed)
 Daily Session Note  Patient Details  Name: Christine Barry MRN: [id number] Date of Birth: April 03, 1941 Referring Provider:   [name] Pulmonary Rehab Walk Test from 04/15/2024 in [facility], [facility], [facility]  Referring Provider McQuaid    Encounter Date: 05/05/2024  Check In:  Session Check In - 05/05/24 1328       Check-In   Supervising physician immediately available to respond to emergencies [facility] MD immediately available    Physician(s) Barnie Hila, NP    Location [facility]    Staff Present Ronal Levin, RN, Maud Moats, MS, ACSM-CEP, Exercise Physiologist;Casey Claudene Idelia Aris BS, ACSM-CEP, Exercise Physiologist    Virtual Visit No    Medication changes reported     No    Fall or balance concerns reported    No    Tobacco Cessation No Change    Warm-up and Cool-down Performed as group-led instruction    Resistance Training Performed Yes    VAD Patient? No    PAD/SET Patient? No      Pain Assessment   Currently in Pain? No/denies    Multiple Pain Sites No          Capillary Blood Glucose: No results found for this or any previous visit (from the past 24 hours).    Social History   Tobacco Use  Smoking Status Former   Current packs/day: 0.00   Average packs/day: 2.0 packs/day for 38.7 years (77.5 ttl pk-yrs)   Types: Cigarettes   Start date: 10/08/1961   Quit date: 07/07/2000   Years since quitting: 23.8  Smokeless Tobacco Never    Goals Met:  Proper associated with RPD/PD & O2 Sat Exercise tolerated well No report of concerns or symptoms today Strength training completed today  Goals Unmet:  Not Applicable  Comments: Service time is from 1310 to 1443.    Dr. Slater Staff is Medical Director for Pulmonary Rehab at [facility].

## 2024-05-07 ENCOUNTER — Encounter (HOSPITAL_COMMUNITY)
Admission: RE | Admit: 2024-05-07 | Discharge: 2024-05-07 | Disposition: A | Discharge status: Home / Self Care | Source: Ambulatory Visit | Attending: Pulmonary Disease | Admitting: Pulmonary Disease

## 2024-05-07 DIAGNOSIS — J9611 Chronic respiratory failure with hypoxia: Secondary | ICD-10-CM

## 2024-05-07 DIAGNOSIS — J432 Centrilobular emphysema: Secondary | ICD-10-CM

## 2024-05-07 NOTE — Progress Notes (Signed)
 Daily Session Note  Patient Details  Name: Christine Barry MRN: [id number] Date of Birth: 1940-11-02 Referring Provider:   [name] Pulmonary Rehab Walk Test from 04/15/2024 in [facility]  Referring Provider McQuaid    Encounter Date: 05/07/2024  Check In:  Session Check In - 05/07/24 1329       Check-In   Supervising physician immediately available to respond to emergencies [facility] MD immediately available    Physician(s) Josefa Beauvais, NP    [facility]    Staff Present Ronal Levin, RN, Maud Moats, MS, ACSM-CEP, Exercise Physiologist;Casey Claudene Idelia Aris BS, ACSM-CEP, Exercise Physiologist    Virtual Visit No    Medication changes reported     No    Fall or balance concerns reported    No    Tobacco Cessation No Change    Warm-up and Cool-down Performed as group-led instruction    Resistance Training Performed Yes    VAD Patient? No    PAD/SET Patient? No      Pain Assessment   Currently in Pain? No/denies    Multiple Pain Sites No          Capillary Blood Glucose: No results found for this or any previous visit (from the past 24 hours).    Social History   Tobacco Use  Smoking Status Former   Current packs/day: 0.00   Average packs/day: 2.0 packs/day for 38.7 years (77.5 ttl pk-yrs)   Types: Cigarettes   Start date: 10/08/1961   Quit date: 07/07/2000   Years since quitting: 23.8  Smokeless Tobacco Never    Goals Met:  Exercise tolerated well No report of concerns or symptoms today Strength training completed today  Goals Unmet:  Not Applicable  Comments: Service time is from 1308 to 1448    Dr. Slater Staff is Medical Director for Pulmonary Rehab at [facility].

## 2024-05-12 ENCOUNTER — Other Ambulatory Visit: Payer: Self-pay | Admitting: Medical-Surgical

## 2024-05-12 ENCOUNTER — Encounter (HOSPITAL_COMMUNITY)
Admission: RE | Admit: 2024-05-12 | Discharge: 2024-05-12 | Disposition: A | Discharge status: Home / Self Care | Source: Ambulatory Visit | Attending: Pulmonary Disease | Admitting: Pulmonary Disease

## 2024-05-12 VITALS — Wt 174.6 lb

## 2024-05-12 DIAGNOSIS — J432 Centrilobular emphysema: Secondary | ICD-10-CM

## 2024-05-12 DIAGNOSIS — J302 Other seasonal allergic rhinitis: Secondary | ICD-10-CM

## 2024-05-12 DIAGNOSIS — J9611 Chronic respiratory failure with hypoxia: Secondary | ICD-10-CM | POA: Insufficient documentation

## 2024-05-13 NOTE — Progress Notes (Signed)
 Pulmonary Individual Treatment Plan  Patient Details  Name: Christine Barry MRN: [id number] Date of Birth: 08-Feb-1941 Referring Provider:   [name] Pulmonary Rehab Walk Test from 04/15/2024 in [facility] for [facility], [facility]  Referring Provider McQuaid    Initial Encounter Date:  Flowsheet Row Pulmonary Rehab Walk Test from 04/15/2024 in [facility], [facility]ascular, [facility]  Date 04/15/24    Visit Diagnosis: Chronic respiratory failure with hypoxia (HCC)  Centrilobular emphysema (HCC)  Patient's Home Medications on Admission:   Current Outpatient Medications:    albuterol  (VENTOLIN  HFA) 108 (90 Base) MCG/ACT inhaler, Inhale 2 puffs into the lungs every 6 (six) hours as needed for wheezing or shortness of breath., Disp: 8 g, Rfl: 1   ALPRAZolam  (XANAX ) 0.25 MG tablet, Take 1 tablet (0.25 mg total) by mouth at bedtime as needed for anxiety., Disp: 15 tablet, Rfl: 0   azithromycin  (ZITHROMAX ) 250 MG tablet, Take 250 mg by mouth daily., Disp: , Rfl:    cetirizine (ZYRTEC) 10 MG tablet, Take 10 mg by mouth daily as needed., Disp: , Rfl:    Cholecalciferol 50 MCG (2000 UT) TABS, Take 2,000 Units by mouth daily., Disp: , Rfl:    cyanocobalamin  1000 MCG tablet, Take 1,000 mcg by mouth 3 (three) times a week., Disp: , Rfl:    Ferrous Sulfate (IRON PO), Take by mouth. Nature made, Disp: , Rfl:    finasteride (PROSCAR) 5 MG tablet, Take 5 mg by mouth daily., Disp: , Rfl:    furosemide  (LASIX ) 20 MG tablet, Take 20 mg by mouth., Disp: , Rfl:    ipratropium (ATROVENT ) 0.03 % nasal spray, Place 1 spray into the nose 4 (four) times daily as needed., Disp: , Rfl:    levothyroxine  (SYNTHROID ) 200 MCG tablet, Take 1 tablet (200 mcg total) by mouth daily before breakfast. BRAND NAME ONLY, Disp: 90 tablet, Rfl: 1   meloxicam  (MOBIC ) 15 MG tablet, Take 1 tablet (15 mg total) by mouth daily., Disp: 90 tablet, Rfl: 3    minoxidil (LONITEN) 2.5 MG tablet, Take 1.25 mg by mouth daily., Disp: , Rfl:    montelukast  (SINGULAIR ) 10 MG tablet, TAKE 1 TABLET BY MOUTH AT BEDTIME, Disp: 90 tablet, Rfl: 1   simvastatin  (ZOCOR ) 40 MG tablet, TAKE 1 TABLET BY MOUTH EVERY DAY AT BEDTIME, Disp: 30 tablet, Rfl: 0   TRELEGY ELLIPTA  200-62.5-25 MCG/ACT AEPB, Inhale 1 puff into the lungs., Disp: , Rfl:   Past Medical History: Past Medical History:  Diagnosis Date   Allergy 1964   Anxiety 1986   Arthritis    Asthma    COPD (chronic obstructive pulmonary disease) (HCC)    Emphysema of lung (HCC) 2016   Hyperlipidemia    Thyroid  disease     Tobacco Use: Social History   Tobacco Use  Smoking Status Former   Current packs/day: 0.00   Average packs/day: 2.0 packs/day for 38.7 years (77.5 ttl pk-yrs)   Types: Cigarettes   Start date: 10/08/1961   Quit date: 07/07/2000   Years since quitting: 23.8  Smokeless Tobacco Never    Labs: Review Flowsheet  More data may exist      Latest Ref Rng & Units 06/08/2021 07/20/2022 02/07/2023 07/16/2023 02/10/2024  Labs for ITP Cardiac and Pulmonary Rehab  Cholestrol <200 mg/dL 793  766  807  - -  LDL (calc) mg/dL (calc) 893  876  93  - -  HDL-C > OR =  50 mg/dL 72  82  70  - -  Trlycerides <150 mg/dL 831  839  797  - -  Hemoglobin A1c 4.0 - 5.6 % - - 6.0  6.0  5.5     Capillary Blood Glucose: No results found for: GLUCAP   Pulmonary Assessment Scores:  Pulmonary Assessment Scores     Row Name 04/15/24 0952         ADL UCSD   ADL Phase Entry     SOB Score total 34       CAT Score   CAT Score 15       mMRC Score   mMRC Score 3       UCSD: Self-administered rating of dyspnea associated with activities of daily living (ADLs) 6-point scale (0 = not at all to 5 = maximal or unable to do because of breathlessness)  Scoring Scores range from 0 to 120.  Minimally important difference is 5 units  CAT: CAT can identify the health impairment of COPD patients and is  better correlated with disease progression.  CAT has a scoring range of zero to 40. The CAT score is classified into four groups of low (less than 10), medium (10 - 20), high (21-30) and very high (31-40) based on the impact level of disease on health status. A CAT score over 10 suggests significant symptoms.  A worsening CAT score could be explained by an exacerbation, poor medication adherence, poor inhaler technique, or progression of COPD or comorbid conditions.  CAT MCID is 2 points  mMRC: mMRC (Modified Medical Research Council) Dyspnea Scale is used to assess the degree of baseline functional disability in patients of respiratory disease due to dyspnea. No minimal important difference is established. A decrease in score of 1 point or greater is considered a positive change.   Pulmonary Function Assessment:  Pulmonary Function Assessment - 04/15/24 0955       Breath   Bilateral Breath Sounds Clear    Shortness of Breath Yes;Limiting activity          Exercise Target Goals: Exercise Program Goal: Individual exercise prescription set using results from initial 6 min walk test and THRR while considering  patient's activity barriers and safety.   Exercise Prescription Goal: Initial exercise prescription builds to 30-45 minutes a day of aerobic activity, 2-3 days per week.  Home exercise guidelines will be given to patient during program as part of exercise prescription that the participant will acknowledge.  Activity Barriers & Risk Stratification:  Activity Barriers & Cardiac Risk Stratification - 04/15/24 1104       Activity Barriers & Cardiac Risk Stratification   Activity Barriers Arthritis;Deconditioning;Muscular Weakness;Shortness of Breath          6 Minute Walk:  6 Minute Walk     Row Name 04/15/24 1056         6 Minute Walk   Phase Initial     Distance 985 feet     Walk Time 6 minutes     # of Rest Breaks 1  2:40-2:50     MPH 1.87     METS 1.93     RPE  11     Perceived Dyspnea  1     VO2 Peak 6.75     Symptoms No     Resting HR 89 bpm     Resting BP 124/58     Resting Oxygen  Saturation  92 %     Exercise Oxygen  Saturation  during 6  min walk 87 %     Max Ex. HR 105 bpm     Max Ex. BP 160/80     2 Minute Post BP 120/80       Interval HR   1 Minute HR 90     2 Minute HR 101     3 Minute HR 90     4 Minute HR 93     5 Minute HR 100     6 Minute HR 105     2 Minute Post HR 86     Interval Heart Rate? Yes       Interval Oxygen    Interval Oxygen ? Yes     Baseline Oxygen  Saturation % 92 %     1 Minute Oxygen  Saturation % 93 %     1 Minute Liters of Oxygen  0 L     2 Minute Oxygen  Saturation % 87 %     2 Minute Liters of Oxygen  0 L  increased to 1L     3 Minute Oxygen  Saturation % 89 %  87% at 2:40     3 Minute Liters of Oxygen  1 L  increased to 2L     4 Minute Oxygen  Saturation % 94 %     4 Minute Liters of Oxygen  2 L     5 Minute Oxygen  Saturation % 93 %     5 Minute Liters of Oxygen  2 L     6 Minute Oxygen  Saturation % 90 %     6 Minute Liters of Oxygen  2 L     2 Minute Post Oxygen  Saturation % 95 %     2 Minute Post Liters of Oxygen  2 L        Oxygen  Initial Assessment:  Oxygen  Initial Assessment - 04/15/24 0954       Home Oxygen    Home Oxygen  Device None    Sleep Oxygen  Prescription None    Home Exercise Oxygen  Prescription None    Home Resting Oxygen  Prescription None      Initial 6 min Walk   Oxygen  Used Continuous    Liters per minute 2      Program Oxygen  Prescription   Program Oxygen  Prescription Continuous    Liters per minute 2      Intervention   Short Term Goals To learn and exhibit compliance with exercise, home and travel O2 prescription;To learn and understand importance of maintaining oxygen  saturations>88%;To learn and demonstrate proper use of respiratory medications;To learn and understand importance of monitoring SPO2 with pulse oximeter and demonstrate accurate use of the pulse oximeter.;To  learn and demonstrate proper pursed lip breathing techniques or other breathing techniques.     Long  Term Goals Exhibits compliance with exercise, home  and travel O2 prescription;Verbalizes importance of monitoring SPO2 with pulse oximeter and return demonstration;Maintenance of O2 saturations>88%;Exhibits proper breathing techniques, such as pursed lip breathing or other method taught during program session;Compliance with respiratory medication;Demonstrates proper use of MDI's          Oxygen  Re-Evaluation:  Oxygen  Re-Evaluation     Row Name 04/15/24 0954 05/07/24 0856           Program Oxygen  Prescription   Program Oxygen  Prescription -- Continuous      Liters per minute -- 3      Comments -- consistently using 3L for exercise        Home Oxygen    Home Oxygen  Device -- None      Sleep Oxygen   Prescription -- None      Home Exercise Oxygen  Prescription -- None      Home Resting Oxygen  Prescription -- None        Goals/Expected Outcomes   Short Term Goals -- To learn and exhibit compliance with exercise, home and travel O2 prescription;To learn and understand importance of maintaining oxygen  saturations>88%;To learn and demonstrate proper use of respiratory medications;To learn and understand importance of monitoring SPO2 with pulse oximeter and demonstrate accurate use of the pulse oximeter.;To learn and demonstrate proper pursed lip breathing techniques or other breathing techniques.       Long  Term Goals -- Exhibits compliance with exercise, home  and travel O2 prescription;Verbalizes importance of monitoring SPO2 with pulse oximeter and return demonstration;Maintenance of O2 saturations>88%;Exhibits proper breathing techniques, such as pursed lip breathing or other method taught during program session;Compliance with respiratory medication;Demonstrates proper use of MDI's      Comments -- 3L      Goals/Expected Outcomes Compliance and understanding of oxygen  saturation monitoring  and breathing techniques to decrease shortness of breath. Compliance and understanding of oxygen  saturation monitoring and breathing techniques to decrease shortness of breath.         Oxygen  Discharge (Final Oxygen  Re-Evaluation):  Oxygen  Re-Evaluation - 05/07/24 0856       Program Oxygen  Prescription   Program Oxygen  Prescription Continuous    Liters per minute 3    Comments consistently using 3L for exercise      Home Oxygen    Home Oxygen  Device None    Sleep Oxygen  Prescription None    Home Exercise Oxygen  Prescription None    Home Resting Oxygen  Prescription None      Goals/Expected Outcomes   Short Term Goals To learn and exhibit compliance with exercise, home and travel O2 prescription;To learn and understand importance of maintaining oxygen  saturations>88%;To learn and demonstrate proper use of respiratory medications;To learn and understand importance of monitoring SPO2 with pulse oximeter and demonstrate accurate use of the pulse oximeter.;To learn and demonstrate proper pursed lip breathing techniques or other breathing techniques.     Long  Term Goals Exhibits compliance with exercise, home  and travel O2 prescription;Verbalizes importance of monitoring SPO2 with pulse oximeter and return demonstration;Maintenance of O2 saturations>88%;Exhibits proper breathing techniques, such as pursed lip breathing or other method taught during program session;Compliance with respiratory medication;Demonstrates proper use of MDI's    Comments 3L    Goals/Expected Outcomes Compliance and understanding of oxygen  saturation monitoring and breathing techniques to decrease shortness of breath.          Initial Exercise Prescription:  Initial Exercise Prescription - 04/15/24 1000       Date of Initial Exercise RX and Referring Provider   Date 04/15/24    Referring Provider McQuaid    Expected Discharge Date 07/09/24      Oxygen    Oxygen  Continuous    Liters 2    Maintain Oxygen   Saturation 88% or higher      Treadmill   MPH 1.5    Grade 0    Minutes 15    METs 2      NuStep   Level 1    SPM 60    Minutes 15    METs 1.5      Prescription Details   Frequency (times per week) 2    Duration Progress to 30 minutes of continuous aerobic without signs/symptoms of physical distress      Intensity   THRR 40-80% of  Max Heartrate 56-11    Ratings of Perceived Exertion 11-13    Perceived Dyspnea 0-4      Progression   Progression Continue to progress workloads to maintain intensity without signs/symptoms of physical distress.      Resistance Training   Training Prescription Yes    Weight red bands    Reps 10-15          Perform Capillary Blood Glucose checks as needed.  Exercise Prescription Changes:   Exercise Prescription Changes     Row Name 04/28/24 1500 05/12/24 1500           Response to Exercise   Blood Pressure (Admit) 118/70 118/64      Blood Pressure (Exercise) 120/84 130/80      Blood Pressure (Exit) 116/66 122/70      Heart Rate (Admit) 90 bpm 81 bpm      Heart Rate (Exercise) 104 bpm 110 bpm      Heart Rate (Exit) 91 bpm 91 bpm      Oxygen  Saturation (Admit) 96 % 97 %      Oxygen  Saturation (Exercise) 91 % 90 %      Oxygen  Saturation (Exit) 93 % 92 %      Rating of Perceived Exertion (Exercise) 12 13      Perceived Dyspnea (Exercise) 2 2      Duration Continue with 30 min of aerobic exercise without signs/symptoms of physical distress. Continue with 30 min of aerobic exercise without signs/symptoms of physical distress.      Intensity THRR unchanged THRR unchanged        Progression   Progression Continue to progress workloads to maintain intensity without signs/symptoms of physical distress. Continue to progress workloads to maintain intensity without signs/symptoms of physical distress.        Resistance Training   Training Prescription Yes Yes      Weight red bands red bands      Reps 10-15 10-15      Time 10 Minutes 10  Minutes        Oxygen    Oxygen  Continuous Continuous      Liters 3 3        Treadmill   MPH 1.5 2      Grade 1 1      Minutes 15 15      METs 2.2 2.6        NuStep   Level 2 4      SPM -- 70      Minutes 15 15      METs 2.2 2.5        Oxygen    Maintain Oxygen  Saturation 88% or higher 88% or higher         Exercise Comments:   Exercise Comments     Row Name 04/21/24 1205           Exercise Comments Pt completed first day of group exercise. She walked on the treadmill 1.5 mph, 0 incline for 15 min, METs 2.15. She then exercised on the recumbent stepper for 15 min, level 2, METs 2.2. Pt tolerated well. Performed warm up and cool down without limitations, including squats. Discussed METs with good reception.          Exercise Goals and Review:   Exercise Goals     Row Name 04/15/24 1105             Exercise Goals   Increase Physical Activity Yes  Intervention Provide advice, education, support and counseling about physical activity/exercise needs.;Develop an individualized exercise prescription for aerobic and resistive training based on initial evaluation findings, risk stratification, comorbidities and participant's personal goals.       Expected Outcomes Short Term: Attend rehab on a regular basis to increase amount of physical activity.;Long Term: Add in home exercise to make exercise part of routine and to increase amount of physical activity.;Long Term: Exercising regularly at least 3-5 days a week.       Increase Strength and Stamina Yes       Intervention Provide advice, education, support and counseling about physical activity/exercise needs.;Develop an individualized exercise prescription for aerobic and resistive training based on initial evaluation findings, risk stratification, comorbidities and participant's personal goals.       Expected Outcomes Short Term: Increase workloads from initial exercise prescription for resistance, speed, and METs.;Short  Term: Perform resistance training exercises routinely during rehab and add in resistance training at home;Long Term: Improve cardiorespiratory fitness, muscular endurance and strength as measured by increased METs and functional capacity ( )       Able to understand and use rate of perceived exertion (RPE) scale Yes       Intervention Provide education and explanation on how to use RPE scale       Expected Outcomes Short Term: Able to use RPE daily in rehab to express subjective intensity level;Long Term:  Able to use RPE to guide intensity level when exercising independently       Able to understand and use Dyspnea scale Yes       Intervention Provide education and explanation on how to use Dyspnea scale       Expected Outcomes Short Term: Able to use Dyspnea scale daily in rehab to express subjective sense of shortness of breath during exertion;Long Term: Able to use Dyspnea scale to guide intensity level when exercising independently       Knowledge and understanding of Target Heart Rate Range (THRR) Yes       Intervention Provide education and explanation of THRR including how the numbers were predicted and where they are located for reference       Expected Outcomes Short Term: Able to state/look up THRR;Long Term: Able to use THRR to govern intensity when exercising independently;Short Term: Able to use daily as guideline for intensity in rehab       Understanding of Exercise Prescription Yes       Intervention Provide education, explanation, and written materials on patient's individual exercise prescription       Expected Outcomes Short Term: Able to explain program exercise prescription;Long Term: Able to explain home exercise prescription to exercise independently          Exercise Goals Re-Evaluation :  Exercise Goals Re-Evaluation     Row Name 04/15/24 1105 05/07/24 0854           Exercise Goal Re-Evaluation   Exercise Goals Review Increase Physical Activity;Able to understand  and use Dyspnea scale;Understanding of Exercise Prescription;Increase Strength and Stamina;Knowledge and understanding of Target Heart Rate Range (THRR);Able to understand and use rate of perceived exertion (RPE) scale Increase Physical Activity;Able to understand and use Dyspnea scale;Understanding of Exercise Prescription;Increase Strength and Stamina;Knowledge and understanding of Target Heart Rate Range (THRR);Able to understand and use rate of perceived exertion (RPE) scale      Comments Sharman is scheduled to begin exercise on 7/15. Will continue to monitor and progress as able. Sharman has completed 5 exercise sessions  with good attendance. She is walking on the treadmill for 15 min, 1.5 mph, 1% incline, METs 2.2. She then is exercising on the recumbent stepper for 15 min, level 3 now, METs 2.2. She performs warm up and cool down unlimited, using red bands. Will progress to blue, 5.8 lbs. Will continue to monitor and progress as able.      Expected Outcomes i. Through exercise at rehab and home, the patient will decrease shortness of breath with daily activities and feel confident in carrying out an exercise regimen at home. i. Through exercise at rehab and home, the patient will decrease shortness of breath with daily activities and feel confident in carrying out an exercise regimen at home.         Discharge Exercise Prescription (Final Exercise Prescription Changes):  Exercise Prescription Changes - 05/12/24 1500       Response to Exercise   Blood Pressure (Admit) 118/64    Blood Pressure (Exercise) 130/80    Blood Pressure (Exit) 122/70    Heart Rate (Admit) 81 bpm    Heart Rate (Exercise) 110 bpm    Heart Rate (Exit) 91 bpm    Oxygen  Saturation (Admit) 97 %    Oxygen  Saturation (Exercise) 90 %    Oxygen  Saturation (Exit) 92 %    Rating of Perceived Exertion (Exercise) 13    Perceived Dyspnea (Exercise) 2    Duration Continue with 30 min of aerobic exercise without signs/symptoms of  physical distress.    Intensity THRR unchanged      Progression   Progression Continue to progress workloads to maintain intensity without signs/symptoms of physical distress.      Resistance Training   Training Prescription Yes    Weight red bands    Reps 10-15    Time 10 Minutes      Oxygen    Oxygen  Continuous    Liters 3      Treadmill   MPH 2    Grade 1    Minutes 15    METs 2.6      NuStep   Level 4    SPM 70    Minutes 15    METs 2.5      Oxygen    Maintain Oxygen  Saturation 88% or higher          Nutrition:  Target Goals: Understanding of nutrition guidelines, daily intake of sodium 1500mg , cholesterol 200mg , calories 30% from fat and 7% or less from saturated fats, daily to have 5 or more servings of fruits and vegetables.  Biometrics:  Pre Biometrics - 04/15/24 0919       Pre Biometrics   Grip Strength 10 kg           Nutrition Therapy Plan and Nutrition Goals:  Nutrition Therapy & Goals - 04/21/24 1040       Nutrition Therapy   Diet General Healthy Diet    Drug/Food Interactions Statins/Certain Fruits      Personal Nutrition Goals   Nutrition Goal Patient to continue diet quality by using the plate method as a guide for meal planning to include lean protein/plant protein, fruits, vegetables, whole grains, nonfat dairy as part of a well-balanced diet.    Comments Sharman has medical history of COPD. high cholesterol. A1c and lipids are well controlled. BMI is appropriate for age. Patient will benefit from participation in pulmonary rehab for nutrition education, exercise, and lifestyle modification.      Intervention Plan   Intervention Prescribe, educate and counsel regarding  individualized specific dietary modifications aiming towards targeted core components such as weight, hypertension, lipid management, diabetes, heart failure and other comorbidities.;Nutrition handout(s) given to patient.    Expected Outcomes Short Term Goal: Understand  basic principles of dietary content, such as calories, fat, sodium, cholesterol and nutrients.;Long Term Goal: Adherence to prescribed nutrition plan.          Nutrition Assessments:  Nutrition Assessments - 04/21/24 1207       Rate Your Plate Scores   Pre Score 58         MEDIFICTS Score Key: >=70 Need to make dietary changes  40-70 Heart Healthy Diet <= 40 Therapeutic Level Cholesterol Diet  Flowsheet Row PULMONARY REHAB OTHER RESPIRATORY from [facility]21/[facility] Heart, Vascular, & Lung Health  Picture Your Plate Total Score on Admission 58   Picture Your Plate Scores: <59 Unhealthy dietary pattern with much room for improvement. 41-50 Dietary pattern unlikely to meet recommendations for good health and room for improvement. 51-60 More healthful dietary pattern, with some room for improvement.  >60 Healthy dietary pattern, although there may be some specific behaviors that could be improved.    Nutrition Goals Re-Evaluation:  Nutrition Goals Re-Evaluation     Row Name 04/21/24 1040             Goals   Current Weight 175 lb 7.8 oz (79.6 kg)       Comment A1c WNL, triglyceride 202, LDL 93, HDL 70       Expected Outcome Sharman has medical history of COPD. high cholesterol. A1c and lipids are well controlled. She follows a regular diet. BMI is appropriate for age. Patient will benefit from participation in pulmonary rehab for nutrition education, exercise, and lifestyle modification.          Nutrition Goals Discharge (Final Nutrition Goals Re-Evaluation):  Nutrition Goals Re-Evaluation - 04/21/24 1040       Goals   Current Weight 175 lb 7.8 oz (79.6 kg)    Comment A1c WNL, triglyceride 202, LDL 93, HDL 70    Expected Outcome Sharman has medical history of COPD. high cholesterol. A1c and lipids are well controlled. She follows a regular diet. BMI is appropriate for age. Patient will benefit from participation in pulmonary rehab for  nutrition education, exercise, and lifestyle modification.          Psychosocial: Target Goals: Acknowledge presence or absence of significant depression and/or stress, maximize coping skills, provide positive support system. Participant is able to verbalize types and ability to use techniques and skills needed for reducing stress and depression.  Initial Review & Psychosocial Screening:  Initial Psych Review & Screening - 04/15/24 0933       Initial Review   Current issues with Current Psychotropic Meds;Current Stress Concerns    Source of Stress Concerns Chronic Illness    Comments Pt is worried and concerned about her 2 grandchildren that have been through alot in the past years. Losing their father and their only uncle and moving from [place] to [place]. Pt states she has to live long and be there for them and her daughter      Family Dynamics   Good Support System? Yes      Barriers   Psychosocial barriers to participate in program Psychosocial barriers identified (see note)      Screening Interventions   Interventions Encouraged to exercise;To provide support and resources with identified psychosocial needs;Provide feedback about the scores to participant  Expected Outcomes Short Term goal: Utilizing psychosocial counselor, staff and physician to assist with identification of specific Stressors or current issues interfering with healing process. Setting desired goal for each stressor or current issue identified.;Long Term Goal: Stressors or current issues are controlled or eliminated.;Short Term goal: Identification and review with participant of any Quality of Life or Depression concerns found by scoring the questionnaire.;Long Term goal: The participant improves quality of Life and PHQ9 Scores as seen by post scores and/or verbalization of changes          Quality of Life Scores:  Scores of 19 and below usually indicate a poorer quality of life in these areas.  A difference of  2-3  points is a clinically meaningful difference.  A difference of 2-3 points in the total score of the Quality of Life Index has been associated with significant improvement in overall quality of life, self-image, physical symptoms, and general health in studies assessing change in quality of life.  PHQ-9: Review Flowsheet  More data exists      04/21/2024 04/15/2024 05/14/2023 04/09/2023 02/07/2023  Depression screen PHQ 2/9  Decreased Interest 0 0 0 0 0  Down, Depressed, Hopeless 0 1 0 1 0  PHQ - 2 Score 0 1 0 1 0  Altered sleeping - 0 - - -  Tired, decreased energy - 0 - - -  Change in appetite - 0 - - -  Feeling bad or failure about yourself  - 0 - - -  Trouble concentrating - 1 - - -  Moving slowly or fidgety/restless - 0 - - -  Suicidal thoughts - 0 - - -  PHQ-9 Score - 2 - - -  Difficult doing work/chores - Not difficult at all - - -   Interpretation of Total Score  Total Score Depression Severity:  1-4 = Minimal depression, 5-9 = Mild depression, 10-14 = Moderate depression, 15-19 = Moderately severe depression, 20-27 = Severe depression   Psychosocial Evaluation and Intervention:  Psychosocial Evaluation - 04/15/24 0937       Psychosocial Evaluation & Interventions   Interventions Relaxation education;Encouraged to exercise with the program and follow exercise prescription    Comments Pt is worried and concerned about her 2 grandchildren that have been through alot in the past years. Losing their father and their only uncle and moving from [place] to [place]. Pt states she has to live long and be there for them and her daughter    Expected Outcomes For Sharman to participate in PR without any psy/soc barriers or concerns    Continue Psychosocial Services  Follow up required by staff          Psychosocial Re-Evaluation:  Psychosocial Re-Evaluation     Row Name 05/08/24 1405             Psychosocial Re-Evaluation   Current issues with Current Psychotropic Meds;Current Stress Concerns        Comments Monthly Core Components/Risk Factors/Patient Goals Review as follows: Sharman still worries about her 2 grandchildren after losing their father and uncle. She sees them weekly and assists her daughter out in caring for them during the week. She states they are not happy to be returning to school after being out for the summer. She declines any additional needs or resources at the time.       Expected Outcomes For Sharman to attend pulmonary rehab and to have a positive outlook and good coping skills to manage her stress. To ask for  resources or referrals if needed.       Interventions Stress management education;Encouraged to attend Pulmonary Rehabilitation for the exercise       Continue Psychosocial Services  No Follow up required          Psychosocial Discharge (Final Psychosocial Re-Evaluation):  Psychosocial Re-Evaluation - 05/08/24 1405       Psychosocial Re-Evaluation   Current issues with Current Psychotropic Meds;Current Stress Concerns    Comments Monthly Core Components/Risk Factors/Patient Goals Review as follows: Sharman still worries about her 2 grandchildren after losing their father and uncle. She sees them weekly and assists her daughter out in caring for them during the week. She states they are not happy to be returning to school after being out for the summer. She declines any additional needs or resources at the time.    Expected Outcomes For Sharman to attend pulmonary rehab and to have a positive outlook and good coping skills to manage her stress. To ask for resources or referrals if needed.    Interventions Stress management education;Encouraged to attend Pulmonary Rehabilitation for the exercise    Continue Psychosocial Services  No Follow up required          Education: Education Goals: Education classes will be provided on a weekly basis, covering required topics. Participant will state understanding/return demonstration of topics presented.  Learning  Barriers/Preferences:  Learning Barriers/Preferences - 04/15/24 9061       Learning Barriers/Preferences   Learning Barriers Sight    Learning Preferences None          Education Topics: Know Your Numbers Group instruction that is supported by a PowerPoint presentation. Instructor discusses importance of knowing and understanding resting, exercise, and post-exercise oxygen  saturation, heart rate, and blood pressure. Oxygen  saturation, heart rate, blood pressure, rating of perceived exertion, and dyspnea are reviewed along with a normal range for these values.    Exercise for the Pulmonary Patient Group instruction that is supported by a PowerPoint presentation. Instructor discusses benefits of exercise, core components of exercise, frequency, duration, and intensity of an exercise routine, importance of utilizing pulse oximetry during exercise, safety while exercising, and options of places to exercise outside of rehab.    MET Level  Group instruction provided by PowerPoint, verbal discussion, and written material to support subject matter. Instructor reviews what METs are and how to increase METs.    Pulmonary Medications Verbally interactive group education provided by instructor with focus on inhaled medications and proper administration.   Anatomy and Physiology of the Respiratory System Group instruction provided by PowerPoint, verbal discussion, and written material to support subject matter. Instructor reviews respiratory cycle and anatomical components of the respiratory system and their functions. Instructor also reviews differences in obstructive and restrictive respiratory diseases with examples of each.    Oxygen  Safety Group instruction provided by PowerPoint, verbal discussion, and written material to support subject matter. There is an overview of "What is Oxygen " and "Why do we need it".  Instructor also reviews how to create a safe environment for oxygen  use, the  importance of using oxygen  as prescribed, and the risks of noncompliance. There is a brief discussion on traveling with oxygen  and resources the patient may utilize.   Oxygen  Use Group instruction provided by PowerPoint, verbal discussion, and written material to discuss how supplemental oxygen  is prescribed and different types of oxygen  supply systems. Resources for more information are provided.    Breathing Techniques Group instruction that is supported by demonstration and informational  handouts. Instructor discusses the benefits of pursed lip and diaphragmatic breathing and detailed demonstration on how to perform both.  Flowsheet Row PULMONARY REHAB OTHER RESPIRATORY from 04/23/2024 in [facility] for Heart, Vascular, & Lung Health  Date 04/23/24  Educator RN  Instruction Review Code 1- Verbalizes Understanding     Risk Factor Reduction Group instruction that is supported by a PowerPoint presentation. Instructor discusses the definition of a risk factor, different risk factors for pulmonary disease, and how the heart and lungs work together.   Pulmonary Diseases Group instruction provided by PowerPoint, verbal discussion, and written material to support subject matter. Instructor gives an overview of the different type of pulmonary diseases. There is also a discussion on risk factors and symptoms as well as ways to manage the diseases.   Stress and Energy Conservation Group instruction provided by PowerPoint, verbal discussion, and written material to support subject matter. Instructor gives an overview of stress and the impact it can have on the body. Instructor also reviews ways to reduce stress. There is also a discussion on energy conservation and ways to conserve energy throughout the day. Flowsheet Row PULMONARY REHAB OTHER RESPIRATORY from 04/30/2024 in [facility] for Heart, Vascular, & Lung Health  Date 04/30/24  Educator RN   Instruction Review Code 1- Verbalizes Understanding    Warning Signs and Symptoms Group instruction provided by PowerPoint, verbal discussion, and written material to support subject matter. Instructor reviews warning signs and symptoms of stroke, heart attack, cold and flu. Instructor also reviews ways to prevent the spread of infection. Flowsheet Row PULMONARY REHAB OTHER RESPIRATORY from 05/07/2024 in [facility] for Heart, Vascular, & Lung Health  Date 05/07/24  Educator RN  Instruction Review Code 1- Verbalizes Understanding    Other Education Group or individual verbal, written, or video instructions that support the educational goals of the pulmonary rehab program.    Knowledge Questionnaire Score:  Knowledge Questionnaire Score - 04/15/24 1113       Knowledge Questionnaire Score   Pre Score 16/18          Core Components/Risk Factors/Patient Goals at Admission:  Personal Goals and Risk Factors at Admission - 04/15/24 0938       Core Components/Risk Factors/Patient Goals on Admission    Weight Management Yes;Weight Loss    Intervention Weight Management/Obesity: Establish reasonable short term and long term weight goals.;Weight Management: Provide education and appropriate resources to help participant work on and attain dietary goals.;Obesity: Provide education and appropriate resources to help participant work on and attain dietary goals.;Weight Management: Develop a combined nutrition and exercise program designed to reach desired caloric intake, while maintaining appropriate intake of nutrient and fiber, sodium and fats, and appropriate energy expenditure required for the weight goal.    Admit Weight 175 lb 7.8 oz (79.6 kg)    Expected Outcomes Short Term: Continue to assess and modify interventions until short term weight is achieved;Long Term: Adherence to nutrition and physical activity/exercise program aimed toward attainment of established  weight goal;Weight Loss: Understanding of general recommendations for a balanced deficit meal plan, which promotes 1-2 lb weight loss per week and includes a negative energy balance of [phone] kcal/d;Understanding recommendations for meals to include 15-35% energy as protein, 25-35% energy from fat, 35-60% energy from carbohydrates, less than 200mg  of dietary cholesterol, 20-35 gm of total fiber daily;Understanding of distribution of calorie intake throughout the day with the consumption of 4-5 meals/snacks  Improve shortness of breath with ADL's Yes    Intervention Provide education, individualized exercise plan and daily activity instruction to help decrease symptoms of SOB with activities of daily living.    Expected Outcomes Short Term: Improve cardiorespiratory fitness to achieve a reduction of symptoms when performing ADLs;Long Term: Be able to perform more ADLs without symptoms or delay the onset of symptoms          Core Components/Risk Factors/Patient Goals Review:   Goals and Risk Factor Review     Row Name 05/08/24 1408             Core Components/Risk Factors/Patient Goals Review   Personal Goals Review Weight Management/Obesity;Improve shortness of breath with ADL's;Develop more efficient breathing techniques such as purse lipped breathing and diaphragmatic breathing and practicing self-pacing with activity.       Review Monthly review of patient's Core Components/Risk Factors/Patient Goals are as follows: Goal in progress for improving her shortness of breath with ADLs. Sharman had to be started on oxygen  to maintain her saturations. She recently needed to increase to 3L with exertion. She has been exercising on the treadmill and NuStep. She has completed 7 sessions and is making progress. Goal met on developing more efficient breathing techniques such as purse lipped breathing and diaphragmatic breathing; and practicing self-pacing with activity. Sharman can initiate pursed lip  breathing and is practicing diaphragmatic breathing before bed. She can self-pace when needed and allows herself to take breaks while walking or exercising. Goal progressing on weight loss. Sharman has been working with our dietician on ways to incorporate the plate method and choosing a wide variety of foods. She is working on decreasing her caloric intake as well as eating out less. She is down ~1.8#. We will continue to monitor [name]'s progress throughout the program.       Expected Outcomes Pt will show progress toward meeting expected goals and outcomes.          Core Components/Risk Factors/Patient Goals at Discharge (Final Review):   Goals and Risk Factor Review - 05/08/24 1408       Core Components/Risk Factors/Patient Goals Review   Personal Goals Review Weight Management/Obesity;Improve shortness of breath with ADL's;Develop more efficient breathing techniques such as purse lipped breathing and diaphragmatic breathing and practicing self-pacing with activity.    Review Monthly review of patient's Core Components/Risk Factors/Patient Goals are as follows: Goal in progress for improving her shortness of breath with ADLs. Sharman had to be started on oxygen  to maintain her saturations. She recently needed to increase to 3L with exertion. She has been exercising on the treadmill and NuStep. She has completed 7 sessions and is making progress. Goal met on developing more efficient breathing techniques such as purse lipped breathing and diaphragmatic breathing; and practicing self-pacing with activity. Sharman can initiate pursed lip breathing and is practicing diaphragmatic breathing before bed. She can self-pace when needed and allows herself to take breaks while walking or exercising. Goal progressing on weight loss. Sharman has been working with our dietician on ways to incorporate the plate method and choosing a wide variety of foods. She is working on decreasing her caloric intake as well as eating out  less. She is down ~1.8#. We will continue to monitor [name]'s progress throughout the program.    Expected Outcomes Pt will show progress toward meeting expected goals and outcomes.          ITP Comments: Pt is making expected progress toward Pulmonary Rehab goals after  completing 7 session(s). Recommend continued exercise, life style modification, education, and utilization of breathing techniques to increase stamina and strength, while also decreasing shortness of breath with exertion.     Comments: Dr. Slater Staff is Medical Director for Pulmonary Rehab at [facility].

## 2024-05-14 ENCOUNTER — Encounter (HOSPITAL_COMMUNITY)
Admission: RE | Admit: 2024-05-14 | Discharge: 2024-05-14 | Disposition: A | Discharge status: Home / Self Care | Source: Ambulatory Visit | Attending: Pulmonary Disease | Admitting: Pulmonary Disease

## 2024-05-14 DIAGNOSIS — J9611 Chronic respiratory failure with hypoxia: Secondary | ICD-10-CM | POA: Diagnosis not present

## 2024-05-14 DIAGNOSIS — J432 Centrilobular emphysema: Secondary | ICD-10-CM

## 2024-05-14 NOTE — Progress Notes (Signed)
 Daily Session Note  Patient Details  Name: Christine Barry MRN: [id number] Date of Birth: [birth year] Referring Provider:   [name] Pulmonary Rehab Walk Test from 04/15/2024 in [facility] for Heart, Vascular, & Lung Health  Referring Provider McQuaid    Encounter Date: 05/14/2024  Check In:  Session Check In - 05/14/24 1500       Check-In   Supervising physician immediately available to respond to emergencies [facility] MD immediately available    Physician(s) Orren Fabry, PA    Location [facility]    Staff Present Ronal Levin, RN, Maud Moats, MS, ACSM-CEP, Exercise Physiologist;Casey Claudene, RT;Randi Midge BS, ACSM-CEP, Exercise Physiologist;Bailey Elnor, MS, Exercise Physiologist    Virtual Visit No    Medication changes reported     No    Fall or balance concerns reported    No    Tobacco Cessation No Change    Warm-up and Cool-down Performed as group-led instruction    Resistance Training Performed Yes    VAD Patient? No    PAD/SET Patient? No      Pain Assessment   Currently in Pain? No/denies    Multiple Pain Sites No          Capillary Blood Glucose: No results found for this or any previous visit (from the past 24 hours).    Social History   Tobacco Use  Smoking Status Former   Current packs/day: 0.00   Average packs/day: 2.0 packs/day for 38.7 years (77.5 ttl pk-yrs)   Types: Cigarettes   Start date: 10/08/1961   Quit date: 07/07/2000   Years since quitting: 23.8  Smokeless Tobacco Never    Goals Met:  Proper associated with RPD/PD & O2 Sat Exercise tolerated well No report of concerns or symptoms today Strength training completed today  Goals Unmet:  Not Applicable  Comments: Service time is from 1308 to 1444.    Dr. Slater Staff is Medical Director for Pulmonary Rehab at [facility].

## 2024-05-15 DIAGNOSIS — J449 Chronic obstructive pulmonary disease, unspecified: Secondary | ICD-10-CM | POA: Diagnosis not present

## 2024-05-15 DIAGNOSIS — J9611 Chronic respiratory failure with hypoxia: Secondary | ICD-10-CM | POA: Diagnosis not present

## 2024-05-19 ENCOUNTER — Encounter (HOSPITAL_COMMUNITY)
Admission: RE | Admit: 2024-05-19 | Discharge: 2024-05-19 | Disposition: A | Discharge status: Home / Self Care | Source: Ambulatory Visit | Attending: Pulmonary Disease | Admitting: Pulmonary Disease

## 2024-05-19 DIAGNOSIS — J432 Centrilobular emphysema: Secondary | ICD-10-CM

## 2024-05-19 DIAGNOSIS — J9611 Chronic respiratory failure with hypoxia: Secondary | ICD-10-CM

## 2024-05-19 NOTE — Progress Notes (Signed)
 Daily Session Note  Patient Details  Name: Christine Barry MRN: [id number] Date of Birth: Oct 31, 1940 Referring Provider:   [name] Pulmonary Rehab Walk Test from 04/15/2024 in [facility] for Heart, Vascular, & Lung Health  Referring Provider McQuaid    Encounter Date: 05/19/2024  Check In:  Session Check In - 05/19/24 1451       Check-In   Supervising physician immediately available to respond to emergencies [facility] MD immediately available    Physician(s) Rosabel Mose, NP    Location [facility]    Staff Present Johnnie Moats, MS, ACSM-CEP, Exercise Physiologist;Casey Claudene Evert Byes, RN, MHA;Carlette Bernett, RN, BSN    Virtual Visit No    Medication changes reported     No    Fall or balance concerns reported    No    Tobacco Cessation No Change    Warm-up and Cool-down Performed as group-led instruction    Resistance Training Performed Yes    VAD Patient? No    PAD/SET Patient? No      Pain Assessment   Currently in Pain? No/denies    Multiple Pain Sites No          Capillary Blood Glucose: No results found for this or any previous visit (from the past 24 hours).    Social History   Tobacco Use  Smoking Status Former   Current packs/day: 0.00   Average packs/day: 2.0 packs/day for 38.7 years (77.5 ttl pk-yrs)   Types: Cigarettes   Start date: 10/08/1961   Quit date: 07/07/2000   Years since quitting: 23.8  Smokeless Tobacco Never    Goals Met:  Proper associated with RPD/PD & O2 Sat Exercise tolerated well No report of concerns or symptoms today Strength training completed today  Goals Unmet:  Not Applicable  Comments: Service time is from 1316 to 1440.    Dr. Slater Staff is Medical Director for Pulmonary Rehab at [facility].

## 2024-05-21 ENCOUNTER — Encounter (HOSPITAL_COMMUNITY)
Admission: RE | Admit: 2024-05-21 | Discharge: 2024-05-21 | Disposition: A | Discharge status: Home / Self Care | Source: Ambulatory Visit | Attending: Pulmonary Disease | Admitting: Pulmonary Disease

## 2024-05-21 DIAGNOSIS — J9611 Chronic respiratory failure with hypoxia: Secondary | ICD-10-CM

## 2024-05-21 DIAGNOSIS — J432 Centrilobular emphysema: Secondary | ICD-10-CM

## 2024-05-21 DIAGNOSIS — J449 Chronic obstructive pulmonary disease, unspecified: Secondary | ICD-10-CM | POA: Diagnosis not present

## 2024-05-21 NOTE — Progress Notes (Signed)
 Daily Session Note  Patient Details  Name: Christine Barry MRN: [id number] Date of Birth: 31-May-1941 Referring Provider:   [name] Pulmonary Rehab Walk Test from 04/15/2024 in [facility] for Heart, Vascular, & Lung Health  Referring Provider McQuaid    Encounter Date: 05/21/2024  Check In:  Session Check In - 05/21/24 1323       Check-In   Supervising physician immediately available to respond to emergencies [facility] MD immediately available    Physician(s) Damien Braver, NP    Location [facility]    Staff Present Johnnie Moats, MS, ACSM-CEP, Exercise Physiologist;Casey Claudene Candia Levin, RN, BSN;Johnny Porter, MS, Exercise Physiologist    Virtual Visit No    Medication changes reported     No    Fall or balance concerns reported    No    Tobacco Cessation No Change    Warm-up and Cool-down Performed as group-led instruction    Resistance Training Performed Yes    VAD Patient? No    PAD/SET Patient? No      Pain Assessment   Currently in Pain? No/denies    Multiple Pain Sites No          Capillary Blood Glucose: No results found for this or any previous visit (from the past 24 hours).    Social History   Tobacco Use  Smoking Status Former   Current packs/day: 0.00   Average packs/day: 2.0 packs/day for 38.7 years (77.5 ttl pk-yrs)   Types: Cigarettes   Start date: 10/08/1961   Quit date: 07/07/2000   Years since quitting: 23.8  Smokeless Tobacco Never    Goals Met:  Exercise tolerated well No report of concerns or symptoms today Strength training completed today  Goals Unmet:  Not Applicable  Comments: Service time is from 1316 to 1445    Dr. Slater Staff is Medical Director for Pulmonary Rehab at [facility].

## 2024-05-26 ENCOUNTER — Encounter (HOSPITAL_COMMUNITY)
Admission: RE | Admit: 2024-05-26 | Discharge: 2024-05-26 | Disposition: A | Discharge status: Home / Self Care | Source: Ambulatory Visit | Attending: Pulmonary Disease | Admitting: Pulmonary Disease

## 2024-05-26 VITALS — Wt 172.6 lb

## 2024-05-26 DIAGNOSIS — J9611 Chronic respiratory failure with hypoxia: Secondary | ICD-10-CM

## 2024-05-26 DIAGNOSIS — J432 Centrilobular emphysema: Secondary | ICD-10-CM

## 2024-05-26 NOTE — Progress Notes (Signed)
 Daily Session Note  Patient Details  Name: Christine Barry MRN: [id number] Date of Birth: May 31, 1941 Referring Provider:   [name] Pulmonary Rehab Walk Test from 04/15/2024 in [facility] for Heart, Vascular, & Lung Health  Referring Provider McQuaid    Encounter Date: 05/26/2024  Check In:  Session Check In - 05/26/24 1528       Check-In   Supervising physician immediately available to respond to emergencies [facility] MD immediately available    Physician(s) Lum Louis, NP    [facility]    Staff Present Johnnie Moats, MS, ACSM-CEP, Exercise Physiologist;Markea Ruzich Claudene Candia Levin, RN, Valere Music, RN, BSN    Virtual Visit No    Medication changes reported     No    Fall or balance concerns reported    No    Tobacco Cessation No Change    Warm-up and Cool-down Performed as group-led instruction    Resistance Training Performed Yes    VAD Patient? No    PAD/SET Patient? No      Pain Assessment   Currently in Pain? No/denies    Multiple Pain Sites No          Capillary Blood Glucose: No results found for this or any previous visit (from the past 24 hours).   Exercise Prescription Changes - 05/26/24 1500       Response to Exercise   Blood Pressure (Admit) 118/70    Blood Pressure (Exercise) 142/74    Blood Pressure (Exit) 98/70    Heart Rate (Admit) 78 bpm    Heart Rate (Exercise) 116 bpm    Heart Rate (Exit) 92 bpm    Oxygen  Saturation (Admit) 97 %    Oxygen  Saturation (Exercise) 90 %    Oxygen  Saturation (Exit) 95 %    Rating of Perceived Exertion (Exercise) 12    Perceived Dyspnea (Exercise) 3    Duration Continue with 30 min of aerobic exercise without signs/symptoms of physical distress.    Intensity THRR unchanged      Progression   Progression Continue to progress workloads to maintain intensity without signs/symptoms of physical distress.      Resistance Training   Training  Prescription Yes    Weight red bands    Reps 10-15    Time 10 Minutes      Oxygen    Oxygen  Continuous    Liters 3-4      Treadmill   MPH 1.9    Grade 1.5    Minutes 15    METs 2.85      NuStep   Level 5    SPM 70    Minutes 15    METs 2.8      Oxygen    Maintain Oxygen  Saturation 88% or higher          Social History   Tobacco Use  Smoking Status Former   Current packs/day: 0.00   Average packs/day: 2.0 packs/day for 38.7 years (77.5 ttl pk-yrs)   Types: Cigarettes   Start date: 10/08/1961   Quit date: 07/07/2000   Years since quitting: 23.9  Smokeless Tobacco Never    Goals Met:  Proper associated with RPD/PD & O2 Sat Independence with exercise equipment Exercise tolerated well No report of concerns or symptoms today Strength training completed today  Goals Unmet:  Not Applicable  Comments: Service time is from 1324 to 1440.    Dr. Slater Staff is Medical Director for Pulmonary Rehab at [facility]  [facility].

## 2024-05-28 ENCOUNTER — Encounter (HOSPITAL_COMMUNITY)
Admission: RE | Admit: 2024-05-28 | Discharge: 2024-05-28 | Disposition: A | Discharge status: Home / Self Care | Source: Ambulatory Visit | Attending: Pulmonary Disease | Admitting: Pulmonary Disease

## 2024-05-28 DIAGNOSIS — J9611 Chronic respiratory failure with hypoxia: Secondary | ICD-10-CM | POA: Diagnosis not present

## 2024-05-28 DIAGNOSIS — J432 Centrilobular emphysema: Secondary | ICD-10-CM

## 2024-05-28 NOTE — Progress Notes (Signed)
 Daily Session Note  Patient Details  Name: Christine Barry MRN: [id number] Date of Birth: [birth year] Referring Provider:   [name] Pulmonary Rehab Walk Test from 04/15/2024 in [facility]  Referring Provider McQuaid    Encounter Date: 05/28/2024  Check In:  Session Check In - 05/28/24 1336       Check-In   Supervising physician immediately available to respond to emergencies [facility] MD immediately available    Physician(s) Rosaline Bane, NP    Location [facility]    Staff Present Johnnie Moats, MS, ACSM-CEP, Exercise Physiologist;Casey Claudene Candia Levin, RN, BSN;Anabell Swint BS, ACSM-CEP, Exercise Physiologist    Virtual Visit No    Medication changes reported     No    Fall or balance concerns reported    No    Tobacco Cessation No Change    Warm-up and Cool-down Performed as group-led instruction    Resistance Training Performed Yes    VAD Patient? No    PAD/SET Patient? No      Pain Assessment   Currently in Pain? No/denies    Multiple Pain Sites No          Capillary Blood Glucose: No results found for this or any previous visit (from the past 24 hours).    Social History   Tobacco Use  Smoking Status Former   Current packs/day: 0.00   Average packs/day: 2.0 packs/day for 38.7 years (77.5 ttl pk-yrs)   Types: Cigarettes   Start date: 10/08/1961   Quit date: 07/07/2000   Years since quitting: 23.9  Smokeless Tobacco Never    Goals Met:  Independence with exercise equipment Exercise tolerated well No report of concerns or symptoms today Strength training completed today  Goals Unmet:  Not Applicable  Comments: Service time is from 1311 to 1450.    Dr. Slater Staff is Medical Director for Pulmonary Rehab at [facility].

## 2024-05-29 ENCOUNTER — Other Ambulatory Visit: Payer: Self-pay | Admitting: Medical-Surgical

## 2024-05-29 DIAGNOSIS — E7800 Pure hypercholesterolemia, unspecified: Secondary | ICD-10-CM

## 2024-06-02 ENCOUNTER — Encounter (HOSPITAL_COMMUNITY)
Admission: RE | Admit: 2024-06-02 | Discharge: 2024-06-02 | Disposition: A | Discharge status: Home / Self Care | Source: Ambulatory Visit | Attending: Pulmonary Disease | Admitting: Pulmonary Disease

## 2024-06-02 DIAGNOSIS — J432 Centrilobular emphysema: Secondary | ICD-10-CM

## 2024-06-02 DIAGNOSIS — J9611 Chronic respiratory failure with hypoxia: Secondary | ICD-10-CM

## 2024-06-02 NOTE — Progress Notes (Signed)
 Daily Session Note  Patient Details  Name: Christine Barry MRN: [id number] Date of Birth: 1940/12/02 Referring Provider:   [name] Pulmonary Rehab Walk Test from 04/15/2024 in [facility]  Referring Provider McQuaid    Encounter Date: 06/02/2024  Check In:  Session Check In - 06/02/24 1344       Check-In   Supervising physician immediately available to respond to emergencies [facility] MD immediately available    Physician(s) Rosaline Bane, NP    [facility]    Staff Present Johnnie Moats, MS, ACSM-CEP, Exercise Physiologist;Casey Claudene Candia Levin, RN, BSN;Randi Reeve BS, ACSM-CEP, Exercise Physiologist    Virtual Visit No    Medication changes reported     No    Fall or balance concerns reported    No    Tobacco Cessation No Change    Warm-up and Cool-down Performed as group-led instruction    Resistance Training Performed Yes    VAD Patient? No    PAD/SET Patient? No      Pain Assessment   Currently in Pain? No/denies    Multiple Pain Sites No          Capillary Blood Glucose: No results found for this or any previous visit (from the past 24 hours).    Social History   Tobacco Use  Smoking Status Former   Current packs/day: 0.00   Average packs/day: 2.0 packs/day for 38.7 years (77.5 ttl pk-yrs)   Types: Cigarettes   Start date: 10/08/1961   Quit date: 07/07/2000   Years since quitting: 23.9  Smokeless Tobacco Never    Goals Met:  Independence with exercise equipment Exercise tolerated well No report of concerns or symptoms today Strength training completed today  Goals Unmet:  Not Applicable  Comments: Service time is from 1318 to 1440    Dr. Slater Staff is Medical Director for Pulmonary Rehab at [facility].

## 2024-06-03 NOTE — Addendum Note (Signed)
 Encounter addended by: Carly Sabo, RN on: 06/03/2024 5:00 PM  Actions taken: Flowsheet accepted

## 2024-06-04 ENCOUNTER — Encounter (HOSPITAL_COMMUNITY)
Admission: RE | Admit: 2024-06-04 | Discharge: 2024-06-04 | Disposition: A | Discharge status: Home / Self Care | Source: Ambulatory Visit | Attending: Pulmonary Disease

## 2024-06-04 DIAGNOSIS — J9611 Chronic respiratory failure with hypoxia: Secondary | ICD-10-CM

## 2024-06-04 DIAGNOSIS — J432 Centrilobular emphysema: Secondary | ICD-10-CM

## 2024-06-04 NOTE — Progress Notes (Signed)
 Daily Session Note  Patient Details  Name: Christine Barry MRN: [id number] Date of Birth: 1941-08-30 Referring Provider:   [name] Pulmonary Rehab Walk Test from 04/15/2024 in [facility]  Referring Provider McQuaid    Encounter Date: 06/04/2024  Check In:  Session Check In - 06/04/24 1340       Check-In   Supervising physician immediately available to respond to emergencies [facility] MD immediately available    Physician(s) Jackee Alberts, NP    Location [facility]    Staff Present Johnnie Moats, MS, ACSM-CEP, Exercise Physiologist;Casey Claudene Candia Levin, RN, BSN;Randi Reeve BS, ACSM-CEP, Exercise Physiologist    Virtual Visit No    Medication changes reported     No    Fall or balance concerns reported    No    Tobacco Cessation No Change    Warm-up and Cool-down Performed as group-led instruction    Resistance Training Performed Yes    VAD Patient? No    PAD/SET Patient? No      Pain Assessment   Currently in Pain? No/denies    Multiple Pain Sites No          Capillary Blood Glucose: No results found for this or any previous visit (from the past 24 hours).    Social History   Tobacco Use  Smoking Status Former   Current packs/day: 0.00   Average packs/day: 2.0 packs/day for 38.7 years (77.5 ttl pk-yrs)   Types: Cigarettes   Start date: 10/08/1961   Quit date: 07/07/2000   Years since quitting: 23.9  Smokeless Tobacco Never    Goals Met:  Proper associated with RPD/PD & O2 Sat Exercise tolerated well No report of concerns or symptoms today Strength training completed today  Goals Unmet:  Not Applicable  Comments: Service time is from 1314 to 1449.    Dr. Slater Staff is Medical Director for Pulmonary Rehab at [facility].

## 2024-06-09 ENCOUNTER — Encounter: Payer: Self-pay | Admitting: Sports Medicine

## 2024-06-09 ENCOUNTER — Encounter (HOSPITAL_COMMUNITY)
Admission: RE | Admit: 2024-06-09 | Discharge: 2024-06-09 | Disposition: A | Discharge status: Home / Self Care | Source: Ambulatory Visit | Attending: Pulmonary Disease | Admitting: Pulmonary Disease

## 2024-06-09 VITALS — Wt 173.9 lb

## 2024-06-09 DIAGNOSIS — J9611 Chronic respiratory failure with hypoxia: Secondary | ICD-10-CM | POA: Insufficient documentation

## 2024-06-09 DIAGNOSIS — J432 Centrilobular emphysema: Secondary | ICD-10-CM | POA: Insufficient documentation

## 2024-06-09 NOTE — Progress Notes (Signed)
 Daily Session Note  Patient Details  Name: Christine Barry MRN: [id number] Date of Birth: 1941/08/04 Referring Provider:   [name] Pulmonary Rehab Walk Test from 04/15/2024 in [facility]  Referring Provider McQuaid    Encounter Date: 06/09/2024  Check In:  Session Check In - 06/09/24 1322       Check-In   Supervising physician immediately available to respond to emergencies [facility] MD immediately available    Physician(s) Josefa Beauvais, NP    [facility]    Staff Present Johnnie Moats, MS, ACSM-CEP, Exercise Physiologist;Casey Claudene Candia Levin, RN, BSN;Randi Reeve BS, ACSM-CEP, Exercise Physiologist    Virtual Visit No    Medication changes reported     No    Fall or balance concerns reported    No    Tobacco Cessation No Change    Warm-up and Cool-down Performed as group-led instruction    Resistance Training Performed Yes    VAD Patient? No    PAD/SET Patient? No      Pain Assessment   Currently in Pain? No/denies    Multiple Pain Sites No          Capillary Blood Glucose: No results found for this or any previous visit (from the past 24 hours).   Exercise Prescription Changes - 06/09/24 1500       Response to Exercise   Blood Pressure (Admit) 130/68    Blood Pressure (Exercise) 138/70    Blood Pressure (Exit) 132/68    Heart Rate (Admit) 88 bpm    Heart Rate (Exercise) 120 bpm    Heart Rate (Exit) 92 bpm    Oxygen  Saturation (Admit) 88 %   RA   Oxygen  Saturation (Exercise) 89 %   3L   Oxygen  Saturation (Exit) 92 %   RA   Rating of Perceived Exertion (Exercise) 14    Perceived Dyspnea (Exercise) 2    Duration Continue with 30 min of aerobic exercise without signs/symptoms of physical distress.    Intensity THRR unchanged      Progression   Progression Continue to progress workloads to maintain intensity without signs/symptoms of physical distress.       Resistance Training   Training Prescription Yes    Weight red bands    Reps 10-15    Time 10 Minutes      Oxygen    Oxygen  Continuous    Liters 2-3      Treadmill   MPH 1.9    Grade 1.5    Minutes 15    METs 2.5      NuStep   Level 5    SPM 72    Minutes 15    METs 3      Oxygen    Maintain Oxygen  Saturation 88% or higher          Social History   Tobacco Use  Smoking Status Former   Current packs/day: 0.00   Average packs/day: 2.0 packs/day for 38.7 years (77.5 ttl pk-yrs)   Types: Cigarettes   Start date: 10/08/1961   Quit date: 07/07/2000   Years since quitting: 23.9  Smokeless Tobacco Never    Goals Met:  Independence with exercise equipment Exercise tolerated well No report of concerns or symptoms today Strength training completed today  Goals Unmet:  Not Applicable  Comments: Service time is from 1309 to 1435    Dr. Slater Staff is Medical Director for Pulmonary Rehab at  [facility].

## 2024-06-10 NOTE — Progress Notes (Signed)
 Pulmonary Individual Treatment Plan  Patient Details  Name: Christine Barry MRN: [id number] Date of Birth: Apr 25, 1941 Referring Provider:   [name] Pulmonary Rehab Walk Test from 04/15/2024 in [facility]  Referring Provider McQuaid    Initial Encounter Date:  Flowsheet Row Pulmonary Rehab Walk Test from 04/15/2024 in [facility]  Date 04/15/24    Visit Diagnosis: Chronic respiratory failure with hypoxia (HCC)  Centrilobular emphysema (HCC)  Patient's Home Medications on Admission:   Current Outpatient Medications:    albuterol  (VENTOLIN  HFA) 108 (90 Base) MCG/ACT inhaler, Inhale 2 puffs into the lungs every 6 (six) hours as needed for wheezing or shortness of breath., Disp: 8 g, Rfl: 1   ALPRAZolam  (XANAX ) 0.25 MG tablet, Take 1 tablet (0.25 mg total) by mouth at bedtime as needed for anxiety., Disp: 15 tablet, Rfl: 0   azithromycin  (ZITHROMAX ) 250 MG tablet, Take 250 mg by mouth daily., Disp: , Rfl:    cetirizine (ZYRTEC) 10 MG tablet, Take 10 mg by mouth daily as needed., Disp: , Rfl:    Cholecalciferol 50 MCG (2000 UT) TABS, Take 2,000 Units by mouth daily., Disp: , Rfl:    cyanocobalamin  1000 MCG tablet, Take 1,000 mcg by mouth 3 (three) times a week., Disp: , Rfl:    Ferrous Sulfate (IRON PO), Take by mouth. Nature made, Disp: , Rfl:    finasteride (PROSCAR) 5 MG tablet, Take 5 mg by mouth daily., Disp: , Rfl:    furosemide  (LASIX ) 20 MG tablet, Take 20 mg by mouth., Disp: , Rfl:    ipratropium (ATROVENT ) 0.03 % nasal spray, Place 1 spray into the nose 4 (four) times daily as needed., Disp: , Rfl:    levothyroxine  (SYNTHROID ) 200 MCG tablet, Take 1 tablet (200 mcg total) by mouth daily before breakfast. BRAND NAME ONLY, Disp: 90 tablet, Rfl: 1   meloxicam  (MOBIC ) 15 MG tablet, Take 1 tablet (15 mg total) by mouth daily., Disp: 90 tablet, Rfl: 3    minoxidil (LONITEN) 2.5 MG tablet, Take 1.25 mg by mouth daily., Disp: , Rfl:    montelukast  (SINGULAIR ) 10 MG tablet, TAKE 1 TABLET BY MOUTH AT BEDTIME, Disp: 90 tablet, Rfl: 1   simvastatin  (ZOCOR ) 40 MG tablet, TAKE 1 TABLET BY MOUTH ONCE DAILY AT BEDTIME, Disp: 30 tablet, Rfl: 2   TRELEGY ELLIPTA  200-62.5-25 MCG/ACT AEPB, Inhale 1 puff into the lungs., Disp: , Rfl:   Past Medical History: Past Medical History:  Diagnosis Date   Allergy 1964   Anxiety 1986   Arthritis    Asthma    COPD (chronic obstructive pulmonary disease) (HCC)    Emphysema of lung (HCC) 2016   Hyperlipidemia    Thyroid  disease     Tobacco Use: Social History   Tobacco Use  Smoking Status Former   Current packs/day: 0.00   Average packs/day: 2.0 packs/day for 38.7 years (77.5 ttl pk-yrs)   Types: Cigarettes   Start date: 10/08/1961   Quit date: 07/07/2000   Years since quitting: 23.9  Smokeless Tobacco Never    Labs: Review Flowsheet  More data may exist      Latest Ref Rng & Units 06/08/2021 07/20/2022 02/07/2023 07/16/2023 02/10/2024  Labs for ITP Cardiac and Pulmonary Rehab  Cholestrol <200 mg/dL 793  766  807  - -  LDL (calc) mg/dL (calc) 893  876  93  - -  HDL-C > OR =  50 mg/dL 72  82  70  - -  Trlycerides <150 mg/dL 831  839  797  - -  Hemoglobin A1c 4.0 - 5.6 % - - 6.0  6.0  5.5     Capillary Blood Glucose: No results found for: GLUCAP   Pulmonary Assessment Scores:  Pulmonary Assessment Scores     Row Name 04/15/24 0952         ADL UCSD   ADL Phase Entry     SOB Score total 34       CAT Score   CAT Score 15       mMRC Score   mMRC Score 3       UCSD: Self-administered rating of dyspnea associated with activities of daily living (ADLs) 6-point scale (0 = not at all to 5 = maximal or unable to do because of breathlessness)  Scoring Scores range from 0 to 120.  Minimally important difference is 5 units  CAT: CAT can identify the health impairment of COPD patients and is  better correlated with disease progression.  CAT has a scoring range of zero to 40. The CAT score is classified into four groups of low (less than 10), medium (10 - 20), high (21-30) and very high (31-40) based on the impact level of disease on health status. A CAT score over 10 suggests significant symptoms.  A worsening CAT score could be explained by an exacerbation, poor medication adherence, poor inhaler technique, or progression of COPD or comorbid conditions.  CAT MCID is 2 points  mMRC: mMRC (Modified Medical Research Council) Dyspnea Scale is used to assess the degree of baseline functional disability in patients of respiratory disease due to dyspnea. No minimal important difference is established. A decrease in score of 1 point or greater is considered a positive change.   Pulmonary Function Assessment:  Pulmonary Function Assessment - 04/15/24 0955       Breath   Bilateral Breath Sounds Clear    Shortness of Breath Yes;Limiting activity          Exercise Target Goals: Exercise Program Goal: Individual exercise prescription set using results from initial 6 min walk test and THRR while considering  patient's activity barriers and safety.   Exercise Prescription Goal: Initial exercise prescription builds to 30-45 minutes a day of aerobic activity, 2-3 days per week.  Home exercise guidelines will be given to patient during program as part of exercise prescription that the participant will acknowledge.  Activity Barriers & Risk Stratification:  Activity Barriers & Cardiac Risk Stratification - 04/15/24 1104       Activity Barriers & Cardiac Risk Stratification   Activity Barriers Arthritis;Deconditioning;Muscular Weakness;Shortness of Breath          6 Minute Walk:  6 Minute Walk     Row Name 04/15/24 1056         6 Minute Walk   Phase Initial     Distance 985 feet     Walk Time 6 minutes     # of Rest Breaks 1  2:40-2:50     MPH 1.87     METS 1.93     RPE  11     Perceived Dyspnea  1     VO2 Peak 6.75     Symptoms No     Resting HR 89 bpm     Resting BP 124/58     Resting Oxygen  Saturation  92 %     Exercise Oxygen  Saturation  during 6  min walk 87 %     Max Ex. HR 105 bpm     Max Ex. BP 160/80     2 Minute Post BP 120/80       Interval HR   1 Minute HR 90     2 Minute HR 101     3 Minute HR 90     4 Minute HR 93     5 Minute HR 100     6 Minute HR 105     2 Minute Post HR 86     Interval Heart Rate? Yes       Interval Oxygen    Interval Oxygen ? Yes     Baseline Oxygen  Saturation % 92 %     1 Minute Oxygen  Saturation % 93 %     1 Minute Liters of Oxygen  0 L     2 Minute Oxygen  Saturation % 87 %     2 Minute Liters of Oxygen  0 L  increased to 1L     3 Minute Oxygen  Saturation % 89 %  87% at 2:40     3 Minute Liters of Oxygen  1 L  increased to 2L     4 Minute Oxygen  Saturation % 94 %     4 Minute Liters of Oxygen  2 L     5 Minute Oxygen  Saturation % 93 %     5 Minute Liters of Oxygen  2 L     6 Minute Oxygen  Saturation % 90 %     6 Minute Liters of Oxygen  2 L     2 Minute Post Oxygen  Saturation % 95 %     2 Minute Post Liters of Oxygen  2 L        Oxygen  Initial Assessment:  Oxygen  Initial Assessment - 04/15/24 0954       Home Oxygen    Home Oxygen  Device None    Sleep Oxygen  Prescription None    Home Exercise Oxygen  Prescription None    Home Resting Oxygen  Prescription None      Initial 6 min Walk   Oxygen  Used Continuous    Liters per minute 2      Program Oxygen  Prescription   Program Oxygen  Prescription Continuous    Liters per minute 2      Intervention   Short Term Goals To learn and exhibit compliance with exercise, home and travel O2 prescription;To learn and understand importance of maintaining oxygen  saturations>88%;To learn and demonstrate proper use of respiratory medications;To learn and understand importance of monitoring SPO2 with pulse oximeter and demonstrate accurate use of the pulse oximeter.;To  learn and demonstrate proper pursed lip breathing techniques or other breathing techniques.     Long  Term Goals Exhibits compliance with exercise, home  and travel O2 prescription;Verbalizes importance of monitoring SPO2 with pulse oximeter and return demonstration;Maintenance of O2 saturations>88%;Exhibits proper breathing techniques, such as pursed lip breathing or other method taught during program session;Compliance with respiratory medication;Demonstrates proper use of MDI's          Oxygen  Re-Evaluation:  Oxygen  Re-Evaluation     Row Name 04/15/24 0954 05/07/24 0856 06/02/24 0846         Program Oxygen  Prescription   Program Oxygen  Prescription -- Continuous Continuous     Liters per minute -- 3 3     Comments -- consistently using 3L for exercise has had one day that she needed 4L       Home Oxygen    Home Oxygen  Device -- None  None     Sleep Oxygen  Prescription -- None None     Home Exercise Oxygen  Prescription -- None None     Home Resting Oxygen  Prescription -- None None       Goals/Expected Outcomes   Short Term Goals -- To learn and exhibit compliance with exercise, home and travel O2 prescription;To learn and understand importance of maintaining oxygen  saturations>88%;To learn and demonstrate proper use of respiratory medications;To learn and understand importance of monitoring SPO2 with pulse oximeter and demonstrate accurate use of the pulse oximeter.;To learn and demonstrate proper pursed lip breathing techniques or other breathing techniques.  To learn and exhibit compliance with exercise, home and travel O2 prescription;To learn and understand importance of maintaining oxygen  saturations>88%;To learn and demonstrate proper use of respiratory medications;To learn and understand importance of monitoring SPO2 with pulse oximeter and demonstrate accurate use of the pulse oximeter.;To learn and demonstrate proper pursed lip breathing techniques or other breathing techniques.       Long  Term Goals -- Exhibits compliance with exercise, home  and travel O2 prescription;Verbalizes importance of monitoring SPO2 with pulse oximeter and return demonstration;Maintenance of O2 saturations>88%;Exhibits proper breathing techniques, such as pursed lip breathing or other method taught during program session;Compliance with respiratory medication;Demonstrates proper use of MDI's Exhibits compliance with exercise, home  and travel O2 prescription;Verbalizes importance of monitoring SPO2 with pulse oximeter and return demonstration;Maintenance of O2 saturations>88%;Exhibits proper breathing techniques, such as pursed lip breathing or other method taught during program session;Compliance with respiratory medication;Demonstrates proper use of MDI's     Comments -- 3L --     Goals/Expected Outcomes Compliance and understanding of oxygen  saturation monitoring and breathing techniques to decrease shortness of breath. Compliance and understanding of oxygen  saturation monitoring and breathing techniques to decrease shortness of breath. Compliance and understanding of oxygen  saturation monitoring and breathing techniques to decrease shortness of breath.        Oxygen  Discharge (Final Oxygen  Re-Evaluation):  Oxygen  Re-Evaluation - 06/02/24 0846       Program Oxygen  Prescription   Program Oxygen  Prescription Continuous    Liters per minute 3    Comments has had one day that she needed 4L      Home Oxygen    Home Oxygen  Device None    Sleep Oxygen  Prescription None    Home Exercise Oxygen  Prescription None    Home Resting Oxygen  Prescription None      Goals/Expected Outcomes   Short Term Goals To learn and exhibit compliance with exercise, home and travel O2 prescription;To learn and understand importance of maintaining oxygen  saturations>88%;To learn and demonstrate proper use of respiratory medications;To learn and understand importance of monitoring SPO2 with pulse oximeter and demonstrate  accurate use of the pulse oximeter.;To learn and demonstrate proper pursed lip breathing techniques or other breathing techniques.     Long  Term Goals Exhibits compliance with exercise, home  and travel O2 prescription;Verbalizes importance of monitoring SPO2 with pulse oximeter and return demonstration;Maintenance of O2 saturations>88%;Exhibits proper breathing techniques, such as pursed lip breathing or other method taught during program session;Compliance with respiratory medication;Demonstrates proper use of MDI's    Goals/Expected Outcomes Compliance and understanding of oxygen  saturation monitoring and breathing techniques to decrease shortness of breath.          Initial Exercise Prescription:  Initial Exercise Prescription - 04/15/24 1000       Date of Initial Exercise RX and Referring Provider   Date 04/15/24    Referring Provider McQuaid  Expected Discharge Date 07/09/24      Oxygen    Oxygen  Continuous    Liters 2    Maintain Oxygen  Saturation 88% or higher      Treadmill   MPH 1.5    Grade 0    Minutes 15    METs 2      NuStep   Level 1    SPM 60    Minutes 15    METs 1.5      Prescription Details   Frequency (times per week) 2    Duration Progress to 30 minutes of continuous aerobic without signs/symptoms of physical distress      Intensity   THRR 40-80% of Max Heartrate 56-11    Ratings of Perceived Exertion 11-13    Perceived Dyspnea 0-4      Progression   Progression Continue to progress workloads to maintain intensity without signs/symptoms of physical distress.      Resistance Training   Training Prescription Yes    Weight red bands    Reps 10-15          Perform Capillary Blood Glucose checks as needed.  Exercise Prescription Changes:   Exercise Prescription Changes     Row Name 04/28/24 1500 05/12/24 1500 05/26/24 1500 06/09/24 1500       Response to Exercise   Blood Pressure (Admit) 118/70 118/64 118/70 130/68    Blood Pressure  (Exercise) 120/84 130/80 142/74 138/70    Blood Pressure (Exit) 116/66 122/70 98/70 132/68    Heart Rate (Admit) 90 bpm 81 bpm 78 bpm 88 bpm    Heart Rate (Exercise) 104 bpm 110 bpm 116 bpm 120 bpm    Heart Rate (Exit) 91 bpm 91 bpm 92 bpm 92 bpm    Oxygen  Saturation (Admit) 96 % 97 % 97 % 88 %  RA    Oxygen  Saturation (Exercise) 91 % 90 % 90 % 89 %  3L    Oxygen  Saturation (Exit) 93 % 92 % 95 % 92 %  RA    Rating of Perceived Exertion (Exercise) 12 13 12 14     Perceived Dyspnea (Exercise) 2 2 3 2     Duration Continue with 30 min of aerobic exercise without signs/symptoms of physical distress. Continue with 30 min of aerobic exercise without signs/symptoms of physical distress. Continue with 30 min of aerobic exercise without signs/symptoms of physical distress. Continue with 30 min of aerobic exercise without signs/symptoms of physical distress.    Intensity THRR unchanged THRR unchanged THRR unchanged THRR unchanged      Progression   Progression Continue to progress workloads to maintain intensity without signs/symptoms of physical distress. Continue to progress workloads to maintain intensity without signs/symptoms of physical distress. Continue to progress workloads to maintain intensity without signs/symptoms of physical distress. Continue to progress workloads to maintain intensity without signs/symptoms of physical distress.      Resistance Training   Training Prescription Yes Yes Yes Yes    Weight red bands red bands red bands red bands    Reps 10-15 10-15 10-15 10-15    Time 10 Minutes 10 Minutes 10 Minutes 10 Minutes      Oxygen    Oxygen  Continuous Continuous Continuous Continuous    Liters 3 3 3-4 2-3      Treadmill   MPH 1.5 2 1.9 1.9    Grade 1 1 1.5 1.5    Minutes 15 15 15 15     METs 2.2 2.6 2.85 2.5  NuStep   Level 2 4 5 5     SPM -- 70 70 72    Minutes 15 15 15 15     METs 2.2 2.5 2.8 3      Oxygen    Maintain Oxygen  Saturation 88% or higher 88% or higher 88%  or higher 88% or higher       Exercise Comments:   Exercise Comments     Row Name 04/21/24 1205           Exercise Comments Pt completed first day of group exercise. She walked on the treadmill 1.5 mph, 0 incline for 15 min, METs 2.15. She then exercised on the recumbent stepper for 15 min, level 2, METs 2.2. Pt tolerated well. Performed warm up and cool down without limitations, including squats. Discussed METs with good reception.          Exercise Goals and Review:   Exercise Goals     Row Name 04/15/24 1105             Exercise Goals   Increase Physical Activity Yes       Intervention Provide advice, education, support and counseling about physical activity/exercise needs.;Develop an individualized exercise prescription for aerobic and resistive training based on initial evaluation findings, risk stratification, comorbidities and participant's personal goals.       Expected Outcomes Short Term: Attend rehab on a regular basis to increase amount of physical activity.;Long Term: Add in home exercise to make exercise part of routine and to increase amount of physical activity.;Long Term: Exercising regularly at least 3-5 days a week.       Increase Strength and Stamina Yes       Intervention Provide advice, education, support and counseling about physical activity/exercise needs.;Develop an individualized exercise prescription for aerobic and resistive training based on initial evaluation findings, risk stratification, comorbidities and participant's personal goals.       Expected Outcomes Short Term: Increase workloads from initial exercise prescription for resistance, speed, and METs.;Short Term: Perform resistance training exercises routinely during rehab and add in resistance training at home;Long Term: Improve cardiorespiratory fitness, muscular endurance and strength as measured by increased METs and functional capacity ( )       Able to understand and use rate of  perceived exertion (RPE) scale Yes       Intervention Provide education and explanation on how to use RPE scale       Expected Outcomes Short Term: Able to use RPE daily in rehab to express subjective intensity level;Long Term:  Able to use RPE to guide intensity level when exercising independently       Able to understand and use Dyspnea scale Yes       Intervention Provide education and explanation on how to use Dyspnea scale       Expected Outcomes Short Term: Able to use Dyspnea scale daily in rehab to express subjective sense of shortness of breath during exertion;Long Term: Able to use Dyspnea scale to guide intensity level when exercising independently       Knowledge and understanding of Target Heart Rate Range (THRR) Yes       Intervention Provide education and explanation of THRR including how the numbers were predicted and where they are located for reference       Expected Outcomes Short Term: Able to state/look up THRR;Long Term: Able to use THRR to govern intensity when exercising independently;Short Term: Able to use daily as guideline for intensity in rehab  Understanding of Exercise Prescription Yes       Intervention Provide education, explanation, and written materials on patient's individual exercise prescription       Expected Outcomes Short Term: Able to explain program exercise prescription;Long Term: Able to explain home exercise prescription to exercise independently          Exercise Goals Re-Evaluation :  Exercise Goals Re-Evaluation     Row Name 04/15/24 1105 05/07/24 0854 06/02/24 0848         Exercise Goal Re-Evaluation   Exercise Goals Review Increase Physical Activity;Able to understand and use Dyspnea scale;Understanding of Exercise Prescription;Increase Strength and Stamina;Knowledge and understanding of Target Heart Rate Range (THRR);Able to understand and use rate of perceived exertion (RPE) scale Increase Physical Activity;Able to understand and use  Dyspnea scale;Understanding of Exercise Prescription;Increase Strength and Stamina;Knowledge and understanding of Target Heart Rate Range (THRR);Able to understand and use rate of perceived exertion (RPE) scale Increase Physical Activity;Able to understand and use Dyspnea scale;Understanding of Exercise Prescription;Increase Strength and Stamina;Knowledge and understanding of Target Heart Rate Range (THRR);Able to understand and use rate of perceived exertion (RPE) scale     Comments Sharman is scheduled to begin exercise on 7/15. Will continue to monitor and progress as able. Sharman has completed 5 exercise sessions with good attendance. She is walking on the treadmill for 15 min, 1.5 mph, 1% incline, METs 2.2. She then is exercising on the recumbent stepper for 15 min, level 3 now, METs 2.2. She performs warm up and cool down unlimited, using red bands. Will progress to blue, 5.8 lbs. Will continue to monitor and progress as able. Sharman has completed 12 exercise sessions with good attendance. She is walking on the treadmill for 15 min, 1.7 mph, 1.5% incline, METs 2.65. She then is exercising on the recumbent stepper for 15 min, level 5 now, METs 2.8. She performs warm up and cool down unlimited, using blue bands, 5.8 lbs. Will continue to monitor and progress as able. She is motivated.     Expected Outcomes i. Through exercise at rehab and home, the patient will decrease shortness of breath with daily activities and feel confident in carrying out an exercise regimen at home. i. Through exercise at rehab and home, the patient will decrease shortness of breath with daily activities and feel confident in carrying out an exercise regimen at home. Through exercise at rehab and home, the patient will decrease shortness of breath with daily activities and feel confident in carrying out an exercise regimen at home.        Discharge Exercise Prescription (Final Exercise Prescription Changes):  Exercise Prescription  Changes - 06/09/24 1500       Response to Exercise   Blood Pressure (Admit) 130/68    Blood Pressure (Exercise) 138/70    Blood Pressure (Exit) 132/68    Heart Rate (Admit) 88 bpm    Heart Rate (Exercise) 120 bpm    Heart Rate (Exit) 92 bpm    Oxygen  Saturation (Admit) 88 %   RA   Oxygen  Saturation (Exercise) 89 %   3L   Oxygen  Saturation (Exit) 92 %   RA   Rating of Perceived Exertion (Exercise) 14    Perceived Dyspnea (Exercise) 2    Duration Continue with 30 min of aerobic exercise without signs/symptoms of physical distress.    Intensity THRR unchanged      Progression   Progression Continue to progress workloads to maintain intensity without signs/symptoms of physical distress.  Resistance Training   Training Prescription Yes    Weight red bands    Reps 10-15    Time 10 Minutes      Oxygen    Oxygen  Continuous    Liters 2-3      Treadmill   MPH 1.9    Grade 1.5    Minutes 15    METs 2.5      NuStep   Level 5    SPM 72    Minutes 15    METs 3      Oxygen    Maintain Oxygen  Saturation 88% or higher          Nutrition:  Target Goals: Understanding of nutrition guidelines, daily intake of sodium 1500mg , cholesterol 200mg , calories 30% from fat and 7% or less from saturated fats, daily to have 5 or more servings of fruits and vegetables.  Biometrics:  Pre Biometrics - 04/15/24 0919       Pre Biometrics   Grip Strength 10 kg           Nutrition Therapy Plan and Nutrition Goals:  Nutrition Therapy & Goals - 05/21/24 1438       Nutrition Therapy   Diet General Healthy Diet    Drug/Food Interactions Statins/Certain Fruits      Personal Nutrition Goals   Nutrition Goal Patient to continue diet quality by using the plate method as a guide for meal planning to include lean protein/plant protein, fruits, vegetables, whole grains, nonfat dairy as part of a well-balanced diet.    Comments Goal in progress. Sharman has medical history of COPD. high  cholesterol. A1c and lipids are well controlled. BMI is appropriate for age. She has maintained her weight since starting with our program.  Patient will benefit from participation in pulmonary rehab for nutrition education, exercise, and lifestyle modification.      Intervention Plan   Intervention Prescribe, educate and counsel regarding individualized specific dietary modifications aiming towards targeted core components such as weight, hypertension, lipid management, diabetes, heart failure and other comorbidities.;Nutrition handout(s) given to patient.    Expected Outcomes Short Term Goal: Understand basic principles of dietary content, such as calories, fat, sodium, cholesterol and nutrients.;Long Term Goal: Adherence to prescribed nutrition plan.          Nutrition Assessments:  Nutrition Assessments - 04/21/24 1207       Rate Your Plate Scores   Pre Score 58         MEDIFICTS Score Key: >=70 Need to make dietary changes  40-70 Heart Healthy Diet <= 40 Therapeutic Level Cholesterol Diet  Flowsheet Row PULMONARY REHAB OTHER RESPIRATORY from 04/21/2024 in [facility]  Picture Your Plate Total Score on Admission 58   Picture Your Plate Scores: <59 Unhealthy dietary pattern with much room for improvement. 41-50 Dietary pattern unlikely to meet recommendations for good health and room for improvement. 51-60 More healthful dietary pattern, with some room for improvement.  >60 Healthy dietary pattern, although there may be some specific behaviors that could be improved.    Nutrition Goals Re-Evaluation:  Nutrition Goals Re-Evaluation     Row Name 04/21/24 1040 05/21/24 1438           Goals   Current Weight 175 lb 7.8 oz (79.6 kg) 175 lb 0.7 oz (79.4 kg)      Comment A1c WNL, triglyceride 202, LDL 93, HDL 70 A1c WNL, triglyceride 202, LDL 93, HDL 70  Expected Outcome Sharman has medical history of COPD. high  cholesterol. A1c and lipids are well controlled. She follows a regular diet. BMI is appropriate for age. Patient will benefit from participation in pulmonary rehab for nutrition education, exercise, and lifestyle modification. Goal in progress. Sharman has medical history of COPD. high cholesterol. A1c and lipids are well controlled. BMI is appropriate for age. She has maintained her weight since starting with our program. Patient will benefit from participation in pulmonary rehab for nutrition education, exercise, and lifestyle modification.         Nutrition Goals Discharge (Final Nutrition Goals Re-Evaluation):  Nutrition Goals Re-Evaluation - 05/21/24 1438       Goals   Current Weight 175 lb 0.7 oz (79.4 kg)    Comment A1c WNL, triglyceride 202, LDL 93, HDL 70    Expected Outcome Goal in progress. Sharman has medical history of COPD. high cholesterol. A1c and lipids are well controlled. BMI is appropriate for age. She has maintained her weight since starting with our program. Patient will benefit from participation in pulmonary rehab for nutrition education, exercise, and lifestyle modification.          Psychosocial: Target Goals: Acknowledge presence or absence of significant depression and/or stress, maximize coping skills, provide positive support system. Participant is able to verbalize types and ability to use techniques and skills needed for reducing stress and depression.  Initial Review & Psychosocial Screening:  Initial Psych Review & Screening - 04/15/24 0933       Initial Review   Current issues with Current Psychotropic Meds;Current Stress Concerns    Source of Stress Concerns Chronic Illness    Comments Pt is worried and concerned about her 2 grandchildren that have been through alot in the past years. Losing their father and their only uncle and moving from [place] to [place]. Pt states she has to live long and be there for them and her daughter      Family Dynamics   Good Support  System? Yes      Barriers   Psychosocial barriers to participate in program Psychosocial barriers identified (see note)      Screening Interventions   Interventions Encouraged to exercise;To provide support and resources with identified psychosocial needs;Provide feedback about the scores to participant    Expected Outcomes Short Term goal: Utilizing psychosocial counselor, staff and physician to assist with identification of specific Stressors or current issues interfering with healing process. Setting desired goal for each stressor or current issue identified.;Long Term Goal: Stressors or current issues are controlled or eliminated.;Short Term goal: Identification and review with participant of any Quality of Life or Depression concerns found by scoring the questionnaire.;Long Term goal: The participant improves quality of Life and PHQ9 Scores as seen by post scores and/or verbalization of changes          Quality of Life Scores:  Scores of 19 and below usually indicate a poorer quality of life in these areas.  A difference of  2-3 points is a clinically meaningful difference.  A difference of 2-3 points in the total score of the Quality of Life Index has been associated with significant improvement in overall quality of life, self-image, physical symptoms, and general health in studies assessing change in quality of life.  PHQ-9: Review Flowsheet  More data exists      04/21/2024 04/15/2024 05/14/2023 04/09/2023 02/07/2023  Depression screen PHQ 2/9  Decreased Interest 0 0 0 0 0  Down, Depressed, Hopeless 0 1 0 1  0  PHQ - 2 Score 0 1 0 1 0  Altered sleeping - 0 - - -  Tired, decreased energy - 0 - - -  Change in appetite - 0 - - -  Feeling bad or failure about yourself  - 0 - - -  Trouble concentrating - 1 - - -  Moving slowly or fidgety/restless - 0 - - -  Suicidal thoughts - 0 - - -  PHQ-9 Score - 2 - - -  Difficult doing work/chores - Not difficult at all - - -   Interpretation of  Total Score  Total Score Depression Severity:  1-4 = Minimal depression, 5-9 = Mild depression, 10-14 = Moderate depression, 15-19 = Moderately severe depression, 20-27 = Severe depression   Psychosocial Evaluation and Intervention:  Psychosocial Evaluation - 04/15/24 0937       Psychosocial Evaluation & Interventions   Interventions Relaxation education;Encouraged to exercise with the program and follow exercise prescription    Comments Pt is worried and concerned about her 2 grandchildren that have been through alot in the past years. Losing their father and their only uncle and moving from [place] to [place]. Pt states she has to live long and be there for them and her daughter    Expected Outcomes For Sharman to participate in PR without any psy/soc barriers or concerns    Continue Psychosocial Services  Follow up required by staff          Psychosocial Re-Evaluation:  Psychosocial Re-Evaluation     Row Name 05/08/24 1405 06/03/24 1656           Psychosocial Re-Evaluation   Current issues with Current Psychotropic Meds;Current Stress Concerns Current Psychotropic Meds;Current Stress Concerns      Comments Monthly psychosocial re-evaluation as follows: Sharman still worries about her 2 grandchildren after losing their father and uncle. She sees them weekly and assists her daughter out in caring for them during the week. She states they are not happy to be returning to school after being out for the summer. She declines any additional needs or resources at the time. Monthly psychosocial re-evaluation as follows: No changes since previous assessment. Sharman still worries about her 2 grandchildren after losing their father and uncle. She sees them weekly and assists her daughter in caring for them during the week. She has support from her family and friends. She declines any additional needs or resources at the time.      Expected Outcomes For Sharman to attend pulmonary rehab and to have a positive  outlook and good coping skills to manage her stress. To ask for resources or referrals if needed. For Sharman to attend pulmonary rehab and to have a positive outlook and good coping skills to manage her stress. To ask for resources or referrals if needed.      Interventions Stress management education;Encouraged to attend Pulmonary Rehabilitation for the exercise Encouraged to attend Pulmonary Rehabilitation for the exercise      Continue Psychosocial Services  No Follow up required Follow up required by staff         Psychosocial Discharge (Final Psychosocial Re-Evaluation):  Psychosocial Re-Evaluation - 06/03/24 1656       Psychosocial Re-Evaluation   Current issues with Current Psychotropic Meds;Current Stress Concerns    Comments Monthly psychosocial re-evaluation as follows: No changes since previous assessment. Sharman still worries about her 2 grandchildren after losing their father and uncle. She sees them weekly and assists her daughter in caring for  them during the week. She has support from her family and friends. She declines any additional needs or resources at the time.    Expected Outcomes For Sharman to attend pulmonary rehab and to have a positive outlook and good coping skills to manage her stress. To ask for resources or referrals if needed.    Interventions Encouraged to attend Pulmonary Rehabilitation for the exercise    Continue Psychosocial Services  Follow up required by staff          Education: Education Goals: Education classes will be provided on a weekly basis, covering required topics. Participant will state understanding/return demonstration of topics presented.  Learning Barriers/Preferences:  Learning Barriers/Preferences - 04/15/24 9061       Learning Barriers/Preferences   Learning Barriers Sight    Learning Preferences None          Education Topics: Know Your Numbers Group instruction that is supported by a PowerPoint presentation. Instructor  discusses importance of knowing and understanding resting, exercise, and post-exercise oxygen  saturation, heart rate, and blood pressure. Oxygen  saturation, heart rate, blood pressure, rating of perceived exertion, and dyspnea are reviewed along with a normal range for these values.    Exercise for the Pulmonary Patient Group instruction that is supported by a PowerPoint presentation. Instructor discusses benefits of exercise, core components of exercise, frequency, duration, and intensity of an exercise routine, importance of utilizing pulse oximetry during exercise, safety while exercising, and options of places to exercise outside of rehab.    MET Level  Group instruction provided by PowerPoint, verbal discussion, and written material to support subject matter. Instructor reviews what METs are and how to increase METs.  Flowsheet Row PULMONARY REHAB OTHER RESPIRATORY from 05/21/2024 in [facility] for Heart, Vascular, & Lung Health  Date 05/21/24  Educator EP  Instruction Review Code 1- Verbalizes Understanding    Pulmonary Medications Verbally interactive group education provided by instructor with focus on inhaled medications and proper administration.   Anatomy and Physiology of the Respiratory System Group instruction provided by PowerPoint, verbal discussion, and written material to support subject matter. Instructor reviews respiratory cycle and anatomical components of the respiratory system and their functions. Instructor also reviews differences in obstructive and restrictive respiratory diseases with examples of each.  Flowsheet Row PULMONARY REHAB OTHER RESPIRATORY from 06/04/2024 in [facility] for Heart, Vascular, & Lung Health  Date 06/04/24  Educator RT  Instruction Review Code 1- Verbalizes Understanding    Oxygen  Safety Group instruction provided by PowerPoint, verbal discussion, and written material to support subject  matter. There is an overview of "What is Oxygen " and "Why do we need it".  Instructor also reviews how to create a safe environment for oxygen  use, the importance of using oxygen  as prescribed, and the risks of noncompliance. There is a brief discussion on traveling with oxygen  and resources the patient may utilize.   Oxygen  Use Group instruction provided by PowerPoint, verbal discussion, and written material to discuss how supplemental oxygen  is prescribed and different types of oxygen  supply systems. Resources for more information are provided.    Breathing Techniques Group instruction that is supported by demonstration and informational handouts. Instructor discusses the benefits of pursed lip and diaphragmatic breathing and detailed demonstration on how to perform both.  Flowsheet Row PULMONARY REHAB OTHER RESPIRATORY from 04/23/2024 in [facility] for Heart, Vascular, & Lung Health  Date 04/23/24  Educator RN  Instruction Review Code 1- Verbalizes Understanding  Risk Factor Reduction Group instruction that is supported by a PowerPoint presentation. Instructor discusses the definition of a risk factor, different risk factors for pulmonary disease, and how the heart and lungs work together.   Pulmonary Diseases Group instruction provided by PowerPoint, verbal discussion, and written material to support subject matter. Instructor gives an overview of the different type of pulmonary diseases. There is also a discussion on risk factors and symptoms as well as ways to manage the diseases. Flowsheet Row PULMONARY REHAB OTHER RESPIRATORY from 05/28/2024 in [facility] for Heart, Vascular, & Lung Health  Date 05/28/24  Educator RT  Instruction Review Code 1- Verbalizes Understanding    Stress and Energy Conservation Group instruction provided by PowerPoint, verbal discussion, and written material to support subject matter. Instructor gives an  overview of stress and the impact it can have on the body. Instructor also reviews ways to reduce stress. There is also a discussion on energy conservation and ways to conserve energy throughout the day. Flowsheet Row PULMONARY REHAB OTHER RESPIRATORY from 04/30/2024 in [facility] for Heart, Vascular, & Lung Health  Date 04/30/24  Educator RN  Instruction Review Code 1- Verbalizes Understanding    Warning Signs and Symptoms Group instruction provided by PowerPoint, verbal discussion, and written material to support subject matter. Instructor reviews warning signs and symptoms of stroke, heart attack, cold and flu. Instructor also reviews ways to prevent the spread of infection. Flowsheet Row PULMONARY REHAB OTHER RESPIRATORY from 05/07/2024 in [facility] for Heart, Vascular, & Lung Health  Date 05/07/24  Educator RN  Instruction Review Code 1- Verbalizes Understanding    Other Education Group or individual verbal, written, or video instructions that support the educational goals of the pulmonary rehab program.    Knowledge Questionnaire Score:  Knowledge Questionnaire Score - 04/15/24 1113       Knowledge Questionnaire Score   Pre Score 16/18          Core Components/Risk Factors/Patient Goals at Admission:  Personal Goals and Risk Factors at Admission - 04/15/24 0938       Core Components/Risk Factors/Patient Goals on Admission    Weight Management Yes;Weight Loss    Intervention Weight Management/Obesity: Establish reasonable short term and long term weight goals.;Weight Management: Provide education and appropriate resources to help participant work on and attain dietary goals.;Obesity: Provide education and appropriate resources to help participant work on and attain dietary goals.;Weight Management: Develop a combined nutrition and exercise program designed to reach desired caloric intake, while maintaining appropriate intake of  nutrient and fiber, sodium and fats, and appropriate energy expenditure required for the weight goal.    Admit Weight 175 lb 7.8 oz (79.6 kg)    Expected Outcomes Short Term: Continue to assess and modify interventions until short term weight is achieved;Long Term: Adherence to nutrition and physical activity/exercise program aimed toward attainment of established weight goal;Weight Loss: Understanding of general recommendations for a balanced deficit meal plan, which promotes 1-2 lb weight loss per week and includes a negative energy balance of [phone] kcal/d;Understanding recommendations for meals to include 15-35% energy as protein, 25-35% energy from fat, 35-60% energy from carbohydrates, less than 200mg  of dietary cholesterol, 20-35 gm of total fiber daily;Understanding of distribution of calorie intake throughout the day with the consumption of 4-5 meals/snacks    Improve shortness of breath with ADL's Yes    Intervention Provide education, individualized exercise plan and daily activity instruction to help decrease  symptoms of SOB with activities of daily living.    Expected Outcomes Short Term: Improve cardiorespiratory fitness to achieve a reduction of symptoms when performing ADLs;Long Term: Be able to perform more ADLs without symptoms or delay the onset of symptoms          Core Components/Risk Factors/Patient Goals Review:   Goals and Risk Factor Review     Row Name 05/08/24 1408 06/03/24 1704           Core Components/Risk Factors/Patient Goals Review   Personal Goals Review Weight Management/Obesity;Improve shortness of breath with ADL's;Develop more efficient breathing techniques such as purse lipped breathing and diaphragmatic breathing and practicing self-pacing with activity. Weight Management/Obesity;Improve shortness of breath with ADL's      Review Monthly review of patient's Core Components/Risk Factors/Patient Goals are as follows: Goal in progress for improving her  shortness of breath with ADLs. [name] had to be started on oxygen  to maintain her saturations. She recently needed to increase to 3L with exertion. She has been exercising on the treadmill and NuStep. She has completed 7 sessions and is making progress. Goal met on developing more efficient breathing techniques such as purse lipped breathing and diaphragmatic breathing; and practicing self-pacing with activity. Sharman can initiate pursed lip breathing and is practicing diaphragmatic breathing before bed. She can self-pace when needed and allows herself to take breaks while walking or exercising. Goal progressing on weight loss. Sharman has been working with our dietician on ways to incorporate the plate method and choosing a wide variety of foods. She is working on decreasing her caloric intake as well as eating out less. She is down ~1.8#. We will continue to monitor [name]'s progress throughout the program. Monthly review of patient's Core Components/Risk Factors/Patient Goals are as follows: Goal in progress for improving her shortness of breath with ADLs. Sharman had to be started on oxygen  to maintain her saturations. She recently needed to increase to 3L with exertion. She has been exercising on the treadmill and NuStep. She has completed 13 sessions and is making great progress. Goal progressing on weight loss. Sharman has been working with our dietician on ways to incorporate the plate method and choosing a wide variety of foods. She is working on decreasing her caloric intake as well as eating out less. Her weight has maintained since starting the program. We will continue to monitor [name]'s progress throughout the program.      Expected Outcomes Pt will show progress toward meeting expected goals and outcomes. Pt will show progress toward meeting expected goals and outcomes.         Core Components/Risk Factors/Patient Goals at Discharge (Final Review):   Goals and Risk Factor Review - 06/03/24 1704        Core Components/Risk Factors/Patient Goals Review   Personal Goals Review Weight Management/Obesity;Improve shortness of breath with ADL's    Review Monthly review of patient's Core Components/Risk Factors/Patient Goals are as follows: Goal in progress for improving her shortness of breath with ADLs. Sharman had to be started on oxygen  to maintain her saturations. She recently needed to increase to 3L with exertion. She has been exercising on the treadmill and NuStep. She has completed 13 sessions and is making great progress. Goal progressing on weight loss. Sharman has been working with our dietician on ways to incorporate the plate method and choosing a wide variety of foods. She is working on decreasing her caloric intake as well as eating out less. Her weight has maintained since  starting the program. We will continue to monitor [name]'s progress throughout the program.    Expected Outcomes Pt will show progress toward meeting expected goals and outcomes.          ITP Comments: Pt is making expected progress toward Pulmonary Rehab goals after completing 15 session(s). Recommend continued exercise, life style modification, education, and utilization of breathing techniques to increase stamina and strength, while also decreasing shortness of breath with exertion.  Dr. Slater Staff is Medical Director for Pulmonary Rehab at [facility].

## 2024-06-11 ENCOUNTER — Encounter (HOSPITAL_COMMUNITY)
Admission: RE | Admit: 2024-06-11 | Discharge: 2024-06-11 | Disposition: A | Discharge status: Home / Self Care | Source: Ambulatory Visit | Attending: Pulmonary Disease | Admitting: Pulmonary Disease

## 2024-06-11 DIAGNOSIS — J432 Centrilobular emphysema: Secondary | ICD-10-CM

## 2024-06-11 DIAGNOSIS — J9611 Chronic respiratory failure with hypoxia: Secondary | ICD-10-CM

## 2024-06-11 NOTE — Progress Notes (Signed)
 Daily Session Note  Patient Details  Name: Christine Barry MRN: [id number] Date of Birth: [birth year] Referring Provider:   [name] Pulmonary Rehab Walk Test from 04/15/2024 in [facility] for [facility], Vascular, & Lung Health  Referring Provider McQuaid    Encounter Date: 06/11/2024  Check In:  Session Check In - 06/11/24 1336       Check-In   Supervising physician immediately available to respond to emergencies [facility] MD immediately available    Physician(s) Barnie Hila, NP    Location [facility]    Staff Present Johnnie Moats, MS, ACSM-CEP, Exercise Physiologist;Cheyane Ayon Claudene Candia Levin, RN, BSN;Randi Reeve BS, ACSM-CEP, Exercise Physiologist    Virtual Visit No    Medication changes reported     No    Fall or balance concerns reported    No    Tobacco Cessation No Change    Warm-up and Cool-down Performed as group-led instruction    Resistance Training Performed Yes    VAD Patient? No    PAD/SET Patient? No      Pain Assessment   Currently in Pain? No/denies    Multiple Pain Sites No          Capillary Blood Glucose: No results found for this or any previous visit (from the past 24 hours).    Social History   Tobacco Use  Smoking Status Former   Current packs/day: 0.00   Average packs/day: 2.0 packs/day for 38.7 years (77.5 ttl pk-yrs)   Types: Cigarettes   Start date: 10/08/1961   Quit date: 07/07/2000   Years since quitting: 23.9  Smokeless Tobacco Never    Goals Met:  Proper associated with RPD/PD & O2 Sat Independence with exercise equipment Exercise tolerated well No report of concerns or symptoms today Strength training completed today  Goals Unmet:  Not Applicable  Comments: Service time is from 1309 to 1448.    Dr. Slater Staff is Medical Director for Pulmonary Rehab at [facility]

## 2024-06-12 NOTE — Telephone Encounter (Signed)
 OK to send fax this message to Christine Barry Pulmonary rehab:  Increase target heart rate to 130 while exercising.  Thresa Lennert MD [facility] Pulmonary and Critical Care  Mobile [phone] Office: [phone]

## 2024-06-12 NOTE — Telephone Encounter (Signed)
 LMOM for someone to return call.

## 2024-06-12 NOTE — Telephone Encounter (Signed)
 LMOM for someone at [facility] Pulmonary Rehab to return call.

## 2024-06-15 DIAGNOSIS — J9611 Chronic respiratory failure with hypoxia: Secondary | ICD-10-CM | POA: Diagnosis not present

## 2024-06-15 DIAGNOSIS — J449 Chronic obstructive pulmonary disease, unspecified: Secondary | ICD-10-CM | POA: Diagnosis not present

## 2024-06-15 NOTE — Telephone Encounter (Signed)
 LMOM fro Pulmonary Rehab to return call.

## 2024-06-15 NOTE — Telephone Encounter (Signed)
 LMOM for a return call from someone t the [facility] Pulmonary rehab.

## 2024-06-16 ENCOUNTER — Encounter (HOSPITAL_COMMUNITY)
Admission: RE | Admit: 2024-06-16 | Discharge: 2024-06-16 | Disposition: A | Discharge status: Home / Self Care | Source: Ambulatory Visit | Attending: Pulmonary Disease | Admitting: Pulmonary Disease

## 2024-06-16 ENCOUNTER — Telehealth (HOSPITAL_COMMUNITY): Payer: Self-pay

## 2024-06-16 DIAGNOSIS — J9611 Chronic respiratory failure with hypoxia: Secondary | ICD-10-CM

## 2024-06-16 DIAGNOSIS — J432 Centrilobular emphysema: Secondary | ICD-10-CM

## 2024-06-16 NOTE — Progress Notes (Signed)
 Daily Session Note  Patient Details  Name: Christine Barry MRN: [id number] Date of Birth: 1941/04/28 Referring Provider:   Conrad Barry Pulmonary Rehab Walk Test from 04/15/2024 in [facility] for Heart, Vascular, & Lung Health  Referring Provider McQuaid    Encounter Date: 06/16/2024  Check In:  Session Check In - 06/16/24 1427       Check-In   Supervising physician immediately available to respond to emergencies [facility] MD immediately available    Physician(s) Christine Beauvais, NP    Location [facility]    Staff Present Christine Moats, MS, ACSM-CEP, Exercise Physiologist;Christine Muldrow Claudene Candia Levin, RN, BSN;Christine Barry BS, ACSM-CEP, Exercise Physiologist    Virtual Visit No    Medication changes reported     No    Fall or balance concerns reported    No    Tobacco Cessation No Change    Warm-up and Cool-down Performed as group-led instruction    Resistance Training Performed Yes    VAD Patient? No    PAD/SET Patient? No      Pain Assessment   Currently in Pain? No/denies    Multiple Pain Sites No          Capillary Blood Glucose: No results found for this or any previous visit (from the past 24 hours).    Social History   Tobacco Use  Smoking Status Former   Current packs/day: 0.00   Average packs/day: 2.0 packs/day for 38.7 years (77.5 ttl pk-yrs)   Types: Cigarettes   Start date: 10/08/1961   Quit date: 07/07/2000   Years since quitting: 23.9  Smokeless Tobacco Never    Goals Met:  Proper associated with RPD/PD & O2 Sat Independence with exercise equipment Exercise tolerated well No report of concerns or symptoms today Strength training completed today  Goals Unmet:  Not Applicable  Comments: Service time is from 1321 to 1451.    Dr. Slater Staff is Medical Director for Pulmonary Rehab at [facility].

## 2024-06-16 NOTE — Telephone Encounter (Signed)
 Received document from Dr. Spurgeon office to increase THR for pulmonary rehab.

## 2024-06-18 ENCOUNTER — Encounter (HOSPITAL_COMMUNITY)
Admission: RE | Admit: 2024-06-18 | Discharge: 2024-06-18 | Disposition: A | Discharge status: Home / Self Care | Source: Ambulatory Visit | Attending: Pulmonary Disease

## 2024-06-18 DIAGNOSIS — J9611 Chronic respiratory failure with hypoxia: Secondary | ICD-10-CM

## 2024-06-18 DIAGNOSIS — J432 Centrilobular emphysema: Secondary | ICD-10-CM

## 2024-06-18 NOTE — Progress Notes (Signed)
 Daily Session Note  Patient Details  Name: Christine Barry MRN: [id number] Date of Birth: 1940/12/31 Referring Provider:   [name] Pulmonary Rehab Walk Test from 04/15/2024 in [facility] for Heart, Vascular, & Lung Health  Referring Provider McQuaid    Encounter Date: 06/18/2024  Check In:  Session Check In - 06/18/24 1508       Check-In   Supervising physician immediately available to respond to emergencies [facility] immediately available    Physician(s) Orren Fabry, PA    [facility]    Staff Present Johnnie Moats, MS, ACSM-CEP, Exercise Physiologist;Henderson Frampton Claudene Candia Levin, RN, BSN;Randi Reeve BS, ACSM-CEP, Exercise Physiologist    Virtual Visit No    Medication changes reported     No    Fall or balance concerns reported    No    Tobacco Cessation No Change    Warm-up and Cool-down Performed as group-led instruction    Resistance Training Performed Yes    VAD Patient? No    PAD/SET Patient? No      Pain Assessment   Currently in Pain? No/denies    Multiple Pain Sites No          Capillary Blood Glucose: No results found for this or any previous visit (from the past 24 hours).    Social History   Tobacco Use  Smoking Status Former   Current packs/day: 0.00   Average packs/day: 2.0 packs/day for 38.7 years (77.5 ttl pk-yrs)   Types: Cigarettes   Start date: 10/08/1961   Quit date: 07/07/2000   Years since quitting: 23.9  Smokeless Tobacco Never    Goals Met:  Proper associated with RPD/PD & O2 Sat Independence with exercise equipment Exercise tolerated well No report of concerns or symptoms today Strength training completed today  Goals Unmet:  Not Applicable  Comments: Service time is from 1311 to 1447.    Dr. Slater Staff is Medical Director for Pulmonary Rehab at [facility].

## 2024-06-21 DIAGNOSIS — J449 Chronic obstructive pulmonary disease, unspecified: Secondary | ICD-10-CM | POA: Diagnosis not present

## 2024-06-21 DIAGNOSIS — J9611 Chronic respiratory failure with hypoxia: Secondary | ICD-10-CM | POA: Diagnosis not present

## 2024-06-23 ENCOUNTER — Encounter (HOSPITAL_COMMUNITY)
Admission: RE | Admit: 2024-06-23 | Discharge: 2024-06-23 | Disposition: A | Discharge status: Home / Self Care | Source: Ambulatory Visit | Attending: Pulmonary Disease

## 2024-06-23 VITALS — Wt 174.4 lb

## 2024-06-23 DIAGNOSIS — J9611 Chronic respiratory failure with hypoxia: Secondary | ICD-10-CM

## 2024-06-23 DIAGNOSIS — J432 Centrilobular emphysema: Secondary | ICD-10-CM

## 2024-06-23 NOTE — Progress Notes (Signed)
 Home Exercise Prescription I have reviewed a Home Exercise Prescription with Christine Barry. She is currently doing the warm up and cool down exercises. Encouraged her to try walking in her house 15 min x2 or 30 min. She also could go to the gym and exercise on the recumbent stepper if she feels comfortable. Encouraged pt to add resistance or speed as she is able. The patient stated that their goals were to keep functioning well for her grandkids and to keep exercising. We reviewed exercise guidelines, target heart rate during exercise, RPE Scale, weather conditions, endpoints for exercise, warmup and cool down. The patient is encouraged to come to me with any questions. I will continue to follow up with the patient to assist them with progression and safety.  Spent 15 min discussing home exercise plan and goals.  Brysan Mcevoy [place], [place], ACSM-CEP 06/23/2024 3:47 PM

## 2024-06-23 NOTE — Progress Notes (Signed)
 Daily Session Note  Patient Details  Name: Monnica Saltsman MRN: [id number] Date of Birth: May 25, 1941 Referring Provider:   [name] Pulmonary Rehab Walk Test from 04/15/2024 in [facility] for Heart, Vascular, & Lung Health  Referring Provider McQuaid    Encounter Date: 06/23/2024  Check In:  Session Check In - 06/23/24 1326       Check-In   Supervising physician immediately available to respond to emergencies [facility] immediately available    Physician(s) Rosabel Mose, NP    [facility]    Staff Present Johnnie Moats, MS, ACSM-CEP, Exercise Physiologist;Casey Claudene Candia Levin, RN, BSN;Randi Midge BS, ACSM-CEP, Exercise Physiologist;Johnny Fayette, MS, Exercise Physiologist    Virtual Visit No    Medication changes reported     No    Fall or balance concerns reported    No    Tobacco Cessation No Change    Warm-up and Cool-down Performed as group-led instruction    Resistance Training Performed Yes    VAD Patient? No    PAD/SET Patient? No      Pain Assessment   Currently in Pain? No/denies    Multiple Pain Sites No          Capillary Blood Glucose: No results found for this or any previous visit (from the past 24 hours).   Exercise Prescription Changes - 06/23/24 1500       Response to Exercise   Blood Pressure (Admit) 132/80    Blood Pressure (Exercise) 156/80    Blood Pressure (Exit) 122/70    Heart Rate (Admit) 98 bpm    Heart Rate (Exercise) 125 bpm    Heart Rate (Exit) 96 bpm    Oxygen  Saturation (Admit) 96 %   2L   Oxygen  Saturation (Exercise) 89 %   4L   Oxygen  Saturation (Exit) 90 %   RA   Rating of Perceived Exertion (Exercise) 14    Perceived Dyspnea (Exercise) 2    Duration Continue with 30 min of aerobic exercise without signs/symptoms of physical distress.    Intensity THRR unchanged      Progression   Progression Continue to progress workloads to maintain intensity without  signs/symptoms of physical distress.      Resistance Training   Training Prescription Yes    Weight red bands    Reps 10-15    Time 10 Minutes      Oxygen    Oxygen  Continuous    Liters 2-4      Treadmill   MPH 1.9    Grade 1.5    Minutes 15    METs 2.5      NuStep   Level 6    SPM 62    Minutes 15    METs 3      Oxygen    Maintain Oxygen  Saturation 88% or higher          Social History   Tobacco Use  Smoking Status Former   Current packs/day: 0.00   Average packs/day: 2.0 packs/day for 38.7 years (77.5 ttl pk-yrs)   Types: Cigarettes   Start date: 10/08/1961   Quit date: 07/07/2000   Years since quitting: 23.9  Smokeless Tobacco Never    Goals Met:  Independence with exercise equipment Exercise tolerated well No report of concerns or symptoms today Strength training completed today  Goals Unmet:  Not Applicable  Comments: Service time is from 1316 to 1449    Dr. Slater Staff is Medical Director  for Pulmonary Rehab at [facility].

## 2024-06-25 ENCOUNTER — Encounter (HOSPITAL_COMMUNITY)
Admission: RE | Admit: 2024-06-25 | Discharge: 2024-06-25 | Disposition: A | Discharge status: Home / Self Care | Source: Ambulatory Visit | Attending: Pulmonary Disease

## 2024-06-25 DIAGNOSIS — J9611 Chronic respiratory failure with hypoxia: Secondary | ICD-10-CM | POA: Diagnosis not present

## 2024-06-25 DIAGNOSIS — J432 Centrilobular emphysema: Secondary | ICD-10-CM

## 2024-06-25 NOTE — Progress Notes (Signed)
 Daily Session Note  Patient Details  Name: Christine Barry MRN: [id number] Date of Birth: [birth year] Referring Provider:   [name] Pulmonary Rehab Walk Test from 04/15/2024 in [facility] for Heart, Vascular, & Lung Health  Referring Provider McQuaid    Encounter Date: 06/25/2024  Check In:  Session Check In - 06/25/24 1334       Check-In   Supervising physician immediately available to respond to emergencies [facility] immediately available    Physician(s) Damien Braver, NP    [facility]    Staff Present Johnnie Moats, MS, ACSM-CEP, Exercise Physiologist;Casey Claudene Candia Levin, RN, BSN;Randi Reeve BS, ACSM-CEP, Exercise Physiologist    Virtual Visit No    Medication changes reported     No    Fall or balance concerns reported    No    Tobacco Cessation No Change    Warm-up and Cool-down Performed as group-led instruction    Resistance Training Performed Yes    VAD Patient? No    PAD/SET Patient? No      Pain Assessment   Currently in Pain? No/denies    Multiple Pain Sites No          Capillary Blood Glucose: No results found for this or any previous visit (from the past 24 hours).    Social History   Tobacco Use  Smoking Status Former   Current packs/day: 0.00   Average packs/day: 2.0 packs/day for 38.7 years (77.5 ttl pk-yrs)   Types: Cigarettes   Start date: 10/08/1961   Quit date: 07/07/2000   Years since quitting: 23.9  Smokeless Tobacco Never    Goals Met:  Proper associated with RPD/PD & O2 Sat Exercise tolerated well No report of concerns or symptoms today Strength training completed today  Goals Unmet:  Not Applicable  Comments: Service time is from 1311 to 1458.    Dr. Slater Staff is Medical Director for Pulmonary Rehab at [facility].

## 2024-06-29 ENCOUNTER — Telehealth (HOSPITAL_COMMUNITY): Payer: Self-pay

## 2024-06-29 NOTE — Telephone Encounter (Signed)
 Patient c/o for 1:15 PR class for 9/23, no reason given. She states she would like to make the class up.

## 2024-06-30 ENCOUNTER — Telehealth (HOSPITAL_COMMUNITY): Payer: Self-pay | Admitting: *Deleted

## 2024-06-30 ENCOUNTER — Encounter (HOSPITAL_COMMUNITY)

## 2024-06-30 NOTE — Telephone Encounter (Signed)
 Called pt and planned to do her 6 min walk test 9/25 at 1 pm.  Aliene Aris BS, ACSM-CEP 06/30/2024 3:36 PM

## 2024-07-02 ENCOUNTER — Encounter (HOSPITAL_COMMUNITY)
Admission: RE | Admit: 2024-07-02 | Discharge: 2024-07-02 | Disposition: A | Discharge status: Home / Self Care | Source: Ambulatory Visit | Attending: Pulmonary Disease

## 2024-07-02 DIAGNOSIS — J9611 Chronic respiratory failure with hypoxia: Secondary | ICD-10-CM | POA: Diagnosis not present

## 2024-07-02 DIAGNOSIS — J432 Centrilobular emphysema: Secondary | ICD-10-CM

## 2024-07-02 NOTE — Progress Notes (Signed)
 Daily Session Note  Patient Details  Name: Christine Barry MRN: [id number] Date of Birth: [birth year] Referring Provider:   [name] Pulmonary Rehab Walk Test from 04/15/2024 in [facility] for Heart, Vascular, & Lung Health  Referring Provider McQuaid    Encounter Date: 07/02/2024  Check In:  Session Check In - 07/02/24 1334       Check-In   Supervising physician immediately available to respond to emergencies [facility] immediately available    Physician(s) Lum Louis, NP    [facility]    Staff Present Augustin Sharps, Candia Levin, RN, BSN;Randi Reeve BS, ACSM-CEP, Exercise Physiologist    Virtual Visit No    Medication changes reported     No    Fall or balance concerns reported    No    Tobacco Cessation No Change    Warm-up and Cool-down Performed as group-led instruction    Resistance Training Performed Yes    VAD Patient? No    PAD/SET Patient? No      Pain Assessment   Currently in Pain? No/denies    Multiple Pain Sites No          Capillary Blood Glucose: No results found for this or any previous visit (from the past 24 hours).    Social History   Tobacco Use  Smoking Status Former   Current packs/day: 0.00   Average packs/day: 2.0 packs/day for 38.7 years (77.5 ttl pk-yrs)   Types: Cigarettes   Start date: 10/08/1961   Quit date: 07/07/2000   Years since quitting: 24.0  Smokeless Tobacco Never    Goals Met:  Proper associated with RPD/PD & O2 Sat Independence with exercise equipment Exercise tolerated well No report of concerns or symptoms today Strength training completed today  Goals Unmet:  Not Applicable  Comments: Service time is from 1320 to 1450.    Dr. Slater Staff is Medical Director for Pulmonary Rehab at [facility].

## 2024-07-06 DIAGNOSIS — R918 Other nonspecific abnormal finding of lung field: Secondary | ICD-10-CM | POA: Diagnosis not present

## 2024-07-06 DIAGNOSIS — J432 Centrilobular emphysema: Secondary | ICD-10-CM | POA: Diagnosis not present

## 2024-07-07 ENCOUNTER — Encounter (HOSPITAL_COMMUNITY)
Admission: RE | Admit: 2024-07-07 | Discharge: 2024-07-07 | Disposition: A | Discharge status: Home / Self Care | Source: Ambulatory Visit | Attending: Pulmonary Disease | Admitting: Pulmonary Disease

## 2024-07-07 VITALS — Wt 172.8 lb

## 2024-07-07 DIAGNOSIS — J9611 Chronic respiratory failure with hypoxia: Secondary | ICD-10-CM

## 2024-07-07 DIAGNOSIS — J432 Centrilobular emphysema: Secondary | ICD-10-CM

## 2024-07-07 NOTE — Progress Notes (Signed)
 Daily Session Note  Patient Details  Name: Christine Barry MRN: [id number] Date of Birth: 20-Aug-1941 Referring Provider:   [name] Pulmonary Rehab Walk Test from 04/15/2024 in [facility] for Heart, Vascular, & Lung Health  Referring Provider McQuaid    Encounter Date: 07/07/2024  Check In:  Session Check In - 07/07/24 1328       Check-In   Supervising physician immediately available to respond to emergencies [facility] MD immediately available    Physician(s) Rosaline Bane, NP    [facility]    Staff Present Augustin Sharps, Candia Levin, RN, Maud Moats, MS, ACSM-CEP, Exercise Physiologist    Virtual Visit No    Medication changes reported     No    Fall or balance concerns reported    No    Tobacco Cessation No Change    Warm-up and Cool-down Performed as group-led instruction    Resistance Training Performed Yes    VAD Patient? No    PAD/SET Patient? No      Pain Assessment   Currently in Pain? No/denies    Multiple Pain Sites No          Capillary Blood Glucose: No results found for this or any previous visit (from the past 24 hours).   Exercise Prescription Changes - 07/07/24 1400       Response to Exercise   Blood Pressure (Admit) 126/70    Blood Pressure (Exercise) 138/70    Blood Pressure (Exit) 124/70    Heart Rate (Admit) 96 bpm    Heart Rate (Exercise) 118 bpm    Heart Rate (Exit) 105 bpm    Oxygen  Saturation (Admit) 91 %   RA   Oxygen  Saturation (Exercise) 89 %   3L   Oxygen  Saturation (Exit) 94 %   RA   Rating of Perceived Exertion (Exercise) 13    Perceived Dyspnea (Exercise) 3    Duration Continue with 30 min of aerobic exercise without signs/symptoms of physical distress.    Intensity THRR unchanged      Progression   Progression Continue to progress workloads to maintain intensity without signs/symptoms of physical distress.      Resistance Training   Training Prescription Yes     Weight blue bands    Reps 10-15    Time 10 Minutes      Oxygen    Oxygen  Continuous    Liters 2-4      Treadmill   MPH 1.9    Grade 1.5    Minutes 15    METs 2.5      NuStep   Level 6    SPM 58    Minutes 15    METs 2.9      Oxygen    Maintain Oxygen  Saturation 88% or higher          Social History   Tobacco Use  Smoking Status Former   Current packs/day: 0.00   Average packs/day: 2.0 packs/day for 38.7 years (77.5 ttl pk-yrs)   Types: Cigarettes   Start date: 10/08/1961   Quit date: 07/07/2000   Years since quitting: 24.0  Smokeless Tobacco Never    Goals Met:  Independence with exercise equipment Exercise tolerated well No report of concerns or symptoms today Strength training completed today  Goals Unmet:  Not Applicable  Comments: Service time is from 1316 to 1433    Dr. Slater Staff is Medical Director for Pulmonary Rehab at [facility].

## 2024-07-08 NOTE — Progress Notes (Addendum)
 Pulmonary Individual Treatment Plan  Patient Details  Name: Christine Barry MRN: [id number] Date of Birth: 09-14-1941 Referring Provider:   [name] Pulmonary Rehab Walk Test from 04/15/2024 in [facility]  Referring Provider McQuaid    Initial Encounter Date:  Flowsheet Row Pulmonary Rehab Walk Test from 04/15/2024 in [facility]  Date 04/15/24    Visit Diagnosis: Chronic respiratory failure with hypoxia (HCC)  Centrilobular emphysema (HCC)  Patient's Home Medications on Admission:   Current Outpatient Medications:    albuterol  (VENTOLIN  HFA) 108 (90 Base) MCG/ACT inhaler, Inhale 2 puffs into the lungs every 6 (six) hours as needed for wheezing or shortness of breath., Disp: 8 g, Rfl: 1   ALPRAZolam  (XANAX ) 0.25 MG tablet, Take 1 tablet (0.25 mg total) by mouth at bedtime as needed for anxiety., Disp: 15 tablet, Rfl: 0   cetirizine (ZYRTEC) 10 MG tablet, Take 10 mg by mouth daily as needed., Disp: , Rfl:    Cholecalciferol 50 MCG (2000 UT) TABS, Take 2,000 Units by mouth daily., Disp: , Rfl:    cyanocobalamin  1000 MCG tablet, Take 1,000 mcg by mouth 3 (three) times a week., Disp: , Rfl:    Ferrous Sulfate (IRON PO), Take by mouth. Nature made, Disp: , Rfl:    finasteride (PROSCAR) 5 MG tablet, Take 5 mg by mouth daily., Disp: , Rfl:    furosemide  (LASIX ) 20 MG tablet, Take 20 mg by mouth., Disp: , Rfl:    ipratropium (ATROVENT ) 0.03 % nasal spray, Place 1 spray into the nose 4 (four) times daily as needed., Disp: , Rfl:    levothyroxine  (SYNTHROID ) 200 MCG tablet, Take 1 tablet (200 mcg total) by mouth daily before breakfast. BRAND NAME ONLY, Disp: 90 tablet, Rfl: 1   meloxicam  (MOBIC ) 15 MG tablet, Take 1 tablet (15 mg total) by mouth daily., Disp: 90 tablet, Rfl: 3   minoxidil (LONITEN) 2.5 MG tablet, Take 1.25 mg by mouth daily., Disp: , Rfl:    montelukast   (SINGULAIR ) 10 MG tablet, TAKE 1 TABLET BY MOUTH AT BEDTIME, Disp: 90 tablet, Rfl: 1   simvastatin  (ZOCOR ) 40 MG tablet, TAKE 1 TABLET BY MOUTH ONCE DAILY AT BEDTIME, Disp: 30 tablet, Rfl: 2   TRELEGY ELLIPTA  200-62.5-25 MCG/ACT AEPB, Inhale 1 puff into the lungs., Disp: , Rfl:   Past Medical History: Past Medical History:  Diagnosis Date   Allergy 1964   Anxiety 1986   Arthritis    Asthma    COPD (chronic obstructive pulmonary disease) (HCC)    Emphysema of lung (HCC) 2016   Hyperlipidemia    Thyroid  disease     Tobacco Use: Social History   Tobacco Use  Smoking Status Former   Current packs/day: 0.00   Average packs/day: 2.0 packs/day for 38.7 years (77.5 ttl pk-yrs)   Types: Cigarettes   Start date: 10/08/1961   Quit date: 07/07/2000   Years since quitting: 24.0  Smokeless Tobacco Never    Labs: Review Flowsheet  More data may exist      Latest Ref Rng & Units 06/08/2021 07/20/2022 02/07/2023 07/16/2023 02/10/2024  Labs for ITP Cardiac and Pulmonary Rehab  Cholestrol <200 mg/dL 793  766  807  - -  LDL (calc) mg/dL (calc) 893  876  93  - -  HDL-C > OR = 50 mg/dL 72  82  70  - -  Trlycerides <150 mg/dL 831  160  202  - -  Hemoglobin A1c 4.0 - 5.6 % - - 6.0  6.0  5.5     Capillary Blood Glucose: No results found for: GLUCAP   Pulmonary Assessment Scores:  Pulmonary Assessment Scores     Row Name 04/15/24 0952 07/02/24 1531       ADL UCSD   ADL Phase Entry Exit    SOB Score total 34 37      CAT Score   CAT Score 15 16      mMRC Score   mMRC Score 3 2      UCSD: Self-administered rating of dyspnea associated with activities of daily living (ADLs) 6-point scale (0 = not at all to 5 = maximal or unable to do because of breathlessness)  Scoring Scores range from 0 to 120.  Minimally important difference is 5 units  CAT: CAT can identify the health impairment of COPD patients and is better correlated with disease progression.  CAT has a scoring range of  zero to 40. The CAT score is classified into four groups of low (less than 10), medium (10 - 20), high (21-30) and very high (31-40) based on the impact level of disease on health status. A CAT score over 10 suggests significant symptoms.  A worsening CAT score could be explained by an exacerbation, poor medication adherence, poor inhaler technique, or progression of COPD or comorbid conditions.  CAT MCID is 2 points  mMRC: mMRC (Modified Medical Research Council) Dyspnea Scale is used to assess the degree of baseline functional disability in patients of respiratory disease due to dyspnea. No minimal important difference is established. A decrease in score of 1 point or greater is considered a positive change.   Pulmonary Function Assessment:  Pulmonary Function Assessment - 04/15/24 0955       Breath   Bilateral Breath Sounds Clear    Shortness of Breath Yes;Limiting activity          Exercise Target Goals: Exercise Program Goal: Individual exercise prescription set using results from initial 6 min walk test and THRR while considering  patient's activity barriers and safety.   Exercise Prescription Goal: Initial exercise prescription builds to 30-45 minutes a day of aerobic activity, 2-3 days per week.  Home exercise guidelines will be given to patient during program as part of exercise prescription that the participant will acknowledge.  Activity Barriers & Risk Stratification:  Activity Barriers & Cardiac Risk Stratification - 04/15/24 1104       Activity Barriers & Cardiac Risk Stratification   Activity Barriers Arthritis;Deconditioning;Muscular Weakness;Shortness of Breath          6 Minute Walk:  6 Minute Walk     Row Name 04/15/24 1056 07/02/24 1535       6 Minute Walk   Phase Initial Discharge    Distance 985 feet 1200 feet    Distance % Change -- 21.38 %    Distance Feet Change -- 215 ft    Walk Time 6 minutes 6 minutes    # of Rest Breaks 1  2:40-2:50 0     MPH 1.87 2.18    METS 1.93 2.44    RPE 11 13    Perceived Dyspnea  1 1    VO2 Peak 6.75 8.54    Symptoms No Yes (comment)    Comments -- DOE    Resting HR 89 bpm 95 bpm    Resting BP 124/58 130/60    Resting Oxygen  Saturation  92 % 93 %    Exercise Oxygen  Saturation  during 6 min walk 87 % 87 %    Max Ex. HR 105 bpm 122 bpm    Max Ex. BP 160/80 160/60    2 Minute Post BP 120/80 130/76      Interval HR   1 Minute HR 90 104    2 Minute HR 101 108    3 Minute HR 90 114    4 Minute HR 93 116    5 Minute HR 100 119    6 Minute HR 105 122    2 Minute Post HR 86 105    Interval Heart Rate? Yes --      Interval Oxygen    Interval Oxygen ? Yes --    Baseline Oxygen  Saturation % 92 % 93 %    1 Minute Oxygen  Saturation % 93 % 93 %    1 Minute Liters of Oxygen  0 L 2 L    2 Minute Oxygen  Saturation % 87 % 90 %    2 Minute Liters of Oxygen  0 L  increased to 1L 2 L    3 Minute Oxygen  Saturation % 89 %  87% at 2:40 87 %    3 Minute Liters of Oxygen  1 L  increased to 2L 2 L    4 Minute Oxygen  Saturation % 94 % 88 %    4 Minute Liters of Oxygen  2 L 3 L    5 Minute Oxygen  Saturation % 93 % 89 %    5 Minute Liters of Oxygen  2 L 3 L    6 Minute Oxygen  Saturation % 90 % 87 %    6 Minute Liters of Oxygen  2 L 3 L    2 Minute Post Oxygen  Saturation % 95 % 96 %    2 Minute Post Liters of Oxygen  2 L 3 L       Oxygen  Initial Assessment:  Oxygen  Initial Assessment - 04/15/24 0954       Home Oxygen    Home Oxygen  Device None    Sleep Oxygen  Prescription None    Home Exercise Oxygen  Prescription None    Home Resting Oxygen  Prescription None      Initial 6 min Walk   Oxygen  Used Continuous    Liters per minute 2      Program Oxygen  Prescription   Program Oxygen  Prescription Continuous    Liters per minute 2      Intervention   Short Term Goals To learn and exhibit compliance with exercise, home and travel O2 prescription;To learn and understand importance of maintaining oxygen   saturations>88%;To learn and demonstrate proper use of respiratory medications;To learn and understand importance of monitoring SPO2 with pulse oximeter and demonstrate accurate use of the pulse oximeter.;To learn and demonstrate proper pursed lip breathing techniques or other breathing techniques.     Long  Term Goals Exhibits compliance with exercise, home  and travel O2 prescription;Verbalizes importance of monitoring SPO2 with pulse oximeter and return demonstration;Maintenance of O2 saturations>88%;Exhibits proper breathing techniques, such as pursed lip breathing or other method taught during program session;Compliance with respiratory medication;Demonstrates proper use of MDI's          Oxygen  Re-Evaluation:  Oxygen  Re-Evaluation     Row Name 04/15/24 0954 05/07/24 0856 06/02/24 0846 06/30/24 0829       Program Oxygen  Prescription   Program Oxygen  Prescription -- Continuous Continuous Continuous    Liters per minute -- 3 3 3  Comments -- consistently using 3L for exercise has had one day that she needed 4L 3-4L. She recently attempted to exercise on her POC, setting 4, but it was not enough for her O2 needs      Home Oxygen    Home Oxygen  Device -- None None None    Sleep Oxygen  Prescription -- None None None    Home Exercise Oxygen  Prescription -- None None None    Home Resting Oxygen  Prescription -- None None None      Goals/Expected Outcomes   Short Term Goals -- To learn and exhibit compliance with exercise, home and travel O2 prescription;To learn and understand importance of maintaining oxygen  saturations>88%;To learn and demonstrate proper use of respiratory medications;To learn and understand importance of monitoring SPO2 with pulse oximeter and demonstrate accurate use of the pulse oximeter.;To learn and demonstrate proper pursed lip breathing techniques or other breathing techniques.  To learn and exhibit compliance with exercise, home and travel O2 prescription;To learn  and understand importance of maintaining oxygen  saturations>88%;To learn and demonstrate proper use of respiratory medications;To learn and understand importance of monitoring SPO2 with pulse oximeter and demonstrate accurate use of the pulse oximeter.;To learn and demonstrate proper pursed lip breathing techniques or other breathing techniques.  To learn and exhibit compliance with exercise, home and travel O2 prescription;To learn and understand importance of maintaining oxygen  saturations>88%;To learn and demonstrate proper use of respiratory medications;To learn and understand importance of monitoring SPO2 with pulse oximeter and demonstrate accurate use of the pulse oximeter.;To learn and demonstrate proper pursed lip breathing techniques or other breathing techniques.     Long  Term Goals -- Exhibits compliance with exercise, home  and travel O2 prescription;Verbalizes importance of monitoring SPO2 with pulse oximeter and return demonstration;Maintenance of O2 saturations>88%;Exhibits proper breathing techniques, such as pursed lip breathing or other method taught during program session;Compliance with respiratory medication;Demonstrates proper use of MDI's Exhibits compliance with exercise, home  and travel O2 prescription;Verbalizes importance of monitoring SPO2 with pulse oximeter and return demonstration;Maintenance of O2 saturations>88%;Exhibits proper breathing techniques, such as pursed lip breathing or other method taught during program session;Compliance with respiratory medication;Demonstrates proper use of MDI's Exhibits compliance with exercise, home  and travel O2 prescription;Verbalizes importance of monitoring SPO2 with pulse oximeter and return demonstration;Maintenance of O2 saturations>88%;Exhibits proper breathing techniques, such as pursed lip breathing or other method taught during program session;Compliance with respiratory medication;Demonstrates proper use of MDI's    Comments -- 3L  -- --    Goals/Expected Outcomes Compliance and understanding of oxygen  saturation monitoring and breathing techniques to decrease shortness of breath. Compliance and understanding of oxygen  saturation monitoring and breathing techniques to decrease shortness of breath. Compliance and understanding of oxygen  saturation monitoring and breathing techniques to decrease shortness of breath. Compliance and understanding of oxygen  saturation monitoring and breathing techniques to decrease shortness of breath.       Oxygen  Discharge (Final Oxygen  Re-Evaluation):  Oxygen  Re-Evaluation - 06/30/24 0829       Program Oxygen  Prescription   Program Oxygen  Prescription Continuous    Liters per minute 3    Comments 3-4L. She recently attempted to exercise on her POC, setting 4, but it was not enough for her O2 needs      Home Oxygen    Home Oxygen  Device None    Sleep Oxygen  Prescription None    Home Exercise Oxygen  Prescription None    Home Resting Oxygen  Prescription None      Goals/Expected Outcomes   Short  Term Goals To learn and exhibit compliance with exercise, home and travel O2 prescription;To learn and understand importance of maintaining oxygen  saturations>88%;To learn and demonstrate proper use of respiratory medications;To learn and understand importance of monitoring SPO2 with pulse oximeter and demonstrate accurate use of the pulse oximeter.;To learn and demonstrate proper pursed lip breathing techniques or other breathing techniques.     Long  Term Goals Exhibits compliance with exercise, home  and travel O2 prescription;Verbalizes importance of monitoring SPO2 with pulse oximeter and return demonstration;Maintenance of O2 saturations>88%;Exhibits proper breathing techniques, such as pursed lip breathing or other method taught during program session;Compliance with respiratory medication;Demonstrates proper use of MDI's    Goals/Expected Outcomes Compliance and understanding of oxygen   saturation monitoring and breathing techniques to decrease shortness of breath.          Initial Exercise Prescription:  Initial Exercise Prescription - 04/15/24 1000       Date of Initial Exercise RX and Referring Provider   Date 04/15/24    Referring Provider McQuaid    Expected Discharge Date 07/09/24      Oxygen    Oxygen  Continuous    Liters 2    Maintain Oxygen  Saturation 88% or higher      Treadmill   MPH 1.5    Grade 0    Minutes 15    METs 2      NuStep   Level 1    SPM 60    Minutes 15    METs 1.5      Prescription Details   Frequency (times per week) 2    Duration Progress to 30 minutes of continuous aerobic without signs/symptoms of physical distress      Intensity   THRR 40-80% of Max Heartrate 56-11    Ratings of Perceived Exertion 11-13    Perceived Dyspnea 0-4      Progression   Progression Continue to progress workloads to maintain intensity without signs/symptoms of physical distress.      Resistance Training   Training Prescription Yes    Weight red bands    Reps 10-15          Perform Capillary Blood Glucose checks as needed.  Exercise Prescription Changes:   Exercise Prescription Changes     Row Name 04/28/24 1500 05/12/24 1500 05/26/24 1500 06/09/24 1500 06/23/24 1500     Response to Exercise   Blood Pressure (Admit) 118/70 118/64 118/70 130/68 132/80   Blood Pressure (Exercise) 120/84 130/80 142/74 138/70 156/80   Blood Pressure (Exit) 116/66 122/70 98/70 132/68 122/70   Heart Rate (Admit) 90 bpm 81 bpm 78 bpm 88 bpm 98 bpm   Heart Rate (Exercise) 104 bpm 110 bpm 116 bpm 120 bpm 125 bpm   Heart Rate (Exit) 91 bpm 91 bpm 92 bpm 92 bpm 96 bpm   Oxygen  Saturation (Admit) 96 % 97 % 97 % 88 %  RA 96 %  2L   Oxygen  Saturation (Exercise) 91 % 90 % 90 % 89 %  3L 89 %  4L   Oxygen  Saturation (Exit) 93 % 92 % 95 % 92 %  RA 90 %  RA   Rating of Perceived Exertion (Exercise) 12 13 12 14 14    Perceived Dyspnea (Exercise) 2 2 3 2 2     Duration Continue with 30 min of aerobic exercise without signs/symptoms of physical distress. Continue with 30 min of aerobic exercise without signs/symptoms of physical distress. Continue with 30 min of aerobic exercise without signs/symptoms of  physical distress. Continue with 30 min of aerobic exercise without signs/symptoms of physical distress. Continue with 30 min of aerobic exercise without signs/symptoms of physical distress.   Intensity THRR unchanged THRR unchanged THRR unchanged THRR unchanged THRR unchanged     Progression   Progression Continue to progress workloads to maintain intensity without signs/symptoms of physical distress. Continue to progress workloads to maintain intensity without signs/symptoms of physical distress. Continue to progress workloads to maintain intensity without signs/symptoms of physical distress. Continue to progress workloads to maintain intensity without signs/symptoms of physical distress. Continue to progress workloads to maintain intensity without signs/symptoms of physical distress.     Resistance Training   Training Prescription Yes Yes Yes Yes Yes   Weight red bands red bands red bands red bands red bands   Reps 10-15 10-15 10-15 10-15 10-15   Time 10 Minutes 10 Minutes 10 Minutes 10 Minutes 10 Minutes     Oxygen    Oxygen  Continuous Continuous Continuous Continuous Continuous   Liters 3 3 3-4 2-3 2-4     Treadmill   MPH 1.5 2 1.9 1.9 1.9   Grade 1 1 1.5 1.5 1.5   Minutes 15 15 15 15 15    METs 2.2 2.6 2.85 2.5 2.5     NuStep   Level 2 4 5 5 6    SPM -- 70 70 72 62   Minutes 15 15 15 15 15    METs 2.2 2.5 2.8 3 3      Oxygen    Maintain Oxygen  Saturation 88% or higher 88% or higher 88% or higher 88% or higher 88% or higher    Row Name 06/23/24 1547 07/07/24 1400           Response to Exercise   Blood Pressure (Admit) -- 126/70      Blood Pressure (Exercise) -- 138/70      Blood Pressure (Exit) -- 124/70      Heart Rate (Admit) -- 96  bpm      Heart Rate (Exercise) -- 118 bpm      Heart Rate (Exit) -- 105 bpm      Oxygen  Saturation (Admit) -- 91 %  RA      Oxygen  Saturation (Exercise) -- 89 %  3L      Oxygen  Saturation (Exit) -- 94 %  RA      Rating of Perceived Exertion (Exercise) -- 13      Perceived Dyspnea (Exercise) -- 3      Duration -- Continue with 30 min of aerobic exercise without signs/symptoms of physical distress.      Intensity -- THRR unchanged        Progression   Progression -- Continue to progress workloads to maintain intensity without signs/symptoms of physical distress.        Resistance Training   Training Prescription -- Yes      Weight -- blue bands      Reps -- 10-15      Time -- 10 Minutes        Oxygen    Oxygen  -- Continuous      Liters -- 2-4        Treadmill   MPH -- 1.9      Grade -- 1.5      Minutes -- 15      METs -- 2.5        NuStep   Level -- 6      SPM -- 58      Minutes -- 15  METs -- 2.9        Home Exercise Plan   Plans to continue exercise at Home (comment) --      Frequency Add 1 additional day to program exercise sessions. --      Initial Home Exercises Provided 06/23/24 --        Oxygen    Maintain Oxygen  Saturation -- 88% or higher         Exercise Comments:   Exercise Comments     Row Name 04/21/24 1205 06/23/24 1543         Exercise Comments Pt completed first day of group exercise. She walked on the treadmill 1.5 mph, 0 incline for 15 min, METs 2.15. She then exercised on the recumbent stepper for 15 min, level 2, METs 2.2. Pt tolerated well. Performed warm up and cool down without limitations, including squats. Discussed METs with good reception. Discussed with pt home exercise plan. She is currently doing the warm up and cool down exercises. Encouraged her to try walking in her house 15 min x2 or 30 min. She also could go to the gym and exercise on the recumbent stepper if she feels comfortable. Encouraged pt to add resistance or speed as she  is able.         Exercise Goals and Review:   Exercise Goals     Row Name 04/15/24 1105             Exercise Goals   Increase Physical Activity Yes       Intervention Provide advice, education, support and counseling about physical activity/exercise needs.;Develop an individualized exercise prescription for aerobic and resistive training based on initial evaluation findings, risk stratification, comorbidities and participant's personal goals.       Expected Outcomes Short Term: Attend rehab on a regular basis to increase amount of physical activity.;Long Term: Add in home exercise to make exercise part of routine and to increase amount of physical activity.;Long Term: Exercising regularly at least 3-5 days a week.       Increase Strength and Stamina Yes       Intervention Provide advice, education, support and counseling about physical activity/exercise needs.;Develop an individualized exercise prescription for aerobic and resistive training based on initial evaluation findings, risk stratification, comorbidities and participant's personal goals.       Expected Outcomes Short Term: Increase workloads from initial exercise prescription for resistance, speed, and METs.;Short Term: Perform resistance training exercises routinely during rehab and add in resistance training at home;Long Term: Improve cardiorespiratory fitness, muscular endurance and strength as measured by increased METs and functional capacity ( )       Able to understand and use rate of perceived exertion (RPE) scale Yes       Intervention Provide education and explanation on how to use RPE scale       Expected Outcomes Short Term: Able to use RPE daily in rehab to express subjective intensity level;Long Term:  Able to use RPE to guide intensity level when exercising independently       Able to understand and use Dyspnea scale Yes       Intervention Provide education and explanation on how to use Dyspnea scale        Expected Outcomes Short Term: Able to use Dyspnea scale daily in rehab to express subjective sense of shortness of breath during exertion;Long Term: Able to use Dyspnea scale to guide intensity level when exercising independently       Knowledge and understanding of Target  Heart Rate Range (THRR) Yes       Intervention Provide education and explanation of THRR including how the numbers were predicted and where they are located for reference       Expected Outcomes Short Term: Able to state/look up THRR;Long Term: Able to use THRR to govern intensity when exercising independently;Short Term: Able to use daily as guideline for intensity in rehab       Understanding of Exercise Prescription Yes       Intervention Provide education, explanation, and written materials on patient's individual exercise prescription       Expected Outcomes Short Term: Able to explain program exercise prescription;Long Term: Able to explain home exercise prescription to exercise independently          Exercise Goals Re-Evaluation :  Exercise Goals Re-Evaluation     Row Name 04/15/24 1105 05/07/24 0854 06/02/24 0848 06/30/24 0820       Exercise Goal Re-Evaluation   Exercise Goals Review Increase Physical Activity;Able to understand and use Dyspnea scale;Understanding of Exercise Prescription;Increase Strength and Stamina;Knowledge and understanding of Target Heart Rate Range (THRR);Able to understand and use rate of perceived exertion (RPE) scale Increase Physical Activity;Able to understand and use Dyspnea scale;Understanding of Exercise Prescription;Increase Strength and Stamina;Knowledge and understanding of Target Heart Rate Range (THRR);Able to understand and use rate of perceived exertion (RPE) scale Increase Physical Activity;Able to understand and use Dyspnea scale;Understanding of Exercise Prescription;Increase Strength and Stamina;Knowledge and understanding of Target Heart Rate Range (THRR);Able to understand and  use rate of perceived exertion (RPE) scale Increase Physical Activity;Able to understand and use Dyspnea scale;Understanding of Exercise Prescription;Increase Strength and Stamina;Knowledge and understanding of Target Heart Rate Range (THRR);Able to understand and use rate of perceived exertion (RPE) scale    Comments Christine Barry is scheduled to begin exercise on 7/15. Will continue to monitor and progress as able. Christine Barry has completed 5 exercise sessions with good attendance. She is walking on the treadmill for 15 min, 1.5 mph, 1% incline, METs 2.2. She then is exercising on the recumbent stepper for 15 min, level 3 now, METs 2.2. She performs warm up and cool down unlimited, using red bands. Will progress to blue, 5.8 lbs. Will continue to monitor and progress as able. Christine Barry has completed 12 exercise sessions with good attendance. She is walking on the treadmill for 15 min, 1.7 mph, 1.5% incline, METs 2.65. She then is exercising on the recumbent stepper for 15 min, level 5 now, METs 2.8. She performs warm up and cool down unlimited, using blue bands, 5.8 lbs. Will continue to monitor and progress as able. She is motivated. Christine Barry has completed 20 exercise sessions with good attendance. She is walking on the treadmill for 15 min, 1.9 mph, 1.5% incline, METs 2.65. She then is exercising on the recumbent stepper for 15 min, level 6, METs 2.9-3. She performs warm up and cool down unlimited, using blue bands, 5.8 lbs. Will continue to monitor and progress as able. She is set to graduate 10/7 and plans to exercise at the [facility] if she has enough O2.    Expected Outcomes i. Through exercise at rehab and home, the patient will decrease shortness of breath with daily activities and feel confident in carrying out an exercise regimen at home. i. Through exercise at rehab and home, the patient will decrease shortness of breath with daily activities and feel confident in carrying out an exercise regimen at home. Through exercise at  rehab and home, the patient will decrease  shortness of breath with daily activities and feel confident in carrying out an exercise regimen at home. Through exercise at rehab and home, the patient will decrease shortness of breath with daily activities and feel confident in carrying out an exercise regimen at home.       Discharge Exercise Prescription (Final Exercise Prescription Changes):  Exercise Prescription Changes - 07/07/24 1400       Response to Exercise   Blood Pressure (Admit) 126/70    Blood Pressure (Exercise) 138/70    Blood Pressure (Exit) 124/70    Heart Rate (Admit) 96 bpm    Heart Rate (Exercise) 118 bpm    Heart Rate (Exit) 105 bpm    Oxygen  Saturation (Admit) 91 %   RA   Oxygen  Saturation (Exercise) 89 %   3L   Oxygen  Saturation (Exit) 94 %   RA   Rating of Perceived Exertion (Exercise) 13    Perceived Dyspnea (Exercise) 3    Duration Continue with 30 min of aerobic exercise without signs/symptoms of physical distress.    Intensity THRR unchanged      Progression   Progression Continue to progress workloads to maintain intensity without signs/symptoms of physical distress.      Resistance Training   Training Prescription Yes    Weight blue bands    Reps 10-15    Time 10 Minutes      Oxygen    Oxygen  Continuous    Liters 2-4      Treadmill   MPH 1.9    Grade 1.5    Minutes 15    METs 2.5      NuStep   Level 6    SPM 58    Minutes 15    METs 2.9      Oxygen    Maintain Oxygen  Saturation 88% or higher          Nutrition:  Target Goals: Understanding of nutrition guidelines, daily intake of sodium 1500mg , cholesterol 200mg , calories 30% from fat and 7% or less from saturated fats, daily to have 5 or more servings of fruits and vegetables.  Biometrics:  Pre Biometrics - 04/15/24 0919       Pre Biometrics   Grip Strength 10 kg           Nutrition Therapy Plan and Nutrition Goals:  Nutrition Therapy & Goals - 05/21/24 1438        Nutrition Therapy   Diet General Healthy Diet    Drug/Food Interactions Statins/Certain Fruits      Personal Nutrition Goals   Nutrition Goal Patient to continue diet quality by using the plate method as a guide for meal planning to include lean protein/plant protein, fruits, vegetables, whole grains, nonfat dairy as part of a well-balanced diet.    Comments Goal in progress. Christine Barry has medical history of COPD. high cholesterol. A1c and lipids are well controlled. BMI is appropriate for age. She has maintained her weight since starting with our program.  Patient will benefit from participation in pulmonary rehab for nutrition education, exercise, and lifestyle modification.      Intervention Plan   Intervention Prescribe, educate and counsel regarding individualized specific dietary modifications aiming towards targeted core components such as weight, hypertension, lipid management, diabetes, heart failure and other comorbidities.;Nutrition handout(s) given to patient.    Expected Outcomes Short Term Goal: Understand basic principles of dietary content, such as calories, fat, sodium, cholesterol and nutrients.;Long Term Goal: Adherence to prescribed nutrition plan.  Nutrition Assessments:  Nutrition Assessments - 07/02/24 1533       Rate Your Plate Scores   Post Score 67         MEDIFICTS Score Key: >=70 Need to make dietary changes  40-70 Heart Healthy Diet <= 40 Therapeutic Level Cholesterol Diet  Flowsheet Row PULMONARY REHAB OTHER RESPIRATORY from 07/02/2024 in [facility] for Heart, Vascular, & Lung Health  Picture Your Plate Total Score on Discharge 67   Picture Your Plate Scores: <59 Unhealthy dietary pattern with much room for improvement. 41-50 Dietary pattern unlikely to meet recommendations for good health and room for improvement. 51-60 More healthful dietary pattern, with some room for improvement.  >60 Healthy dietary pattern, although  there may be some specific behaviors that could be improved.    Nutrition Goals Re-Evaluation:  Nutrition Goals Re-Evaluation     Row Name 04/21/24 1040 05/21/24 1438           Goals   Current Weight 175 lb 7.8 oz (79.6 kg) 175 lb 0.7 oz (79.4 kg)      Comment A1c WNL, triglyceride 202, LDL 93, HDL 70 A1c WNL, triglyceride 202, LDL 93, HDL 70      Expected Outcome Christine Barry has medical history of COPD. high cholesterol. A1c and lipids are well controlled. She follows a regular diet. BMI is appropriate for age. Patient will benefit from participation in pulmonary rehab for nutrition education, exercise, and lifestyle modification. Goal in progress. Christine Barry has medical history of COPD. high cholesterol. A1c and lipids are well controlled. BMI is appropriate for age. She has maintained her weight since starting with our program. Patient will benefit from participation in pulmonary rehab for nutrition education, exercise, and lifestyle modification.         Nutrition Goals Discharge (Final Nutrition Goals Re-Evaluation):  Nutrition Goals Re-Evaluation - 05/21/24 1438       Goals   Current Weight 175 lb 0.7 oz (79.4 kg)    Comment A1c WNL, triglyceride 202, LDL 93, HDL 70    Expected Outcome Goal in progress. Christine Barry has medical history of COPD. high cholesterol. A1c and lipids are well controlled. BMI is appropriate for age. She has maintained her weight since starting with our program. Patient will benefit from participation in pulmonary rehab for nutrition education, exercise, and lifestyle modification.          Psychosocial: Target Goals: Acknowledge presence or absence of significant depression and/or stress, maximize coping skills, provide positive support system. Participant is able to verbalize types and ability to use techniques and skills needed for reducing stress and depression.  Initial Review & Psychosocial Screening:  Initial Psych Review & Screening - 04/15/24 0933        Initial Review   Current issues with Current Psychotropic Meds;Current Stress Concerns    Source of Stress Concerns Chronic Illness    Comments Pt is worried and concerned about her 2 grandchildren that have been through alot in the past years. Losing their father and their only uncle and moving from [place] to [place]. Pt states she has to live long and be there for them and her daughter      Family Dynamics   Good Support System? Yes      Barriers   Psychosocial barriers to participate in program Psychosocial barriers identified (see note)      Screening Interventions   Interventions Encouraged to exercise;To provide support and resources with identified psychosocial needs;Provide feedback about the scores to  participant    Expected Outcomes Short Term goal: Utilizing psychosocial counselor, staff and physician to assist with identification of specific Stressors or current issues interfering with healing process. Setting desired goal for each stressor or current issue identified.;Long Term Goal: Stressors or current issues are controlled or eliminated.;Short Term goal: Identification and review with participant of any Quality of Life or Depression concerns found by scoring the questionnaire.;Long Term goal: The participant improves quality of Life and PHQ9 Scores as seen by post scores and/or verbalization of changes          Quality of Life Scores:  Scores of 19 and below usually indicate a poorer quality of life in these areas.  A difference of  2-3 points is a clinically meaningful difference.  A difference of 2-3 points in the total score of the Quality of Life Index has been associated with significant improvement in overall quality of life, self-image, physical symptoms, and general health in studies assessing change in quality of life.  PHQ-9: Review Flowsheet  More data exists      07/02/2024 04/21/2024 04/15/2024 05/14/2023 04/09/2023  Depression screen PHQ 2/9  Decreased Interest 0 0 0 0 0   Down, Depressed, Hopeless 0 0 1 0 1  PHQ - 2 Score 0 0 1 0 1  Altered sleeping 0 - 0 - -  Tired, decreased energy 0 - 0 - -  Change in appetite 0 - 0 - -  Feeling bad or failure about yourself  0 - 0 - -  Trouble concentrating 3 - 1 - -  Moving slowly or fidgety/restless 0 - 0 - -  Suicidal thoughts 0 - 0 - -  PHQ-9 Score 3 - 2 - -  Difficult doing work/chores Not difficult at all - Not difficult at all - -   Interpretation of Total Score  Total Score Depression Severity:  1-4 = Minimal depression, 5-9 = Mild depression, 10-14 = Moderate depression, 15-19 = Moderately severe depression, 20-27 = Severe depression   Psychosocial Evaluation and Intervention:  Psychosocial Evaluation - 04/15/24 0937       Psychosocial Evaluation & Interventions   Interventions Relaxation education;Encouraged to exercise with the program and follow exercise prescription    Comments Pt is worried and concerned about her 2 grandchildren that have been through alot in the past years. Losing their father and their only uncle and moving from [place] to [place]. Pt states she has to live long and be there for them and her daughter    Expected Outcomes For Christine Barry to participate in PR without any psy/soc barriers or concerns    Continue Psychosocial Services  Follow up required by staff          Psychosocial Re-Evaluation:  Psychosocial Re-Evaluation     Row Name 05/08/24 1405 06/03/24 1656 06/26/24 1310         Psychosocial Re-Evaluation   Current issues with Current Psychotropic Meds;Current Stress Concerns Current Psychotropic Meds;Current Stress Concerns Current Psychotropic Meds;Current Stress Concerns     Comments Monthly psychosocial re-evaluation as follows: Christine Barry still worries about her 2 grandchildren after losing their father and uncle. She sees them weekly and assists her daughter out in caring for them during the week. She states they are not happy to be returning to school after being out for the  summer. She declines any additional needs or resources at the time. Monthly psychosocial re-evaluation as follows: No changes since previous assessment. Christine Barry still worries about her 2 grandchildren after losing  their father and uncle. She sees them weekly and assists her daughter in caring for them during the week. She has support from her family and friends. She declines any additional needs or resources at the time. Monthly psychosocial re-evaluation as follows: Monthly psychosocial re-evaluation as follows: No changes since previous assessment. She has support from her family and friends. She declines any additional needs or resources at the time.     Expected Outcomes For Christine Barry to attend pulmonary rehab and to have a positive outlook and good coping skills to manage her stress. To ask for resources or referrals if needed. For Christine Barry to attend pulmonary rehab and to have a positive outlook and good coping skills to manage her stress. To ask for resources or referrals if needed. For Christine Barry to attend pulmonary rehab and to have a positive outlook and good coping skills to manage her stress. To ask for resources or referrals if needed.     Interventions Stress management education;Encouraged to attend Pulmonary Rehabilitation for the exercise Encouraged to attend Pulmonary Rehabilitation for the exercise Encouraged to attend Pulmonary Rehabilitation for the exercise     Continue Psychosocial Services  No Follow up required Follow up required by staff Follow up required by staff        Psychosocial Discharge (Final Psychosocial Re-Evaluation):  Psychosocial Re-Evaluation - 06/26/24 1310       Psychosocial Re-Evaluation   Current issues with Current Psychotropic Meds;Current Stress Concerns    Comments Monthly psychosocial re-evaluation as follows: Monthly psychosocial re-evaluation as follows: No changes since previous assessment. She has support from her family and friends. She declines any additional  needs or resources at the time.    Expected Outcomes For Christine Barry to attend pulmonary rehab and to have a positive outlook and good coping skills to manage her stress. To ask for resources or referrals if needed.    Interventions Encouraged to attend Pulmonary Rehabilitation for the exercise    Continue Psychosocial Services  Follow up required by staff          Education: Education Goals: Education classes will be provided on a weekly basis, covering required topics. Participant will state understanding/return demonstration of topics presented.  Learning Barriers/Preferences:  Learning Barriers/Preferences - 04/15/24 9061       Learning Barriers/Preferences   Learning Barriers Sight    Learning Preferences None          Education Topics: Know Your Numbers Group instruction that is supported by a PowerPoint presentation. Instructor discusses importance of knowing and understanding resting, exercise, and post-exercise oxygen  saturation, heart rate, and blood pressure. Oxygen  saturation, heart rate, blood pressure, rating of perceived exertion, and dyspnea are reviewed along with a normal range for these values.  Flowsheet Row PULMONARY REHAB OTHER RESPIRATORY from 06/25/2024 in [facility] for Heart, Vascular, & Lung Health  Date 06/25/24  Educator EP  Instruction Review Code 1- Verbalizes Understanding    Exercise for the Pulmonary Patient Group instruction that is supported by a PowerPoint presentation. Instructor discusses benefits of exercise, core components of exercise, frequency, duration, and intensity of an exercise routine, importance of utilizing pulse oximetry during exercise, safety while exercising, and options of places to exercise outside of rehab.  Flowsheet Row PULMONARY REHAB OTHER RESPIRATORY from 06/18/2024 in [facility] for Heart, Vascular, & Lung Health  Date 06/18/24  Educator EP  Instruction Review Code 1-  Verbalizes Understanding    MET Level  Group instruction provided by PowerPoint,  verbal discussion, and written material to support subject matter. Instructor reviews what METs are and how to increase METs.  Flowsheet Row PULMONARY REHAB OTHER RESPIRATORY from 05/21/2024 in [facility] for Heart, Vascular, & Lung Health  Date 05/21/24  Educator EP  Instruction Review Code 1- Verbalizes Understanding    Pulmonary Medications Verbally interactive group education provided by instructor with focus on inhaled medications and proper administration. Flowsheet Row PULMONARY REHAB OTHER RESPIRATORY from 06/11/2024 in [facility] for Heart, Vascular, & Lung Health  Date 06/11/24  Educator RT  Instruction Review Code 1- Verbalizes Understanding    Anatomy and Physiology of the Respiratory System Group instruction provided by PowerPoint, verbal discussion, and written material to support subject matter. Instructor reviews respiratory cycle and anatomical components of the respiratory system and their functions. Instructor also reviews differences in obstructive and restrictive respiratory diseases with examples of each.  Flowsheet Row PULMONARY REHAB OTHER RESPIRATORY from 06/04/2024 in [facility] for Heart, Vascular, & Lung Health  Date 06/04/24  Educator RT  Instruction Review Code 1- Verbalizes Understanding    Oxygen  Safety Group instruction provided by PowerPoint, verbal discussion, and written material to support subject matter. There is an overview of "What is Oxygen " and "Why do we need it".  Instructor also reviews how to create a safe environment for oxygen  use, the importance of using oxygen  as prescribed, and the risks of noncompliance. There is a brief discussion on traveling with oxygen  and resources the patient may utilize. Flowsheet Row PULMONARY REHAB OTHER RESPIRATORY from 07/02/2024 in [facility] for Heart, Vascular, & Lung Health  Date 07/02/24  Educator RN  Instruction Review Code 1- Verbalizes Understanding    Oxygen  Use Group instruction provided by PowerPoint, verbal discussion, and written material to discuss how supplemental oxygen  is prescribed and different types of oxygen  supply systems. Resources for more information are provided.    Breathing Techniques Group instruction that is supported by demonstration and informational handouts. Instructor discusses the benefits of pursed lip and diaphragmatic breathing and detailed demonstration on how to perform both.  Flowsheet Row PULMONARY REHAB OTHER RESPIRATORY from 04/23/2024 in [facility] for Heart, Vascular, & Lung Health  Date 04/23/24  Educator RN  Instruction Review Code 1- Verbalizes Understanding     Risk Factor Reduction Group instruction that is supported by a PowerPoint presentation. Instructor discusses the definition of a risk factor, different risk factors for pulmonary disease, and how the heart and lungs work together.   Pulmonary Diseases Group instruction provided by PowerPoint, verbal discussion, and written material to support subject matter. Instructor gives an overview of the different type of pulmonary diseases. There is also a discussion on risk factors and symptoms as well as ways to manage the diseases. Flowsheet Row PULMONARY REHAB OTHER RESPIRATORY from 05/28/2024 in [facility] for Heart, Vascular, & Lung Health  Date 05/28/24  Educator RT  Instruction Review Code 1- Verbalizes Understanding    Stress and Energy Conservation Group instruction provided by PowerPoint, verbal discussion, and written material to support subject matter. Instructor gives an overview of stress and the impact it can have on the body. Instructor also reviews ways to reduce stress. There is also a discussion on energy conservation and ways to conserve energy  throughout the day. Flowsheet Row PULMONARY REHAB OTHER RESPIRATORY from 04/30/2024 in [facility] for Heart, Vascular, & Lung Health  Date 04/30/24  Educator RN  Instruction Review Code 1- Verbalizes Understanding    Warning Signs and Symptoms Group instruction provided by PowerPoint, verbal discussion, and written material to support subject matter. Instructor reviews warning signs and symptoms of stroke, heart attack, cold and flu. Instructor also reviews ways to prevent the spread of infection. Flowsheet Row PULMONARY REHAB OTHER RESPIRATORY from 05/07/2024 in [facility] for Heart, Vascular, & Lung Health  Date 05/07/24  Educator RN  Instruction Review Code 1- Verbalizes Understanding    Other Education Group or individual verbal, written, or video instructions that support the educational goals of the pulmonary rehab program.    Knowledge Questionnaire Score:  Knowledge Questionnaire Score - 07/02/24 1532       Knowledge Questionnaire Score   Post Score 18/18          Core Components/Risk Factors/Patient Goals at Admission:  Personal Goals and Risk Factors at Admission - 04/15/24 0938       Core Components/Risk Factors/Patient Goals on Admission    Weight Management Yes;Weight Loss    Intervention Weight Management/Obesity: Establish reasonable short term and long term weight goals.;Weight Management: Provide education and appropriate resources to help participant work on and attain dietary goals.;Obesity: Provide education and appropriate resources to help participant work on and attain dietary goals.;Weight Management: Develop a combined nutrition and exercise program designed to reach desired caloric intake, while maintaining appropriate intake of nutrient and fiber, sodium and fats, and appropriate energy expenditure required for the weight goal.    Admit Weight 175 lb 7.8 oz (79.6 kg)    Expected Outcomes Short Term:  Continue to assess and modify interventions until short term weight is achieved;Long Term: Adherence to nutrition and physical activity/exercise program aimed toward attainment of established weight goal;Weight Loss: Understanding of general recommendations for a balanced deficit meal plan, which promotes 1-2 lb weight loss per week and includes a negative energy balance of [phone] kcal/d;Understanding recommendations for meals to include 15-35% energy as protein, 25-35% energy from fat, 35-60% energy from carbohydrates, less than 200mg  of dietary cholesterol, 20-35 gm of total fiber daily;Understanding of distribution of calorie intake throughout the day with the consumption of 4-5 meals/snacks    Improve shortness of breath with ADL's Yes    Intervention Provide education, individualized exercise plan and daily activity instruction to help decrease symptoms of SOB with activities of daily living.    Expected Outcomes Short Term: Improve cardiorespiratory fitness to achieve a reduction of symptoms when performing ADLs;Long Term: Be able to perform more ADLs without symptoms or delay the onset of symptoms          Core Components/Risk Factors/Patient Goals Review:   Goals and Risk Factor Review     Row Name 05/08/24 1408 06/03/24 1704 06/26/24 1311         Core Components/Risk Factors/Patient Goals Review   Personal Goals Review Weight Management/Obesity;Improve shortness of breath with ADL's;Develop more efficient breathing techniques such as purse lipped breathing and diaphragmatic breathing and practicing self-pacing with activity. Weight Management/Obesity;Improve shortness of breath with ADL's Weight Management/Obesity;Improve shortness of breath with ADL's     Review Monthly review of patient's Core Components/Risk Factors/Patient Goals are as follows: Goal in progress for improving her shortness of breath with ADLs. Christine Barry had to be started on oxygen  to maintain her saturations. She recently  needed to increase to 3L with exertion. She has been exercising on the treadmill and NuStep. She has completed 7 sessions and is  making progress. Goal met on developing more efficient breathing techniques such as purse lipped breathing and diaphragmatic breathing; and practicing self-pacing with activity. Christine Barry can initiate pursed lip breathing and is practicing diaphragmatic breathing before bed. She can self-pace when needed and allows herself to take breaks while walking or exercising. Goal progressing on weight loss. Christine Barry has been working with our dietician on ways to incorporate the plate method and choosing a wide variety of foods. She is working on decreasing her caloric intake as well as eating out less. She is down ~1.8#. We will continue to monitor Christine Barry's progress throughout the program. Monthly review of patient's Core Components/Risk Factors/Patient Goals are as follows: Goal in progress for improving her shortness of breath with ADLs. Christine Barry had to be started on oxygen  to maintain her saturations. She recently needed to increase to 3L with exertion. She has been exercising on the treadmill and NuStep. She has completed 13 sessions and is making great progress. Goal progressing on weight loss. Christine Barry has been working with our dietician on ways to incorporate the plate method and choosing a wide variety of foods. She is working on decreasing her caloric intake as well as eating out less. Her weight has maintained since starting the program. We will continue to monitor Christine Barry's progress throughout the program. Monthly review of patient's Core Components/Risk Factors/Patient Goals are as follows: Goal in progress for improving her shortness of breath with ADLs. Christine Barry had to be started on oxygen  to maintain her saturations. She recently needed to increase to now 4L with exertion. She failed wearing her POC on 4L while she was walking the treadmill. Informed Christine Barry that she would need tanks to exercise but she  refused because she doesn't want to give up her POC. Goal progressing on weight loss. Christine Barry has been working with our dietician on ways to incorporate the plate method and choosing a wide variety of foods. She is working on decreasing her caloric intake as well as eating out less. Her weight has maintained since starting the program. We will continue to monitor Christine Barry's progress throughout the program.     Expected Outcomes Pt will show progress toward meeting expected goals and outcomes. Pt will show progress toward meeting expected goals and outcomes. Pt will show progress toward meeting expected goals and outcomes.        Core Components/Risk Factors/Patient Goals at Discharge (Final Review):   Goals and Risk Factor Review - 06/26/24 1311       Core Components/Risk Factors/Patient Goals Review   Personal Goals Review Weight Management/Obesity;Improve shortness of breath with ADL's    Review Monthly review of patient's Core Components/Risk Factors/Patient Goals are as follows: Goal in progress for improving her shortness of breath with ADLs. Christine Barry had to be started on oxygen  to maintain her saturations. She recently needed to increase to now 4L with exertion. She failed wearing her POC on 4L while she was walking the treadmill. Informed Christine Barry that she would need tanks to exercise but she refused because she doesn't want to give up her POC. Goal progressing on weight loss. Christine Barry has been working with our dietician on ways to incorporate the plate method and choosing a wide variety of foods. She is working on decreasing her caloric intake as well as eating out less. Her weight has maintained since starting the program. We will continue to monitor Christine Barry's progress throughout the program.    Expected Outcomes Pt will show progress toward meeting expected goals and outcomes.  ITP Comments:   Comments: Pt is making expected progress toward Pulmonary Rehab goals after completing 22 session(s).  Recommend continued exercise, life style modification, education, and utilization of breathing techniques to increase stamina and strength, while also decreasing shortness of breath with exertion.  Dr. Slater Staff is Medical Director for Pulmonary Rehab at [facility].

## 2024-07-09 ENCOUNTER — Encounter (HOSPITAL_COMMUNITY)
Admission: RE | Admit: 2024-07-09 | Discharge: 2024-07-09 | Disposition: A | Discharge status: Home / Self Care | Source: Ambulatory Visit | Attending: Pulmonary Disease | Admitting: Pulmonary Disease

## 2024-07-09 DIAGNOSIS — J9611 Chronic respiratory failure with hypoxia: Secondary | ICD-10-CM | POA: Insufficient documentation

## 2024-07-09 DIAGNOSIS — J432 Centrilobular emphysema: Secondary | ICD-10-CM | POA: Diagnosis not present

## 2024-07-09 NOTE — Progress Notes (Signed)
 Daily Session Note  Patient Details  Name: Shequita Peplinski MRN: [id number] Date of Birth: Dec 24, 1940 Referring Provider:   [name] Pulmonary Rehab Walk Test from 04/15/2024 in [facility]  Referring Provider McQuaid    Encounter Date: 07/09/2024  Check In:  Session Check In - 07/09/24 1320       Check-In   Supervising physician immediately available to respond to emergencies [facility] MD immediately available    Physician(s) Jackee Wyn Raddle, NP    Location [facility]    Staff Present Augustin Sharps, Candia Levin, RN, Maud Moats, MS, ACSM-CEP, Exercise Physiologist    Virtual Visit No    Medication changes reported     No    Fall or balance concerns reported    No    Tobacco Cessation No Change    Warm-up and Cool-down Performed as group-led instruction    Resistance Training Performed Yes    VAD Patient? No    PAD/SET Patient? No      Pain Assessment   Currently in Pain? No/denies    Multiple Pain Sites No          Capillary Blood Glucose: No results found for this or any previous visit (from the past 24 hours).    Social History   Tobacco Use  Smoking Status Former   Current packs/day: 0.00   Average packs/day: 2.0 packs/day for 38.7 years (77.5 ttl pk-yrs)   Types: Cigarettes   Start date: 10/08/1961   Quit date: 07/07/2000   Years since quitting: 24.0  Smokeless Tobacco Never    Goals Met:  Independence with exercise equipment Exercise tolerated well No report of concerns or symptoms today Strength training completed today  Goals Unmet:  Not Applicable  Comments: Service time is from 1308 to 1449    Dr. Slater Staff is Medical Director for Pulmonary Rehab at [facility].

## 2024-07-10 ENCOUNTER — Ambulatory Visit (INDEPENDENT_AMBULATORY_CARE_PROVIDER_SITE_OTHER): Admitting: Medical-Surgical

## 2024-07-10 ENCOUNTER — Encounter: Payer: Self-pay | Admitting: Medical-Surgical

## 2024-07-10 VITALS — BP 127/68 | HR 80 | Resp 20 | Ht 65.0 in | Wt 172.1 lb

## 2024-07-10 DIAGNOSIS — E559 Vitamin D deficiency, unspecified: Secondary | ICD-10-CM | POA: Diagnosis not present

## 2024-07-10 DIAGNOSIS — E890 Postprocedural hypothyroidism: Secondary | ICD-10-CM

## 2024-07-10 DIAGNOSIS — Z23 Encounter for immunization: Secondary | ICD-10-CM | POA: Diagnosis not present

## 2024-07-10 DIAGNOSIS — J432 Centrilobular emphysema: Secondary | ICD-10-CM

## 2024-07-10 DIAGNOSIS — E7800 Pure hypercholesterolemia, unspecified: Secondary | ICD-10-CM

## 2024-07-10 DIAGNOSIS — R7303 Prediabetes: Secondary | ICD-10-CM | POA: Diagnosis not present

## 2024-07-10 DIAGNOSIS — L989 Disorder of the skin and subcutaneous tissue, unspecified: Secondary | ICD-10-CM

## 2024-07-10 MED ORDER — MINOXIDIL 2.5 MG PO TABS: 1.2500 mg | ORAL_TABLET | Freq: Every day | ORAL | 3 refills | Status: AC

## 2024-07-10 MED ORDER — ERYTHROMYCIN 5 MG/GM OP OINT: 1.0000 | TOPICAL_OINTMENT | Freq: Every day | OPHTHALMIC | 0 refills | Status: DC

## 2024-07-10 NOTE — Assessment & Plan Note (Signed)
 Persistent lesion under glasses area, suspected to be a mole. Reviewed treatment options. - Perform cryotherapy on the facial lesion. - Consider dermatology referral if lesion persists or for cosmetic concerns.

## 2024-07-10 NOTE — Assessment & Plan Note (Signed)
 Hypothyroidism with recent switch in manufacturer of levothyroxine  200mcg daily. Last thyroid  function test was almost a year ago. - Order thyroid  function tests. - Continue levothyroxine  as prescribed.

## 2024-07-10 NOTE — Assessment & Plan Note (Signed)
 Chronic emphysema with improved lung function. Recent scan shows stable pulmonary nodules. - Continue pulmonary rehabilitation and regular exercise. - Use oxygen  at level 3 during exercise as needed. - Follow up with Pulmonology as instructed.

## 2024-07-10 NOTE — Assessment & Plan Note (Signed)
 Last A1c showed improvement to 5.5%. Participating in regular exercise and working to reduce carbohydrate intake.  - Rechecking A1c with labs today.

## 2024-07-10 NOTE — Assessment & Plan Note (Signed)
 Currently taking Vitamin D3 2000 units daily.  - Rechecking vitamin D levels.

## 2024-07-10 NOTE — Assessment & Plan Note (Signed)
 Currently managed with Simvastatin  40mg  daily, well tolerated.  - Checking lipids. - Continue Simvastatin .

## 2024-07-10 NOTE — Progress Notes (Signed)
 Established patient visit   History of Present Illness   Discussed the use of AI scribe software for clinical note transcription with the patient, who gave verbal consent to proceed.  History of Present Illness   Christine Barry is an 83 year old female who presents with a persistent facial lesion.  Persistent facial lesion - Facial lesion on left cheek beside the nose present since last spring, initially resembling a pimple - Lesion is persistent and has not resolved with attempts at squeezing or application of alcohol - No associated burning or pruritus - Lesion is bothersome to her - Applies sunscreen daily - Does not use makeup  Medication use and recent interventions - Current medications include minoxidil for hair growth  Thyroid  function and medication changes - Thyroid  function is due for re-evaluation - Recent change in thyroid  medication due to pharmacy issues     Physical Exam   Physical Exam Vitals reviewed.  Constitutional:      General: She is not in acute distress.    Appearance: Normal appearance. She is not ill-appearing.  HENT:     Head: Normocephalic and atraumatic.  Cardiovascular:     Rate and Rhythm: Normal rate and regular rhythm.     Pulses: Normal pulses.     Heart sounds: Normal heart sounds. No murmur heard.    No friction rub. No gallop.  Pulmonary:     Effort: Pulmonary effort is normal. No respiratory distress.     Breath sounds: Normal breath sounds. No wheezing.  Skin:    General: Skin is warm and dry.     Findings: Lesion (2mm flesh-toned lesion to the left cheek resembling a mole) present.  Neurological:     Mental Status: She is alert and oriented to person, place, and time.  Psychiatric:        Mood and Affect: Mood normal.        Behavior: Behavior normal.        Thought Content: Thought content normal.        Judgment: Judgment normal.    Assessment & Plan   Problem List Items Addressed This Visit        Respiratory   Centrilobular emphysema (HCC)   Chronic emphysema with improved lung function. Recent scan shows stable pulmonary nodules. - Continue pulmonary rehabilitation and regular exercise. - Use oxygen  at level 3 during exercise as needed. - Follow up with Pulmonology as instructed.       Relevant Orders   CBC     Endocrine   Postoperative hypothyroidism   Hypothyroidism with recent switch in manufacturer of levothyroxine  200mcg daily. Last thyroid  function test was almost a year ago. - Order thyroid  function tests. - Continue levothyroxine  as prescribed.      Relevant Orders   TSH     Musculoskeletal and Integument   Skin lesion of face - Primary   Persistent lesion under glasses area, suspected to be a mole. Reviewed treatment options. - Perform cryotherapy on the facial lesion. - Consider dermatology referral if lesion persists or for cosmetic concerns.        Other   Elevated cholesterol   Currently managed with Simvastatin  40mg  daily, well tolerated.  - Checking lipids. - Continue Simvastatin .      Relevant Medications   minoxidil (LONITEN) 2.5 MG tablet   Other Relevant Orders   CMP14+EGFR   Lipid panel   Prediabetes   Last A1c showed improvement to 5.5%.  Participating in regular exercise and working to reduce carbohydrate intake.  - Rechecking A1c with labs today.       Relevant Orders   Hemoglobin A1c   Vitamin D deficiency   Currently taking Vitamin D3 2000 units daily.  - Rechecking vitamin D levels.      Relevant Orders   VITAMIN D 25 Hydroxy (Vit-D Deficiency, Fractures)   Other Visit Diagnoses       Need for influenza vaccination       Relevant Orders   Flu vaccine HIGH DOSE PF(Fluzone Trivalent) (Completed)      Follow up   Return in about 6 months (around 01/08/2025) for chronic disease follow up. __________________________________ Zada FREDRIK Palin, DNP, APRN, FNP-BC Primary Care and Sports Medicine [facility]  [place]

## 2024-07-11 LAB — TSH: TSH: 0.273 u[IU]/mL — ABNORMAL LOW (ref 0.450–4.500)

## 2024-07-11 LAB — CBC
Hematocrit: 42 % (ref 34.0–46.6)
Hemoglobin: 13.6 g/dL (ref 11.1–15.9)
MCH: 30 pg (ref 26.6–33.0)
MCHC: 32.4 g/dL (ref 31.5–35.7)
MCV: 93 fL (ref 79–97)
Platelets: 162 x10E3/uL (ref 150–450)
RBC: 4.54 x10E6/uL (ref 3.77–5.28)
RDW: 12.6 % (ref 11.7–15.4)
WBC: 7.1 x10E3/uL (ref 3.4–10.8)

## 2024-07-11 LAB — CMP14+EGFR
ALT: 21 IU/L (ref 0–32)
AST: 26 IU/L (ref 0–40)
Albumin: 4.3 g/dL (ref 3.7–4.7)
Alkaline Phosphatase: 67 IU/L (ref 48–129)
BUN/Creatinine Ratio: 31 — ABNORMAL HIGH (ref 12–28)
BUN: 26 mg/dL (ref 8–27)
Bilirubin Total: 0.2 mg/dL (ref 0.0–1.2)
CO2: 23 mmol/L (ref 20–29)
Calcium: 9.4 mg/dL (ref 8.7–10.3)
Chloride: 99 mmol/L (ref 96–106)
Creatinine, Ser: 0.84 mg/dL (ref 0.57–1.00)
Globulin, Total: 2.4 g/dL (ref 1.5–4.5)
Glucose: 105 mg/dL — ABNORMAL HIGH (ref 70–99)
Potassium: 5.2 mmol/L (ref 3.5–5.2)
Sodium: 138 mmol/L (ref 134–144)
Total Protein: 6.7 g/dL (ref 6.0–8.5)
eGFR: 70 mL/min/1.73 (ref >59)

## 2024-07-11 LAB — HEMOGLOBIN A1C
Est. average glucose Bld gHb Est-mCnc: 123 mg/dL
Hgb A1c MFr Bld: 5.9 % — ABNORMAL HIGH (ref 4.8–5.6)

## 2024-07-11 LAB — LIPID PANEL
Chol/HDL Ratio: 2.3 ratio (ref 0.0–4.4)
Cholesterol, Total: 195 mg/dL (ref 100–199)
HDL: 83 mg/dL (ref >39)
LDL Chol Calc (NIH): 88 mg/dL (ref 0–99)
Triglycerides: 139 mg/dL (ref 0–149)
VLDL Cholesterol Cal: 24 mg/dL (ref 5–40)

## 2024-07-11 LAB — VITAMIN D 25 HYDROXY (VIT D DEFICIENCY, FRACTURES): Vit D, 25-Hydroxy: 55.9 ng/mL (ref 30.0–100.0)

## 2024-07-13 ENCOUNTER — Ambulatory Visit: Payer: Self-pay | Admitting: Medical-Surgical

## 2024-07-14 ENCOUNTER — Encounter (HOSPITAL_COMMUNITY)
Admission: RE | Admit: 2024-07-14 | Discharge: 2024-07-14 | Disposition: A | Discharge status: Home / Self Care | Source: Ambulatory Visit | Attending: Pulmonary Disease | Admitting: Pulmonary Disease

## 2024-07-14 DIAGNOSIS — J9611 Chronic respiratory failure with hypoxia: Secondary | ICD-10-CM | POA: Diagnosis not present

## 2024-07-14 DIAGNOSIS — J432 Centrilobular emphysema: Secondary | ICD-10-CM

## 2024-07-14 NOTE — Telephone Encounter (Signed)
 A prior authorization is required for the antibiotic eye drops. Thanks in advance.

## 2024-07-14 NOTE — Progress Notes (Signed)
 Daily Session Note  Patient Details  Name: Kenzlie Disch MRN: [id number] Date of Birth: 07/06/1941 Referring Provider:   [name] Pulmonary Rehab Walk Test from 04/15/2024 in [facility]  Referring Provider McQuaid    Encounter Date: 07/14/2024  Check In:  Session Check In - 07/14/24 1417       Check-In   Supervising physician immediately available to respond to emergencies [facility] MD immediately available    Physician(s) Barnie Press, NP    Location [facility]    Staff Present Augustin Sharps, Candia Levin, RN, Maud Moats, MS, ACSM-CEP, Exercise Physiologist    Virtual Visit No    Medication changes reported     No    Fall or balance concerns reported    No    Tobacco Cessation No Change    Warm-up and Cool-down Performed as group-led instruction    Resistance Training Performed Yes    VAD Patient? No      Pain Assessment   Currently in Pain? No/denies    Multiple Pain Sites No          Capillary Blood Glucose: No results found for this or any previous visit (from the past 24 hours).    Social History   Tobacco Use  Smoking Status Former   Current packs/day: 0.00   Average packs/day: 2.0 packs/day for 38.7 years (77.5 ttl pk-yrs)   Types: Cigarettes   Start date: 10/08/1961   Quit date: 07/07/2000   Years since quitting: 24.0  Smokeless Tobacco Never    Goals Met:  Proper associated with RPD/PD & O2 Sat Exercise tolerated well No report of concerns or symptoms today Strength training completed today  Goals Unmet:  Not Applicable  Comments: Service time is from 1314 to 1442.    Dr. Slater Staff is Medical Director for Pulmonary Rehab at [facility].

## 2024-07-15 ENCOUNTER — Other Ambulatory Visit (HOSPITAL_COMMUNITY): Payer: Self-pay

## 2024-07-15 ENCOUNTER — Telehealth: Payer: Self-pay | Admitting: Medical-Surgical

## 2024-07-15 DIAGNOSIS — J449 Chronic obstructive pulmonary disease, unspecified: Secondary | ICD-10-CM | POA: Diagnosis not present

## 2024-07-15 DIAGNOSIS — J9611 Chronic respiratory failure with hypoxia: Secondary | ICD-10-CM | POA: Diagnosis not present

## 2024-07-15 NOTE — Telephone Encounter (Unsigned)
 Copied from CRM #[id number]. Topic: General - Other >> Jul 15, 2024  5:32 PM Donee H wrote: Reason for CRM: Patient stated she believes she received a message through MyChart that was meant for someone else and not her regarding a medication refill request. Please follow up with patient.

## 2024-07-15 NOTE — Progress Notes (Signed)
 Discharge Progress Report  Patient Details  Name: Christine Barry MRN: [id number] Date of Birth: 05/10/1941 Referring Provider:   [name] Walk Test from 04/15/2024 in [facility]  Referring Provider McQuaid     Number of Visits: 24  Reason for Discharge:  Patient has met program and personal goals.  Smoking History:  Social History   Tobacco Use  Smoking Status Former   Current packs/day: 0.00   Average packs/day: 2.0 packs/day for 38.7 years (77.5 ttl pk-yrs)   Types: Cigarettes   Start date: 10/08/1961   Quit date: 07/07/2000   Years since quitting: 24.0  Smokeless Tobacco Never    Diagnosis:  Chronic respiratory failure with hypoxia (HCC)  Centrilobular emphysema (HCC)  ADL [facility]:  Pulmonary Assessment Scores     Row Name 04/15/24 [phone] 07/02/24 1531       ADL [facility]   ADL Phase Entry Exit    SOB Score total 34 37      CAT Score   CAT Score 15 16      mMRC Score   mMRC Score 3 2       Initial Exercise Prescription:  Initial Exercise Prescription - 04/15/24 1000       Date of Initial Exercise RX and Referring Provider   Date 04/15/24    Referring Provider McQuaid    Expected Discharge Date 07/09/24      Oxygen    Oxygen  Continuous    Liters 2    Maintain Oxygen  Saturation 88% or higher      Treadmill   MPH 1.5    Grade 0    Minutes 15    METs 2      NuStep   Level 1    SPM 60    Minutes 15    METs 1.5      Prescription Details   Frequency (times per week) 2    Duration Progress to 30 minutes of continuous aerobic without signs/symptoms of physical distress      Intensity   THRR 40-80% of Max Heartrate 56-11    Ratings of Perceived Exertion 11-13    Perceived Dyspnea 0-4      Progression   Progression Continue to progress workloads to maintain intensity without signs/symptoms of physical distress.      Resistance Training   Training Prescription Yes     Weight red bands    Reps 10-15          Discharge Exercise Prescription (Final Exercise Prescription Changes):  Exercise Prescription Changes - 07/07/24 1400       Response to Exercise   Blood Pressure (Admit) 126/70    Blood Pressure (Exercise) 138/70    Blood Pressure (Exit) 124/70    Heart Rate (Admit) 96 bpm    Heart Rate (Exercise) 118 bpm    Heart Rate (Exit) 105 bpm    Oxygen  Saturation (Admit) 91 %   RA   Oxygen  Saturation (Exercise) 89 %   3L   Oxygen  Saturation (Exit) 94 %   RA   Rating of Perceived Exertion (Exercise) 13    Perceived Dyspnea (Exercise) 3    Duration Continue with 30 min of aerobic exercise without signs/symptoms of physical distress.    Intensity THRR unchanged      Progression   Progression Continue to progress workloads to maintain intensity without signs/symptoms of physical distress.      Paramedic Prescription  Yes    Weight blue bands    Reps 10-15    Time 10 Minutes      Oxygen    Oxygen  Continuous    Liters 2-4      Treadmill   MPH 1.9    Grade 1.5    Minutes 15    METs 2.5      NuStep   Level 6    SPM 58    Minutes 15    METs 2.9      Oxygen    Maintain Oxygen  Saturation 88% or higher          Functional Capacity:  6 Minute Walk     Row Name 04/15/24 1056 07/02/24 1535       6 Minute Walk   Phase Initial Discharge    Distance 985 feet 1200 feet    Distance % Change -- 21.38 %    Distance Feet Change -- 215 ft    Walk Time 6 minutes 6 minutes    # of Rest Breaks 1  2:40-2:50 0    MPH 1.87 2.18    METS 1.93 2.44    RPE 11 13    Perceived Dyspnea  1 1    VO2 Peak 6.75 8.54    Symptoms No Yes (comment)    Comments -- DOE    Resting HR 89 bpm 95 bpm    Resting BP 124/58 130/60    Resting Oxygen  Saturation  92 % 93 %    Exercise Oxygen  Saturation  during 6 min walk 87 % 87 %    Max Ex. HR 105 bpm 122 bpm    Max Ex. BP 160/80 160/60    2 Minute Post BP 120/80 130/76      Interval  HR   1 Minute HR 90 104    2 Minute HR 101 108    3 Minute HR 90 114    4 Minute HR 93 116    5 Minute HR 100 119    6 Minute HR 105 122    2 Minute Post HR 86 105    Interval Heart Rate? Yes --      Interval Oxygen    Interval Oxygen ? Yes --    Baseline Oxygen  Saturation % 92 % 93 %    1 Minute Oxygen  Saturation % 93 % 93 %    1 Minute Liters of Oxygen  0 L 2 L    2 Minute Oxygen  Saturation % 87 % 90 %    2 Minute Liters of Oxygen  0 L  increased to 1L 2 L    3 Minute Oxygen  Saturation % 89 %  87% at 2:40 87 %    3 Minute Liters of Oxygen  1 L  increased to 2L 2 L    4 Minute Oxygen  Saturation % 94 % 88 %    4 Minute Liters of Oxygen  2 L 3 L    5 Minute Oxygen  Saturation % 93 % 89 %    5 Minute Liters of Oxygen  2 L 3 L    6 Minute Oxygen  Saturation % 90 % 87 %    6 Minute Liters of Oxygen  2 L 3 L    2 Minute Post Oxygen  Saturation % 95 % 96 %    2 Minute Post Liters of Oxygen  2 L 3 L       Psychological, QOL, Others - Outcomes: PHQ 2/9:    07/10/2024    4:45 PM 07/02/2024  3:30 PM 04/21/2024    2:18 PM 04/15/2024    9:45 AM 05/14/2023    3:15 PM  Depression screen PHQ 2/9  Decreased Interest 0 0 0 0 0  Down, Depressed, Hopeless 0 0 0 1 0  PHQ - 2 Score 0 0 0 1 0  Altered sleeping 0 0  0   Tired, decreased energy 0 0  0   Change in appetite 0 0  0   Feeling bad or failure about yourself  0 0  0   Trouble concentrating 3 3  1    Moving slowly or fidgety/restless 0 0  0   Suicidal thoughts 0 0  0   PHQ-9 Score 3 3  2    Difficult doing work/chores Not difficult at all Not difficult at all  Not difficult at all     Quality of Life:   Personal Goals: Goals established at orientation with interventions provided to work toward goal.  Personal Goals and Risk Factors at Admission - 04/15/24 0938       Core Components/Risk Factors/Patient Goals on Admission    Weight Management Yes;Weight Loss    Intervention Weight Management/Obesity: Establish reasonable short term and  long term weight goals.;Weight Management: Provide education and appropriate resources to help participant work on and attain dietary goals.;Obesity: Provide education and appropriate resources to help participant work on and attain dietary goals.;Weight Management: Develop a combined nutrition and exercise program designed to reach desired caloric intake, while maintaining appropriate intake of nutrient and fiber, sodium and fats, and appropriate energy expenditure required for the weight goal.    Admit Weight 175 lb 7.8 oz (79.6 kg)    Expected Outcomes Short Term: Continue to assess and modify interventions until short term weight is achieved;Long Term: Adherence to nutrition and physical activity/exercise program aimed toward attainment of established weight goal;Weight Loss: Understanding of general recommendations for a balanced deficit meal plan, which promotes 1-2 lb weight loss per week and includes a negative energy balance of [phone] kcal/d;Understanding recommendations for meals to include 15-35% energy as protein, 25-35% energy from fat, 35-60% energy from carbohydrates, less than 200mg  of dietary cholesterol, 20-35 gm of total fiber daily;Understanding of distribution of calorie intake throughout the day with the consumption of 4-5 meals/snacks    Improve shortness of breath with ADL's Yes    Intervention Provide education, individualized exercise plan and daily activity instruction to help decrease symptoms of SOB with activities of daily living.    Expected Outcomes Short Term: Improve cardiorespiratory fitness to achieve a reduction of symptoms when performing ADLs;Long Term: Be able to perform more ADLs without symptoms or delay the onset of symptoms           Personal Goals Discharge:  Goals and Risk Factor Review     Row Name 05/08/24 1408 06/03/24 1704 06/26/24 1311 07/15/24 1100       Core Components/Risk Factors/Patient Goals Review   Personal Goals Review Weight  Management/Obesity;Improve shortness of breath with ADL's;Develop more efficient breathing techniques such as purse lipped breathing and diaphragmatic breathing and practicing self-pacing with activity. Weight Management/Obesity;Improve shortness of breath with ADL's Weight Management/Obesity;Improve shortness of breath with ADL's Weight Management/Obesity;Improve shortness of breath with ADL's    Review Monthly review of patient's Core Components/Risk Factors/Patient Goals are as follows: Goal in progress for improving her shortness of breath with ADLs. Christine Barry had to be started on oxygen  to maintain her saturations. She recently needed to increase to 3L with exertion. She has  been exercising on the treadmill and NuStep. She has completed 7 sessions and is making progress. Goal met on developing more efficient breathing techniques such as purse lipped breathing and diaphragmatic breathing; and practicing self-pacing with activity. Christine Barry can initiate pursed lip breathing and is practicing diaphragmatic breathing before bed. She can self-pace when needed and allows herself to take breaks while walking or exercising. Goal progressing on weight loss. Christine Barry has been working with our dietician on ways to incorporate the plate method and choosing a wide variety of foods. She is working on decreasing her caloric intake as well as eating out less. She is down ~1.8#. We will continue to monitor Christine Barry's progress throughout the program. Monthly review of patient's Core Components/Risk Factors/Patient Goals are as follows: Goal in progress for improving her shortness of breath with ADLs. Christine Barry had to be started on oxygen  to maintain her saturations. She recently needed to increase to 3L with exertion. She has been exercising on the treadmill and NuStep. She has completed 13 sessions and is making great progress. Goal progressing on weight loss. Christine Barry has been working with our dietician on ways to incorporate the plate method and  choosing a wide variety of foods. She is working on decreasing her caloric intake as well as eating out less. Her weight has maintained since starting the program. We will continue to monitor Christine Barry's progress throughout the program. Monthly review of patient's Core Components/Risk Factors/Patient Goals are as follows: Goal in progress for improving her shortness of breath with ADLs. Christine Barry had to be started on oxygen  to maintain her saturations. She recently needed to increase to now 4L with exertion. She failed wearing her POC on 4L while she was walking the treadmill. Informed Christine Barry that she would need tanks to exercise but she refused because she doesn't want to give up her POC. Goal progressing on weight loss. Christine Barry has been working with our dietician on ways to incorporate the plate method and choosing a wide variety of foods. She is working on decreasing her caloric intake as well as eating out less. Her weight has maintained since starting the program. We will continue to monitor Christine Barry's progress throughout the program. Christine Barry gradutaed from the [facility] program on 07/14/24. Christine Barry met her goal for weight loss. She is down 3#'s at the time of graduation. She also met her goal for improving shortness of breath with ADL's. She states she can do more around the house with less SOB. Also, her MMRC score decreased from 3 to 2. Christine Barry was a pleasure to have in class and worked hard throughout the program.    Expected Outcomes Pt will show progress toward meeting expected goals and outcomes. Pt will show progress toward meeting expected goals and outcomes. Pt will show progress toward meeting expected goals and outcomes. To continue to exercise and modify her nutrition and lifestyle post graduation       Exercise Goals and Review:  Exercise Goals     Row Name 04/15/24 1105             Exercise Goals   Increase Physical Activity Yes       Intervention Provide advice, education, support and counseling about  physical activity/exercise needs.;Develop an individualized exercise prescription for aerobic and resistive training based on initial evaluation findings, risk stratification, comorbidities and participant's personal goals.       Expected Outcomes Short Term: Attend rehab on a regular basis to increase amount of physical activity.;Long Term: Add in home exercise to  make exercise part of routine and to increase amount of physical activity.;Long Term: Exercising regularly at least 3-5 days a week.       Increase Strength and Stamina Yes       Intervention Provide advice, education, support and counseling about physical activity/exercise needs.;Develop an individualized exercise prescription for aerobic and resistive training based on initial evaluation findings, risk stratification, comorbidities and participant's personal goals.       Expected Outcomes Short Term: Increase workloads from initial exercise prescription for resistance, speed, and METs.;Short Term: Perform resistance training exercises routinely during rehab and add in resistance training at home;Long Term: Improve cardiorespiratory fitness, muscular endurance and strength as measured by increased METs and functional capacity ( )       Able to understand and use rate of perceived exertion (RPE) scale Yes       Intervention Provide education and explanation on how to use RPE scale       Expected Outcomes Short Term: Able to use RPE daily in rehab to express subjective intensity level;Long Term:  Able to use RPE to guide intensity level when exercising independently       Able to understand and use Dyspnea scale Yes       Intervention Provide education and explanation on how to use Dyspnea scale       Expected Outcomes Short Term: Able to use Dyspnea scale daily in rehab to express subjective sense of shortness of breath during exertion;Long Term: Able to use Dyspnea scale to guide intensity level when exercising independently       Knowledge  and understanding of Target Heart Rate Range (THRR) Yes       Intervention Provide education and explanation of THRR including how the numbers were predicted and where they are located for reference       Expected Outcomes Short Term: Able to state/look up THRR;Long Term: Able to use THRR to govern intensity when exercising independently;Short Term: Able to use daily as guideline for intensity in rehab       Understanding of Exercise Prescription Yes       Intervention Provide education, explanation, and written materials on patient's individual exercise prescription       Expected Outcomes Short Term: Able to explain program exercise prescription;Long Term: Able to explain home exercise prescription to exercise independently          Exercise Goals Re-Evaluation:  Exercise Goals Re-Evaluation     Row Name 04/15/24 1105 05/07/24 0854 06/02/24 0848 06/30/24 0820 07/15/24 1042     Exercise Goal Re-Evaluation   Exercise Goals Review Increase Physical Activity;Able to understand and use Dyspnea scale;Understanding of Exercise Prescription;Increase Strength and Stamina;Knowledge and understanding of Target Heart Rate Range (THRR);Able to understand and use rate of perceived exertion (RPE) scale Increase Physical Activity;Able to understand and use Dyspnea scale;Understanding of Exercise Prescription;Increase Strength and Stamina;Knowledge and understanding of Target Heart Rate Range (THRR);Able to understand and use rate of perceived exertion (RPE) scale Increase Physical Activity;Able to understand and use Dyspnea scale;Understanding of Exercise Prescription;Increase Strength and Stamina;Knowledge and understanding of Target Heart Rate Range (THRR);Able to understand and use rate of perceived exertion (RPE) scale Increase Physical Activity;Able to understand and use Dyspnea scale;Understanding of Exercise Prescription;Increase Strength and Stamina;Knowledge and understanding of Target Heart Rate Range  (THRR);Able to understand and use rate of perceived exertion (RPE) scale Increase Physical Activity;Able to understand and use Dyspnea scale;Understanding of Exercise Prescription;Increase Strength and Stamina;Knowledge and understanding of Target Heart Rate Range (THRR);Able  to understand and use rate of perceived exertion (RPE) scale   Comments Christine Barry is scheduled to begin exercise on 7/15. Will continue to monitor and progress as able. Christine Barry has completed 5 exercise sessions with good attendance. She is walking on the treadmill for 15 min, 1.5 mph, 1% incline, METs 2.2. She then is exercising on the recumbent stepper for 15 min, level 3 now, METs 2.2. She performs warm up and cool down unlimited, using red bands. Will progress to blue, 5.8 lbs. Will continue to monitor and progress as able. Christine Barry has completed 12 exercise sessions with good attendance. She is walking on the treadmill for 15 min, 1.7 mph, 1.5% incline, METs 2.65. She then is exercising on the recumbent stepper for 15 min, level 5 now, METs 2.8. She performs warm up and cool down unlimited, using blue bands, 5.8 lbs. Will continue to monitor and progress as able. She is motivated. Christine Barry has completed 20 exercise sessions with good attendance. She is walking on the treadmill for 15 min, 1.9 mph, 1.5% incline, METs 2.65. She then is exercising on the recumbent stepper for 15 min, level 6, METs 2.9-3. She performs warm up and cool down unlimited, using blue bands, 5.8 lbs. Will continue to monitor and progress as able. She is set to graduate 10/7 and plans to exercise at the [facility] if she has enough O2. Christine Barry has completed 24 exercise sessions. Her peak METs were 2.6 on the treadmill and 3.1 on the Nustep. She plans to continue exercise at [facility] in their membership program. I am confident in [place] exercising on her own.   Expected Outcomes i. Through exercise at rehab and home, the patient will decrease shortness of breath with  daily activities and feel confident in carrying out an exercise regimen at home. i. Through exercise at rehab and home, the patient will decrease shortness of breath with daily activities and feel confident in carrying out an exercise regimen at home. Through exercise at rehab and home, the patient will decrease shortness of breath with daily activities and feel confident in carrying out an exercise regimen at home. Through exercise at rehab and home, the patient will decrease shortness of breath with daily activities and feel confident in carrying out an exercise regimen at home. Through exercise at rehab and home, the patient will decrease shortness of breath with daily activities and feel confident in carrying out an exercise regimen at home.      Nutrition & Weight - Outcomes:  Pre Biometrics - 04/15/24 0919       Pre Biometrics   Grip Strength 10 kg           Nutrition:  Nutrition Therapy & Goals - 05/21/24 1438       Nutrition Therapy   Diet General Healthy Diet    Drug/Food Interactions Statins/Certain Fruits      Personal Nutrition Goals   Nutrition Goal Patient to continue diet quality by using the plate method as a guide for meal planning to include lean protein/plant protein, fruits, vegetables, whole grains, nonfat dairy as part of a well-balanced diet.    Comments Goal in progress. Christine Barry has medical history of COPD. high cholesterol. A1c and lipids are well controlled. BMI is appropriate for age. She has maintained her weight since starting with our program.  Patient will benefit from participation in pulmonary rehab for nutrition education, exercise, and lifestyle modification.      Intervention Plan   Intervention Prescribe, educate and  counsel regarding individualized specific dietary modifications aiming towards targeted core components such as weight, hypertension, lipid management, diabetes, heart failure and other comorbidities.;Nutrition handout(s) given to patient.     Expected Outcomes Short Term Goal: Understand basic principles of dietary content, such as calories, fat, sodium, cholesterol and nutrients.;Long Term Goal: Adherence to prescribed nutrition plan.          Nutrition Discharge:  Nutrition Assessments - 07/02/24 1533       Rate Your Plate Scores   Post Score 67          Education Questionnaire Score:  Knowledge Questionnaire Score - 07/02/24 1532       Knowledge Questionnaire Score   Post Score 18/18          Goals reviewed with patient; copy given to patient.

## 2024-07-16 MED ORDER — ERYTHROMYCIN 5 MG/GM OP OINT: TOPICAL_OINTMENT | OPHTHALMIC | 0 refills | Status: AC

## 2024-07-16 NOTE — Addendum Note (Signed)
 Addended byBETHA WILLO MINI on: 07/16/2024 11:34 AM   Modules accepted: Orders

## 2024-07-17 NOTE — Telephone Encounter (Signed)
 She has the medication. All has been resolved.

## 2024-07-20 DIAGNOSIS — R918 Other nonspecific abnormal finding of lung field: Secondary | ICD-10-CM | POA: Diagnosis not present

## 2024-07-20 DIAGNOSIS — J432 Centrilobular emphysema: Secondary | ICD-10-CM | POA: Diagnosis not present

## 2024-07-21 DIAGNOSIS — J9611 Chronic respiratory failure with hypoxia: Secondary | ICD-10-CM | POA: Diagnosis not present

## 2024-08-13 ENCOUNTER — Ambulatory Visit: Admitting: Medical-Surgical

## 2024-08-15 DIAGNOSIS — J9611 Chronic respiratory failure with hypoxia: Secondary | ICD-10-CM | POA: Diagnosis not present

## 2024-08-20 ENCOUNTER — Other Ambulatory Visit: Payer: Self-pay | Admitting: Medical-Surgical

## 2024-08-20 DIAGNOSIS — E7800 Pure hypercholesterolemia, unspecified: Secondary | ICD-10-CM

## 2024-08-20 DIAGNOSIS — E890 Postprocedural hypothyroidism: Secondary | ICD-10-CM | POA: Diagnosis not present

## 2024-08-21 ENCOUNTER — Ambulatory Visit: Payer: Self-pay | Admitting: Medical-Surgical

## 2024-08-21 ENCOUNTER — Encounter: Payer: Self-pay | Admitting: Medical-Surgical

## 2024-08-21 DIAGNOSIS — J9611 Chronic respiratory failure with hypoxia: Secondary | ICD-10-CM | POA: Diagnosis not present

## 2024-08-21 DIAGNOSIS — E890 Postprocedural hypothyroidism: Secondary | ICD-10-CM

## 2024-08-21 LAB — TSH: TSH: 2.12 u[IU]/mL (ref 0.450–4.500)

## 2024-08-21 MED ORDER — LEVOTHYROXINE SODIUM 200 MCG PO TABS: 200.0000 ug | ORAL_TABLET | Freq: Every day | ORAL | 1 refills | Status: AC

## 2024-09-14 DIAGNOSIS — J9611 Chronic respiratory failure with hypoxia: Secondary | ICD-10-CM | POA: Diagnosis not present

## 2024-09-20 DIAGNOSIS — J9611 Chronic respiratory failure with hypoxia: Secondary | ICD-10-CM | POA: Diagnosis not present

## 2024-10-10 ENCOUNTER — Encounter: Payer: Self-pay | Admitting: Medical-Surgical

## 2024-10-12 MED ORDER — IPRATROPIUM BROMIDE 0.03 % NA SOLN: 1.0000 | Freq: Four times a day (QID) | NASAL | 2 refills | Status: AC | PRN

## 2024-10-17 ENCOUNTER — Other Ambulatory Visit: Payer: Self-pay | Admitting: Physician Assistant

## 2024-10-17 DIAGNOSIS — R600 Localized edema: Secondary | ICD-10-CM

## 2024-10-19 MED ORDER — FUROSEMIDE 20 MG PO TABS: 20.0000 mg | ORAL_TABLET | Freq: Every day | ORAL | 2 refills | Status: AC

## 2024-10-19 NOTE — Addendum Note (Signed)
 Addended by: Dosha Broshears H on: 10/19/2024 08:47 AM   Modules accepted: Orders

## 2024-10-19 NOTE — Addendum Note (Signed)
 Addended byBETHA WILLO MINI on: 10/19/2024 12:25 PM   Modules accepted: Orders

## 2024-10-27 ENCOUNTER — Other Ambulatory Visit (HOSPITAL_BASED_OUTPATIENT_CLINIC_OR_DEPARTMENT_OTHER): Payer: Self-pay | Admitting: Medical-Surgical

## 2024-10-27 DIAGNOSIS — Z1231 Encounter for screening mammogram for malignant neoplasm of breast: Secondary | ICD-10-CM

## 2024-11-03 ENCOUNTER — Ambulatory Visit (HOSPITAL_BASED_OUTPATIENT_CLINIC_OR_DEPARTMENT_OTHER)

## 2024-11-05 ENCOUNTER — Other Ambulatory Visit: Payer: Self-pay | Admitting: Medical-Surgical

## 2024-11-05 DIAGNOSIS — J302 Other seasonal allergic rhinitis: Secondary | ICD-10-CM

## 2024-11-05 DIAGNOSIS — J432 Centrilobular emphysema: Secondary | ICD-10-CM

## 2024-11-10 ENCOUNTER — Ambulatory Visit (HOSPITAL_BASED_OUTPATIENT_CLINIC_OR_DEPARTMENT_OTHER)
Admission: RE | Admit: 2024-11-10 | Discharge: 2024-11-10 | Disposition: A | Source: Ambulatory Visit | Attending: Medical-Surgical

## 2024-11-10 ENCOUNTER — Encounter (HOSPITAL_BASED_OUTPATIENT_CLINIC_OR_DEPARTMENT_OTHER): Payer: Self-pay

## 2024-11-10 DIAGNOSIS — Z1231 Encounter for screening mammogram for malignant neoplasm of breast: Secondary | ICD-10-CM

## 2024-11-12 ENCOUNTER — Ambulatory Visit: Payer: Self-pay | Admitting: Medical-Surgical

## 2025-01-07 ENCOUNTER — Ambulatory Visit: Admitting: Medical-Surgical

## 2025-04-22 ENCOUNTER — Ambulatory Visit
